# Patient Record
Sex: Male | Born: 1955
Health system: Southern US, Community
[De-identification: ages and names within clinical notes are randomized; demographics above are authoritative.]

## PROBLEM LIST (undated history)

## (undated) DIAGNOSIS — Z860100 Personal history of colon polyps, unspecified: Secondary | ICD-10-CM

## (undated) DIAGNOSIS — C4492 Squamous cell carcinoma of skin, unspecified: Secondary | ICD-10-CM

## (undated) DIAGNOSIS — K219 Gastro-esophageal reflux disease without esophagitis: Secondary | ICD-10-CM

## (undated) DIAGNOSIS — R945 Abnormal results of liver function studies: Secondary | ICD-10-CM

## (undated) DIAGNOSIS — C4491 Basal cell carcinoma of skin, unspecified: Secondary | ICD-10-CM

## (undated) DIAGNOSIS — F419 Anxiety disorder, unspecified: Secondary | ICD-10-CM

## (undated) DIAGNOSIS — K2270 Barrett's esophagus without dysplasia: Secondary | ICD-10-CM

## (undated) DIAGNOSIS — R7989 Other specified abnormal findings of blood chemistry: Secondary | ICD-10-CM

## (undated) DIAGNOSIS — K449 Diaphragmatic hernia without obstruction or gangrene: Secondary | ICD-10-CM

## (undated) DIAGNOSIS — Z8719 Personal history of other diseases of the digestive system: Secondary | ICD-10-CM

## (undated) DIAGNOSIS — R972 Elevated prostate specific antigen [PSA]: Secondary | ICD-10-CM

## (undated) DIAGNOSIS — I739 Peripheral vascular disease, unspecified: Secondary | ICD-10-CM

## (undated) DIAGNOSIS — C61 Malignant neoplasm of prostate: Secondary | ICD-10-CM

## (undated) HISTORY — DX: Barrett's esophagus without dysplasia: K22.70

## (undated) HISTORY — DX: Abnormal results of liver function studies: R94.5

## (undated) HISTORY — DX: Peripheral vascular disease, unspecified: I73.9

## (undated) HISTORY — DX: Malignant neoplasm of prostate: C61

## (undated) HISTORY — PX: LIVER BIOPSY: SHX301

## (undated) HISTORY — DX: Personal history of other diseases of the digestive system: Z87.19

## (undated) HISTORY — DX: Personal history of colonic polyps: Z86.010

## (undated) HISTORY — DX: Personal history of colon polyps, unspecified: Z86.0100

## (undated) HISTORY — DX: Elevated prostate specific antigen (PSA): R97.20

## (undated) HISTORY — DX: Basal cell carcinoma of skin, unspecified: C44.91

## (undated) HISTORY — DX: Anxiety disorder, unspecified: F41.9

## (undated) HISTORY — DX: Gastro-esophageal reflux disease without esophagitis: K21.9

## (undated) HISTORY — DX: Squamous cell carcinoma of skin, unspecified: C44.92

## (undated) HISTORY — DX: Other specified abnormal findings of blood chemistry: R79.89

## (undated) HISTORY — DX: Diaphragmatic hernia without obstruction or gangrene: K44.9

---

## 2003-08-04 ENCOUNTER — Encounter: Admission: RE | Admit: 2003-08-04 | Discharge: 2003-08-04 | Payer: Self-pay | Admitting: Internal Medicine

## 2004-04-14 ENCOUNTER — Ambulatory Visit: Payer: Self-pay | Admitting: Gastroenterology

## 2004-04-19 ENCOUNTER — Encounter: Admission: RE | Admit: 2004-04-19 | Discharge: 2004-04-19 | Payer: Self-pay | Admitting: Gastroenterology

## 2004-04-27 ENCOUNTER — Ambulatory Visit: Payer: Self-pay | Admitting: Gastroenterology

## 2004-11-28 ENCOUNTER — Ambulatory Visit: Payer: Self-pay | Admitting: Gastroenterology

## 2006-03-27 ENCOUNTER — Ambulatory Visit: Payer: Self-pay | Admitting: Internal Medicine

## 2006-04-11 ENCOUNTER — Ambulatory Visit: Payer: Self-pay | Admitting: Gastroenterology

## 2006-05-07 ENCOUNTER — Ambulatory Visit: Payer: Self-pay | Admitting: Gastroenterology

## 2006-05-21 ENCOUNTER — Encounter (INDEPENDENT_AMBULATORY_CARE_PROVIDER_SITE_OTHER): Payer: Self-pay | Admitting: Specialist

## 2006-05-21 ENCOUNTER — Ambulatory Visit: Payer: Self-pay | Admitting: Gastroenterology

## 2006-05-21 DIAGNOSIS — K5730 Diverticulosis of large intestine without perforation or abscess without bleeding: Secondary | ICD-10-CM

## 2006-05-29 ENCOUNTER — Ambulatory Visit: Payer: Self-pay | Admitting: Family Medicine

## 2006-07-10 ENCOUNTER — Ambulatory Visit: Payer: Self-pay | Admitting: Family Medicine

## 2006-07-10 LAB — CONVERTED CEMR LAB
ALT: 94 units/L — ABNORMAL HIGH (ref 0–40)
AST: 57 units/L — ABNORMAL HIGH (ref 0–37)
Albumin: 3.8 g/dL (ref 3.5–5.2)
Alkaline Phosphatase: 134 units/L — ABNORMAL HIGH (ref 39–117)
BUN: 6 mg/dL (ref 6–23)
Bilirubin, Direct: 0.1 mg/dL (ref 0.0–0.3)
CO2: 31 meq/L (ref 19–32)
Calcium: 9.2 mg/dL (ref 8.4–10.5)
Chloride: 110 meq/L (ref 96–112)
Cholesterol: 226 mg/dL (ref 0–200)
Creatinine, Ser: 0.8 mg/dL (ref 0.4–1.5)
Direct LDL: 168.6 mg/dL
GFR calc Af Amer: 131 mL/min
GFR calc non Af Amer: 108 mL/min
Glucose, Bld: 97 mg/dL (ref 70–99)
HDL: 42.2 mg/dL (ref >39.0)
PSA: 2.24 ng/mL (ref 0.10–4.00)
Potassium: 4.3 meq/L (ref 3.5–5.1)
Sodium: 144 meq/L (ref 135–145)
TSH: 1.39 microintl units/mL (ref 0.35–5.50)
Total Bilirubin: 0.8 mg/dL (ref 0.3–1.2)
Total CHOL/HDL Ratio: 5.4
Total Protein: 6.7 g/dL (ref 6.0–8.3)
Triglycerides: 111 mg/dL (ref 0–149)
VLDL: 22 mg/dL (ref 0–40)

## 2006-07-30 ENCOUNTER — Ambulatory Visit: Payer: Self-pay | Admitting: Family Medicine

## 2006-07-30 LAB — CONVERTED CEMR LAB
ALT: 47 units/L — ABNORMAL HIGH (ref 0–40)
AST: 31 units/L (ref 0–37)
Albumin: 4 g/dL (ref 3.5–5.2)
Alkaline Phosphatase: 85 units/L (ref 39–117)
Bilirubin, Direct: 0.1 mg/dL (ref 0.0–0.3)
Iron: 74 ug/dL (ref 42–165)
Saturation Ratios: 22.2 % (ref 20.0–50.0)
Total Bilirubin: 0.6 mg/dL (ref 0.3–1.2)
Total Protein: 6.8 g/dL (ref 6.0–8.3)
Transferrin: 238.2 mg/dL (ref >=212.0)

## 2006-08-30 DIAGNOSIS — F411 Generalized anxiety disorder: Secondary | ICD-10-CM | POA: Insufficient documentation

## 2006-08-30 DIAGNOSIS — G43009 Migraine without aura, not intractable, without status migrainosus: Secondary | ICD-10-CM

## 2006-08-30 DIAGNOSIS — G4700 Insomnia, unspecified: Secondary | ICD-10-CM | POA: Insufficient documentation

## 2006-08-30 DIAGNOSIS — H9190 Unspecified hearing loss, unspecified ear: Secondary | ICD-10-CM | POA: Insufficient documentation

## 2006-08-30 HISTORY — DX: Unspecified hearing loss, unspecified ear: H91.90

## 2006-08-30 HISTORY — DX: Insomnia, unspecified: G47.00

## 2006-08-30 HISTORY — DX: Generalized anxiety disorder: F41.1

## 2006-08-30 HISTORY — DX: Migraine without aura, not intractable, without status migrainosus: G43.009

## 2006-10-15 ENCOUNTER — Ambulatory Visit: Payer: Self-pay | Admitting: Family Medicine

## 2006-10-18 ENCOUNTER — Telehealth (INDEPENDENT_AMBULATORY_CARE_PROVIDER_SITE_OTHER): Payer: Self-pay | Admitting: *Deleted

## 2006-10-18 LAB — CONVERTED CEMR LAB
ALT: 118 units/L — ABNORMAL HIGH (ref 0–53)
AST: 59 units/L — ABNORMAL HIGH (ref 0–37)

## 2006-10-24 ENCOUNTER — Ambulatory Visit: Payer: Self-pay | Admitting: Gastroenterology

## 2006-10-24 LAB — CONVERTED CEMR LAB
Anti Nuclear Antibody(ANA): NEGATIVE
Ceruloplasmin: 36 mg/dL (ref 21–63)
Ferritin: 84.4 ng/mL (ref 22.0–322.0)
HCV Ab: NEGATIVE
Hepatitis B Surface Ag: NEGATIVE
Iron: 77 ug/dL (ref 42–165)
Saturation Ratios: 22.5 % (ref 20.0–50.0)
Transferrin: 244.2 mg/dL (ref >=212.0)

## 2006-10-29 ENCOUNTER — Ambulatory Visit: Payer: Self-pay | Admitting: Gastroenterology

## 2006-11-01 ENCOUNTER — Ambulatory Visit: Payer: Self-pay | Admitting: Family Medicine

## 2006-11-26 ENCOUNTER — Ambulatory Visit: Payer: Self-pay | Admitting: Gastroenterology

## 2006-12-13 ENCOUNTER — Ambulatory Visit (HOSPITAL_COMMUNITY): Admission: RE | Admit: 2006-12-13 | Discharge: 2006-12-13 | Payer: Self-pay | Admitting: Gastroenterology

## 2006-12-13 ENCOUNTER — Encounter (INDEPENDENT_AMBULATORY_CARE_PROVIDER_SITE_OTHER): Payer: Self-pay | Admitting: Family Medicine

## 2006-12-13 ENCOUNTER — Encounter (INDEPENDENT_AMBULATORY_CARE_PROVIDER_SITE_OTHER): Payer: Self-pay | Admitting: Diagnostic Radiology

## 2007-01-10 ENCOUNTER — Encounter (INDEPENDENT_AMBULATORY_CARE_PROVIDER_SITE_OTHER): Payer: Self-pay | Admitting: Family Medicine

## 2007-01-23 ENCOUNTER — Encounter (INDEPENDENT_AMBULATORY_CARE_PROVIDER_SITE_OTHER): Payer: Self-pay | Admitting: Family Medicine

## 2007-01-28 ENCOUNTER — Telehealth (INDEPENDENT_AMBULATORY_CARE_PROVIDER_SITE_OTHER): Payer: Self-pay | Admitting: *Deleted

## 2007-01-29 ENCOUNTER — Ambulatory Visit: Payer: Self-pay | Admitting: Family Medicine

## 2007-03-10 ENCOUNTER — Telehealth (INDEPENDENT_AMBULATORY_CARE_PROVIDER_SITE_OTHER): Payer: Self-pay | Admitting: *Deleted

## 2007-05-27 ENCOUNTER — Encounter (INDEPENDENT_AMBULATORY_CARE_PROVIDER_SITE_OTHER): Payer: Self-pay | Admitting: Family Medicine

## 2007-07-21 ENCOUNTER — Telehealth (INDEPENDENT_AMBULATORY_CARE_PROVIDER_SITE_OTHER): Payer: Self-pay | Admitting: *Deleted

## 2007-07-24 ENCOUNTER — Ambulatory Visit: Payer: Self-pay | Admitting: Internal Medicine

## 2007-08-01 ENCOUNTER — Ambulatory Visit: Payer: Self-pay | Admitting: Gastroenterology

## 2007-08-01 LAB — CONVERTED CEMR LAB
ALT: 127 units/L — ABNORMAL HIGH (ref 0–53)
AST: 70 units/L — ABNORMAL HIGH (ref 0–37)
Albumin: 4.2 g/dL (ref 3.5–5.2)
Alkaline Phosphatase: 83 units/L (ref 39–117)
BUN: 8 mg/dL (ref 6–23)
Basophils Absolute: 0 10*3/uL (ref 0.0–0.1)
Basophils Relative: 0.1 % (ref 0.0–1.0)
Bilirubin, Direct: 0.1 mg/dL (ref 0.0–0.3)
CO2: 28 meq/L (ref 19–32)
Calcium: 9.2 mg/dL (ref 8.4–10.5)
Chloride: 106 meq/L (ref 96–112)
Creatinine, Ser: 0.9 mg/dL (ref 0.4–1.5)
Eosinophils Absolute: 0.1 10*3/uL (ref 0.0–0.7)
Eosinophils Relative: 1.2 % (ref 0.0–5.0)
GFR calc Af Amer: 114 mL/min
GFR calc non Af Amer: 94 mL/min
Glucose, Bld: 91 mg/dL (ref 70–99)
HCT: 48.3 % (ref 39.0–52.0)
Hemoglobin: 16.6 g/dL (ref 13.0–17.0)
Lymphocytes Relative: 30 % (ref 12.0–46.0)
MCHC: 34.3 g/dL (ref 30.0–36.0)
MCV: 92.6 fL (ref 78.0–100.0)
Monocytes Absolute: 0.6 10*3/uL (ref 0.1–1.0)
Monocytes Relative: 9.1 % (ref 3.0–12.0)
Neutro Abs: 3.8 10*3/uL (ref 1.4–7.7)
Neutrophils Relative %: 59.6 % (ref 43.0–77.0)
Platelets: 363 10*3/uL (ref 150–400)
Potassium: 4.2 meq/L (ref 3.5–5.1)
RBC: 5.22 M/uL (ref 4.22–5.81)
RDW: 12.2 % (ref 11.5–14.6)
Sodium: 140 meq/L (ref 135–145)
TSH: 1.86 microintl units/mL (ref 0.35–5.50)
Total Bilirubin: 0.7 mg/dL (ref 0.3–1.2)
Total Protein: 7.1 g/dL (ref 6.0–8.3)
WBC: 6.5 10*3/uL (ref 4.5–10.5)

## 2007-09-02 ENCOUNTER — Ambulatory Visit: Payer: Self-pay | Admitting: Gastroenterology

## 2007-09-08 LAB — CONVERTED CEMR LAB
ALT: 87 units/L — ABNORMAL HIGH (ref 0–53)
AST: 40 units/L — ABNORMAL HIGH (ref 0–37)
Albumin: 4 g/dL (ref 3.5–5.2)
Alkaline Phosphatase: 95 units/L (ref 39–117)
Bilirubin, Direct: 0.1 mg/dL (ref 0.0–0.3)
Total Bilirubin: 0.9 mg/dL (ref 0.3–1.2)
Total Protein: 6.7 g/dL (ref 6.0–8.3)

## 2007-09-19 ENCOUNTER — Telehealth: Payer: Self-pay | Admitting: Gastroenterology

## 2007-09-22 ENCOUNTER — Ambulatory Visit: Payer: Self-pay | Admitting: Gastroenterology

## 2007-09-26 ENCOUNTER — Telehealth: Payer: Self-pay | Admitting: Gastroenterology

## 2007-10-01 ENCOUNTER — Telehealth: Payer: Self-pay | Admitting: Gastroenterology

## 2007-10-17 ENCOUNTER — Ambulatory Visit: Payer: Self-pay | Admitting: Gastroenterology

## 2007-10-27 ENCOUNTER — Telehealth (INDEPENDENT_AMBULATORY_CARE_PROVIDER_SITE_OTHER): Payer: Self-pay | Admitting: *Deleted

## 2007-11-10 ENCOUNTER — Inpatient Hospital Stay (HOSPITAL_COMMUNITY): Admission: RE | Admit: 2007-11-10 | Discharge: 2007-11-12 | Payer: Self-pay | Admitting: Surgery

## 2007-11-10 ENCOUNTER — Encounter (INDEPENDENT_AMBULATORY_CARE_PROVIDER_SITE_OTHER): Payer: Self-pay | Admitting: Surgery

## 2007-11-10 HISTORY — PX: OTHER SURGICAL HISTORY: SHX169

## 2007-11-28 ENCOUNTER — Encounter: Payer: Self-pay | Admitting: Family Medicine

## 2007-12-02 ENCOUNTER — Telehealth: Payer: Self-pay | Admitting: Gastroenterology

## 2007-12-30 ENCOUNTER — Ambulatory Visit: Payer: Self-pay | Admitting: Gastroenterology

## 2008-01-06 LAB — CONVERTED CEMR LAB
ALT: 93 units/L — ABNORMAL HIGH (ref 0–53)
AST: 45 units/L — ABNORMAL HIGH (ref 0–37)
Albumin: 4.2 g/dL (ref 3.5–5.2)
Alkaline Phosphatase: 99 units/L (ref 39–117)
Bilirubin, Direct: 0.1 mg/dL (ref 0.0–0.3)
Total Bilirubin: 0.6 mg/dL (ref 0.3–1.2)
Total Protein: 7.1 g/dL (ref 6.0–8.3)

## 2008-07-05 ENCOUNTER — Telehealth: Payer: Self-pay | Admitting: Gastroenterology

## 2008-07-09 ENCOUNTER — Ambulatory Visit: Payer: Self-pay | Admitting: Gastroenterology

## 2008-07-12 LAB — CONVERTED CEMR LAB
ALT: 76 units/L — ABNORMAL HIGH (ref 0–53)
AST: 40 units/L — ABNORMAL HIGH (ref 0–37)
Albumin: 4.1 g/dL (ref 3.5–5.2)
Alkaline Phosphatase: 83 units/L (ref 39–117)
Bilirubin, Direct: 0.1 mg/dL (ref 0.0–0.3)
Total Bilirubin: 0.8 mg/dL (ref 0.3–1.2)
Total Protein: 6.9 g/dL (ref 6.0–8.3)

## 2008-09-16 ENCOUNTER — Ambulatory Visit: Payer: Self-pay | Admitting: Family Medicine

## 2008-10-28 ENCOUNTER — Ambulatory Visit: Payer: Self-pay | Admitting: Family Medicine

## 2009-03-07 ENCOUNTER — Telehealth: Payer: Self-pay | Admitting: Family Medicine

## 2009-11-15 ENCOUNTER — Encounter (INDEPENDENT_AMBULATORY_CARE_PROVIDER_SITE_OTHER): Payer: Self-pay | Admitting: *Deleted

## 2009-11-15 ENCOUNTER — Ambulatory Visit: Payer: Self-pay | Admitting: Gastroenterology

## 2009-11-15 DIAGNOSIS — K7689 Other specified diseases of liver: Secondary | ICD-10-CM

## 2009-11-15 HISTORY — DX: Other specified diseases of liver: K76.89

## 2009-11-15 LAB — CONVERTED CEMR LAB
ALT: 65 units/L — ABNORMAL HIGH (ref 0–53)
AST: 38 units/L — ABNORMAL HIGH (ref 0–37)
Albumin: 4.3 g/dL (ref 3.5–5.2)
Alkaline Phosphatase: 80 units/L (ref 39–117)
BUN: 12 mg/dL (ref 6–23)
Basophils Absolute: 0 10*3/uL (ref 0.0–0.1)
Basophils Relative: 0.6 % (ref 0.0–3.0)
Bilirubin, Direct: 0.1 mg/dL (ref 0.0–0.3)
CO2: 28 meq/L (ref 19–32)
Calcium: 9.5 mg/dL (ref 8.4–10.5)
Chloride: 103 meq/L (ref 96–112)
Creatinine, Ser: 0.8 mg/dL (ref 0.4–1.5)
Eosinophils Absolute: 0.2 10*3/uL (ref 0.0–0.7)
Eosinophils Relative: 2.6 % (ref 0.0–5.0)
Ferritin: 75 ng/mL (ref 22.0–322.0)
Folate: 10.4 ng/mL
GFR calc non Af Amer: 103.73 mL/min (ref >60)
Glucose, Bld: 89 mg/dL (ref 70–99)
HCT: 46.2 % (ref 39.0–52.0)
Hemoglobin: 15.8 g/dL (ref 13.0–17.0)
Iron: 108 ug/dL (ref 42–165)
Lymphocytes Relative: 28.4 % (ref 12.0–46.0)
Lymphs Abs: 1.8 10*3/uL (ref 0.7–4.0)
MCHC: 34.1 g/dL (ref 30.0–36.0)
MCV: 93 fL (ref 78.0–100.0)
Monocytes Absolute: 0.6 10*3/uL (ref 0.1–1.0)
Monocytes Relative: 10.4 % (ref 3.0–12.0)
Neutro Abs: 3.6 10*3/uL (ref 1.4–7.7)
Neutrophils Relative %: 58 % (ref 43.0–77.0)
Platelets: 329 10*3/uL (ref 150.0–400.0)
Potassium: 4.4 meq/L (ref 3.5–5.1)
RBC: 4.97 M/uL (ref 4.22–5.81)
RDW: 13.2 % (ref 11.5–14.6)
Saturation Ratios: 30.4 % (ref 20.0–50.0)
Sodium: 141 meq/L (ref 135–145)
TSH: 1.2 microintl units/mL (ref 0.35–5.50)
Total Bilirubin: 0.6 mg/dL (ref 0.3–1.2)
Total Protein: 7 g/dL (ref 6.0–8.3)
Transferrin: 254.1 mg/dL (ref 212.0–360.0)
Vitamin B-12: 590 pg/mL (ref 211–911)
WBC: 6.2 10*3/uL (ref 4.5–10.5)

## 2009-11-23 ENCOUNTER — Encounter: Payer: Self-pay | Admitting: Gastroenterology

## 2009-11-30 ENCOUNTER — Ambulatory Visit: Payer: Self-pay | Admitting: Gastroenterology

## 2009-12-01 ENCOUNTER — Telehealth: Payer: Self-pay | Admitting: Gastroenterology

## 2009-12-02 ENCOUNTER — Encounter: Payer: Self-pay | Admitting: Gastroenterology

## 2009-12-02 LAB — HM COLONOSCOPY

## 2009-12-07 ENCOUNTER — Ambulatory Visit (HOSPITAL_COMMUNITY): Admission: RE | Admit: 2009-12-07 | Discharge: 2009-12-07 | Payer: Self-pay | Admitting: Gastroenterology

## 2009-12-21 DIAGNOSIS — K449 Diaphragmatic hernia without obstruction or gangrene: Secondary | ICD-10-CM

## 2009-12-21 DIAGNOSIS — K2270 Barrett's esophagus without dysplasia: Secondary | ICD-10-CM | POA: Insufficient documentation

## 2009-12-21 HISTORY — DX: Diaphragmatic hernia without obstruction or gangrene: K44.9

## 2009-12-22 ENCOUNTER — Ambulatory Visit: Payer: Self-pay | Admitting: Gastroenterology

## 2010-01-18 ENCOUNTER — Telehealth (INDEPENDENT_AMBULATORY_CARE_PROVIDER_SITE_OTHER): Payer: Self-pay | Admitting: *Deleted

## 2010-01-19 ENCOUNTER — Telehealth (INDEPENDENT_AMBULATORY_CARE_PROVIDER_SITE_OTHER): Payer: Self-pay | Admitting: *Deleted

## 2010-02-14 ENCOUNTER — Encounter: Payer: Self-pay | Admitting: Internal Medicine

## 2010-02-14 ENCOUNTER — Ambulatory Visit: Payer: Self-pay | Admitting: Internal Medicine

## 2010-02-16 ENCOUNTER — Ambulatory Visit: Payer: Self-pay | Admitting: Internal Medicine

## 2010-02-22 LAB — CONVERTED CEMR LAB
Cholesterol: 206 mg/dL — ABNORMAL HIGH (ref 0–200)
Direct LDL: 150.4 mg/dL
HDL: 43.5 mg/dL (ref >39.00)
PSA: 2.19 ng/mL (ref 0.10–4.00)
Total CHOL/HDL Ratio: 5
Triglycerides: 115 mg/dL (ref 0.0–149.0)
VLDL: 23 mg/dL (ref 0.0–40.0)

## 2010-05-02 NOTE — Assessment & Plan Note (Signed)
Summary: oral thrush   ph   Vital Signs:  Patient Profile:   55 Years Old Male Weight:      163.50 pounds Temp:     98.2 degrees F oral Pulse rate:   72 / minute Resp:     14 per minute BP sitting:   140 / 80  (right arm)  Pt. in pain?   no  Vitals Entered By: Ardyth Man (January 29, 2007 1:31 PM)                  Chief Complaint:  Thrush in mouth since last Thursday when finishing abx for diverticulitis.  History of Present Illness: Whitish plaques on tongue. Had previously after being placed on antibiotic. No fever, sore throat, or trouble swallowing        Physical Exam  Mouth:     Whitish plaque on tongue and buccal mucosa    Impression & Recommendations:  Problem # 1:  CANDIDIASIS, ORAL (ICD-112.0) Nystatin solution F/u if no improvement  or worsens  Complete Medication List: 1)  Nexium 40 Mg Cpdr (Esomeprazole magnesium) 2)  Alprazolam 0.5 Mg Tabs (Alprazolam) .... Take 1 tablet by mouth twice a day 3)  Zolpidem Tartrate 10 Mg Tabs (Zolpidem tartrate) .Marland Kitchen.. 1 tablet by mouth at bedtime 4)  Nystatin 100000 Unit/ml Susp (Nystatin) .... 5ml swish and swallow qid for 10 days. 5)  Nystatin 100000 Unit/ml Susp (Nystatin) .... 5ml qid for 10 days     Prescriptions: NYSTATIN 100000 UNIT/ML  SUSP (NYSTATIN) 5ml QID for 10 days  #QS x 1   Entered and Authorized by:   Leanne Chang MD   Signed by:   Leanne Chang MD on 01/29/2007   Method used:   Print then Give to Patient   RxID:   [id number]  ]

## 2010-05-02 NOTE — Letter (Signed)
Summary: [facility] Instructions  [facility]  [address] [place], [place] [address]   Phone: [phone]  Fax: [phone]       Ryan Smith    25-Dec-1955    MRN: [id number]        Procedure Day Dorna Bloom: Wednesday, 11/30/09     Arrival Time: 1:30      Procedure Time: 2:30     Location of Procedure:                    _X _  [facility] (4th Floor)                         PREPARATION FOR COLONOSCOPY WITH MOVIPREP   Starting 5 days prior to your procedure 11/25/09 do not eat nuts, seeds, popcorn, corn, beans, peas,  salads, or any raw vegetables.  Do not take any fiber supplements (e.g. Metamucil, Citrucel, and Benefiber).  THE DAY BEFORE YOUR PROCEDURE         DATE: 11/29/09     DAY: Tuesday  1.  Drink clear liquids the entire day-NO SOLID FOOD  2.  Do not drink anything colored red or purple.  Avoid juices with pulp.  No orange juice.  3.  Drink at least 64 oz. (8 glasses) of fluid/clear liquids during the day to prevent dehydration and help the prep work efficiently.  CLEAR LIQUIDS INCLUDE: Water Jello Ice Popsicles Tea (sugar ok, no milk/cream) Powdered fruit flavored drinks Coffee (sugar ok, no milk/cream) Gatorade Juice: apple, white grape, white cranberry  Lemonade Clear bullion, consomm, broth Carbonated beverages (any kind) Strained chicken noodle soup Hard Candy                             4.  In the morning, mix first dose of MoviPrep solution:    Empty 1 Pouch A and 1 Pouch B into the disposable container    Add lukewarm drinking water to the top line of the container. Mix to dissolve    Refrigerate (mixed solution should be used within 24 hrs)  5.  Begin drinking the prep at 5:00 p.m. The MoviPrep container is divided by 4 marks.   Every 15 minutes drink the solution down to the next mark (approximately 8 oz) until the full liter is complete.   6.  Follow completed prep with 16 oz of clear liquid of your choice (Nothing  red or purple).  Continue to drink clear liquids until bedtime.  7.  Before going to bed, mix second dose of MoviPrep solution:    Empty 1 Pouch A and 1 Pouch B into the disposable container    Add lukewarm drinking water to the top line of the container. Mix to dissolve    Refrigerate  THE DAY OF YOUR PROCEDURE      DATE: 11/30/09 DAY: _  _  Beginning at 9:30 a.m. (5 hours before procedure):         1. Every 15 minutes, drink the solution down to the next mark (approx 8 oz) until the full liter is complete.  2. Follow completed prep with 16 oz. of clear liquid of your choice.    3. You may drink clear liquids until 12:30 pm (2 HOURS BEFORE PROCEDURE).   MEDICATION INSTRUCTIONS  Unless otherwise instructed, you should take regular prescription medications with a small sip of water   as early  as possible the morning of your procedure.                 OTHER INSTRUCTIONS  You will need a responsible adult at least 56 years of age to accompany you and drive you home.   This person must remain in the waiting room during your procedure.  Wear loose fitting clothing that is easily removed.  Leave jewelry and other valuables at home.  However, you may wish to bring a book to read or  an iPod/MP3 player to listen to music as you wait for your procedure to start.  Remove all body piercing jewelry and leave at home.  Total time from sign-in until discharge is approximately 2-3 hours.  You should go home directly after your procedure and rest.  You can resume normal activities the  day after your procedure.  The day of your procedure you should not:   Drive   Make legal decisions   Operate machinery   Drink alcohol   Return to work  You will receive specific instructions about eating, activities and medications before you leave.    The above instructions have been reviewed and explained to me by   _______________________    I fully understand and can  verbalize these instructions _____________________________ Date _________

## 2010-05-02 NOTE — Letter (Signed)
[place] [facility] [place]   Im[place] By: Freddy Jaksch 01/23/2007 12:18:27  _____________________________________________________________________  External Attachment:    Type:   Image     Comment:   External Document

## 2010-05-02 NOTE — Consult Note (Signed)
Summary: [facility]   Imported By: Lanelle Bal 12/05/2007 11:47:27  _____________________________________________________________________  External Attachment:    Type:   Image     Comment:   External Document

## 2010-05-02 NOTE — Progress Notes (Signed)
Summary: rx refill   Phone Note Refill Request Call back at [phone]   Refills Requested: Medication #1:  ZOLPIDEM TARTRATE 10 MG TABS 1 tablet by mouth at bedtime pt is out of this medication. pt is off work today and wanted to know if he could come today to get the medication. pt aware of our 24 to 48 hour policy.   Method Requested: Pick up at Office Initial call taken by: Charolette Child,  March 10, 2007 9:57 AM  Follow-up for Phone Call        Pt. aware to p/u rx up front ...................................................................Ardyth Man  March 10, 2007 10:28 AM  Follow-up by: Ardyth Man,  March 10, 2007 10:28 AM      Prescriptions: ZOLPIDEM TARTRATE 10 MG TABS (ZOLPIDEM TARTRATE) 1 tablet by mouth at bedtime  #30 x 2   Entered by:   Ardyth Man   Authorized by:   Leanne Chang MD   Signed by:   Ardyth Man on 03/10/2007   Method used:   Print then Give to Patient   RxID:   [id number]

## 2010-05-02 NOTE — Procedures (Signed)
Summary: EGD   EGD  Procedure date:  05/21/2006  Findings:      Location: [facility]   Patient Name: Ryan Smith, Ryan Smith MRN: [id number] Procedure Procedures: Panendoscopy (EGD) CPT: 43235.    with biopsy(s)/brushing(s). CPT: D1846139.  Personnel: Endoscopist: Vania Rea. Jarold Motto, MD.  Exam Location: Exam performed in Outpatient Clinic. Outpatient  Patient Consent: Procedure, Alternatives, Risks and Benefits discussed, consent obtained, from patient. Consent was obtained by the RN.  Indications Symptoms: Reflux symptoms for 6-10 yrs, occurring 3-6 times/wk.  History  Current Medications: Patient is not currently taking Coumadin.  Pre-Exam Physical: Performed May 21, 2006  Cardio-pulmonary exam, Abdominal exam, Extremity exam, Mental status exam WNL.  Comments: Pt. history reviewed/updated, physical exam performed prior to initiation of sedation?yes Exam Exam Info: Maximum depth of insertion Duodenum, intended Duodenum. Patient position: on left side. Duration of exam: 10 minutes. Vocal cords visualized. Gastric retroflexion performed. Images taken. ASA Classification: I. Tolerance: excellent.  Sedation Meds: Patient assessed and found to be appropriate for moderate (conscious) sedation. Cetacaine Spray 2 sprays given aerosolized. Versed 2 mg. given IV.  Monitoring: BP and pulse monitoring done. Oximetry used. Supplemental O2 given at 2 Liters.  Instrument(s): GIF 160. Serial [id number].   Findings - Normal: Proximal Esophagus to Distal Esophagus. Not Seen: Tumor. Barrett's esophagus. Esophageal inflammation. Mucosal abnormality. Foreign body. Varices.  - HIATAL HERNIA: Prolapsing, 3 cms. in length.  - OTHER FINDING: in Distal Esophagus. Biopsy/Other Finding taken. Comments: Irregular Z-line..R/O Barrett's mucosa...  - OTHER FINDING: in Fundus. Comments: Scattered hyperplasic fundal polys noted.Marland KitchenMarland KitchenOtherwise appears normal.  - Normal: Body to Duodenal 2nd  Portion. Not Seen: Ulcer. Mucosal abnormality. AVM's. Polyp.   Assessment  Comments: Chronic GERD.Marland KitchenMarland KitchenR/O Barrett's mucosa.... Events  Unplanned Intervention: No unplanned interventions were required.  Plans Medication(s): Await pathology. PPI: Esomeprazole/Nexium 40 mg QAM, starting May 21, 2006 for indefinitely.   Patient Education: Patient given standard instructions for: Reflux.  Disposition: After procedure patient sent to recovery. After recovery patient sent home.  Scheduling: Await pathology to schedule patient. Follow-up prn.     SP Surgical Pathology - STATUS: Final             By: Morrie Sheldon,       Perform Date: [phone] 00:01  Ordered By: Talbert Forest Date: 19Feb08 14:53  Facility: [facility]                               Department: CPATH  Service Report Text  [facility]   [address] [address]   [place], [place] [address]   Telephone [phone] or [phone] Fax [phone]    REPORT OF SURGICAL PATHOLOGY    Case #: [id number]   Patient Name: Ryan Smith, Ryan Smith.   Office Chart Number: [id number]    MRN: [id number]   Pathologist: Corinthian Mizrahi. Luisa Hart, MD   DOB/Age 07-Nov-1955 (Age: 55) Gender: M   Date Taken: 05/21/2006   Date Received: 05/21/2006    FINAL DIAGNOSIS    ***MICROSCOPIC EXAMINATION AND DIAGNOSIS***    1. COLON, SIGMOID POLYPS: TUBULAR ADENOMAS. NO HIGH GRADE   DYSPLASIA OR MALIGNANCY IDENTIFIED.    2. COLON, SIGMOID POLYP: TUBULOVILLOUS ADENOMA. NO HIGH GRADE   DYSPLASIA OR MALIGNANCY IDENTIFIED.    3. ESOPHAGUS, BIOPSIES: GASTROESOPHAGEAL JUNCTION MUCOSA WITH   MILD INFLAMMATION CONSISTENT WITH GASTROESOPHAGEAL REFLUX. NO   INTESTINAL  METAPLASIA, DYSPLASIA OR MALIGNANCY IDENTIFIED.    COMMENT   3. An Alcian Blue stain is performed to determine the presence of   intestinal metaplasia (goblet cell metaplasia). No intestinal   metaplasia (goblet cell metaplasia) is identified with the Alcian    Blue stain. The control stained appropriately. (JP:jy) 05/22/06    jy   Date Reported: 05/22/2006 Beulah Gandy. Luisa Hart, MD   *** Electronically Signed Out By JDP ***    Clinical information   1, 2. R/O adenoma   3. R/O Barrett' s (caf)    specimen(s) obtained   1: Colon, polyp(s), sigmoid   2: Colon, polyp(s), sigmoid   3: Esophagus, biopsy    Gross Description   1. Received in formalin are two semifirm, glistening, tan to red   brown polypoid mucosal nodules which measure 0.5 and 0.8 cm in   length. The larger fragment is sectioned and submitted   separately. All tissue is submitted. Two blocks.   Block Summary:   Block A = larger polyp section.   Block B = smaller polyp.    2. Received in formalin is a soft, red brown polypoid nodule,   1.2 x 0.8 x 0.8 cm. It has a 0.3 cm stalk. The base of the   polyp is subsequently inked black. It is sectioned and entirely   submitted. One block.    3. Received in formalin are tan, soft tissue fragments that are   submitted in toto. Number: 5.   Size: 0.1 to 0.2 cm. One block. (TB:cdc:05/21/06)    cc/   This report was created from the original endoscopy report, which was reviewed and signed by the above listed endoscopist.

## 2010-05-02 NOTE — Assessment & Plan Note (Signed)
Summary: ACID REFLUX WORSENING...AS.    History of Present Illness Visit Type: Follow-up Visit Primary GI MD: Sheryn Bison MD FACP FAGA Primary Provider: Loreen Freud, DO  Requesting Provider: na Chief Complaint: Worsening GERD  History of Present Illness:   55 year old Caucasian male who is status post laproscopic left hemicolectomy by Dr. gross 2 years ago because of recurrent diverticulitis. He also has a history of recurrent adenomatous colon polyps is due for followup exam.  He has chronic acid reflux confirmed by endoscopy in 2008. He's had worsening of his reflux symptoms recently with burning substernal chest pain despite daily Nexium therapy. He denies true dysphagia, nausea vomiting, or any specific hepatobiliary complaints. He does have chronically abnormal liver function tests of unexplained etiology with a negative liver biopsy in 2008. Previous examination for cholelithiasis has been unremarkable.  He has nightly chest pain and regurgitation but no hepatobiliary or lower GI complaints. He does not smoke or abuse ethanol or cigarettes. Actually he does use 2-3 glasses of wine a day   GI Review of Systems    Reports acid reflux and  heartburn.      Denies abdominal pain, belching, bloating, chest pain, dysphagia with liquids, dysphagia with solids, loss of appetite, nausea, vomiting, vomiting blood, weight loss, and  weight gain.        Denies anal fissure, black tarry stools, change in bowel habit, constipation, diarrhea, diverticulosis, fecal incontinence, heme positive stool, hemorrhoids, irritable bowel syndrome, jaundice, light color stool, liver problems, rectal bleeding, and  rectal pain.    Current Medications (verified): 1)  Nexium 40 Mg Cpdr (Esomeprazole Magnesium) .... One By Mouth Once Daily 2)  Pristiq 50 Mg Xr24h-Tab (Desvenlafaxine Succinate) .Marland Kitchen.. 1 By Mouth Once Daily 3)  Alprazolam 0.5 Mg Tabs (Alprazolam) .Marland Kitchen.. 1 By Mouth Three Times A Day As Needed 4)   Ambien Cr 12.5 Mg Cr-Tabs (Zolpidem Tartrate) .Marland Kitchen.. 1 By Mouth At Bedtime As Needed 5)  Aspirin 81 Mg Tbec (Aspirin) .... As Needed 6)  Magnesium 300 Mg Caps (Magnesium) .... One By Mouth At Bedtime Once Daily 7)  Red Yeast Rice Extract 600 Mg Caps (Red Yeast Rice Extract) .... One Capsule By Mouth Once Daily  Allergies (verified): No Known Drug Allergies  Past History:  Past medical, surgical, family and social histories (including risk factors) reviewed for relevance to current acute and chronic problems.  Past Medical History: Reviewed history from 08/30/2006 and no changes required. Anxiety Colonic polyps, hx of Diverticulitis, hx of GERD  Past Surgical History: Laparoscopically-assisted Sigmoid Colectomy---11-10-2007  Family History: Reviewed history and no changes required. No FH of Colon Cancer: Family History of Esophageal Cancer:Father   Social History: Reviewed history and no changes required. Vertellus Married Patient currently smokes.  Alcohol Use - yes: 2-3 glasses of wine or beers daily  Daily Caffeine Use: 1 cup of coffee daily  Illicit Drug Use - no Smoking Status:  current Drug Use:  no  Review of Systems       The patient complains of anxiety-new.  The patient denies allergy/sinus, anemia, arthritis/joint pain, back pain, blood in urine, breast changes/lumps, change in vision, confusion, cough, coughing up blood, depression-new, fainting, fatigue, fever, headaches-new, hearing problems, heart murmur, heart rhythm changes, itching, muscle pains/cramps, night sweats, nosebleeds, shortness of breath, skin rash, sleeping problems, sore throat, swelling of feet/legs, swollen lymph glands, thirst - excessive, urination - excessive, urination changes/pain, urine leakage, vision changes, and voice change.    Vital Signs:  Patient profile:  55 year old male Height:      70 inches Weight:      176 pounds BMI:     25.34 BSA:     1.98 Pulse rate:   68 /  minute Pulse rhythm:   regular BP sitting:   136 / 82  (left arm) Cuff size:   regular  Vitals Entered By: Ok Anis CMA (November 15, 2009 11:25 AM)  Physical Exam  General:  Well developed, well nourished, no acute distress.healthy appearing.  healthy appearing.   Head:  Normocephalic and atraumatic. Eyes:  PERRLA, no icterus.exam deferred to patient's ophthalmologist.  exam deferred to patient's ophthalmologist.   Neck:  Supple; no masses or thyromegaly. Lungs:  Clear throughout to auscultation. Heart:  Regular rate and rhythm; no murmurs, rubs,  or bruits. Abdomen:  Soft, nontender and nondistended. No masses, hepatosplenomegaly or hernias noted. Normal bowel sounds. Rectal:  deferred until time of colonoscopy.   Msk:  Symmetrical with no gross deformities. Normal posture. Extremities:  No clubbing, cyanosis, edema or deformities noted. Neurologic:  Alert and  oriented x4;  grossly normal neurologically. Cervical Nodes:  No significant cervical adenopathy. Psych:  Alert and cooperative. Normal mood and affect.   Impression & Recommendations:  Problem # 1:  CHEST PAIN (ICD-786.50) Assessment Improved Probable worsening GERD from large hiatal hernia and he may need surgical repair. We will increase his Nexium to b.i.d. dosage with standard antireflux maneuvers, repeat his liver function tests and endoscopy exam. Also will check CDT level per his history of ethanol intake and unexplained etiology of abnormal liver enzymes.  Orders: TLB-CBC Platelet - w/Differential (85025-CBCD) TLB-BMP (Basic Metabolic Panel-BMET) (80048-METABOL) TLB-Hepatic/Liver Function Pnl (80076-HEPATIC) TLB-TSH (Thyroid Stimulating Hormone) (84443-TSH) TLB-B12, Serum-Total ONLY (10932-T55) TLB-Ferritin (82728-FER) TLB-Folic Acid (Folate) (82746-FOL) TLB-IBC Pnl (Iron/FE;Transferrin) (83550-IBC) TLB-B12, Serum-Total ONLY (73220-U54) TLB-Ferritin (82728-FER) TLB-Folic Acid (Folate) (82746-FOL) TLB-IBC  Pnl (Iron/FE;Transferrin) (83550-IBC) T-CDT (carbohydrate deficient transferrin) (27062-37628)  Problem # 2:  DIVERTICULOSIS OF COLON (ICD-562.10) Assessment: Improved No lower gastrointestinal problems and sigmoid resection. He did have multiple adenomatous colon polyps and is due for followup colonoscopy exam.  Problem # 3:  COLONIC POLYPS, HX OF (ICD-V12.72) Assessment: Unchanged  Orders: TLB-CBC Platelet - w/Differential (85025-CBCD) TLB-BMP (Basic Metabolic Panel-BMET) (80048-METABOL) TLB-Hepatic/Liver Function Pnl (80076-HEPATIC) TLB-TSH (Thyroid Stimulating Hormone) (84443-TSH) TLB-B12, Serum-Total ONLY (31517-O16) TLB-Ferritin (82728-FER) TLB-Folic Acid (Folate) (82746-FOL) TLB-IBC Pnl (Iron/FE;Transferrin) (83550-IBC) TLB-B12, Serum-Total ONLY (07371-G62) TLB-Ferritin (82728-FER) TLB-Folic Acid (Folate) (82746-FOL) TLB-IBC Pnl (Iron/FE;Transferrin) (83550-IBC)  Problem # 4:  OTHER SPECIFIED DISORDERS OF LIVER (ICD-573.8) Assessment: Unchanged check labs, liver function, anemia profile, and consider further hepatic workup if indicated.  Patient Instructions: 1)  Please go to the basement for lab work. 2)  Increase Nexium to two times a day. 3)  You are scheduled for an upper endoscopy and a colonoscopy. 4)  The medication list was reviewed and reconciled.  All changed / newly prescribed medications were explained.  A complete medication list was provided to the patient / caregiver. 5)  Copy sent to : Dr. Loreen Freud and Dr. Prince Solian  6)  Please continue current medications.  7)  Colonoscopy and Flexible Sigmoidoscopy brochure given.  8)  Conscious Sedation brochure given.  9)  Upper Endoscopy brochure given.  10)  Avoid foods high in acid content ( tomatoes, citrus juices, spicy foods) . Avoid eating within 3 to 4 hours of lying down or before exercising. Do not over eat; try smaller more frequent meals. Elevate head of bed four inches when  sleeping.   Appended  Document: ACID REFLUX WORSENING...AS.    Clinical Lists Changes  Medications: Added new medication of MOVIPREP 100 GM  SOLR (PEG-KCL-NACL-NASULF-NA ASC-C) As per prep instructions. - Signed Changed medication from NEXIUM 40 MG CPDR (ESOMEPRAZOLE MAGNESIUM) one by mouth once daily to NEXIUM 40 MG CPDR (ESOMEPRAZOLE MAGNESIUM) one by mouth bid - Signed Rx of MOVIPREP 100 GM  SOLR (PEG-KCL-NACL-NASULF-NA ASC-C) As per prep instructions.;  #1 x 0;  Signed;  Entered by: Ashok Cordia RN;  Authorized by: Mardella Layman MD [facility];  Method used: Electronically to [facility] [phone]*, [address], [place], [place]  [address], Ph: [phone], Fax: [phone] Rx of NEXIUM 40 MG CPDR (ESOMEPRAZOLE MAGNESIUM) one by mouth bid;  #60 x 6;  Signed;  Entered by: Ashok Cordia RN;  Authorized by: Mardella Layman MD [facility];  Method used: Electronically to [facility] [phone]*, [address] [place], [place]  [address], Ph: [phone], Fax: [phone] Orders: Added new Test order of Colon/Endo (Colon/Endo) - Signed    Prescriptions: NEXIUM 40 MG CPDR (ESOMEPRAZOLE MAGNESIUM) one by mouth bid  #60 x 6   Entered by:   Ashok Cordia RN   Authorized by:   Mardella Layman MD [facility]   Signed by:   Ashok Cordia RN on 11/15/2009   Method used:   Electronically to        [facility] [phone]* (retail)       [address]       [place], [place]  [address]       Ph: [phone]       Fax: [phone]   RxID:   [id number] MOVIPREP 100 GM  SOLR (PEG-KCL-NACL-NASULF-NA ASC-C) As per prep instructions.  #1 x 0   Entered by:   Ashok Cordia RN   Authorized by:   Mardella Layman MD [facility]   Signed by:   Ashok Cordia RN on 11/15/2009   Method used:   Electronically to        [facility] [phone]* (retail)       [address]       [place], [place]  [address]       Ph: [phone]       Fax: [phone]   RxID:   [id number]

## 2010-05-02 NOTE — Assessment & Plan Note (Signed)
Summary: Post proc, [place], dfs    History of Present Illness Visit Type: Follow-up Visit Primary GI MD: Sheryn Bison MD FACP FAGA Primary Provider: Loreen Freud, DO  Requesting Provider: na Chief Complaint: Follow up from procedures and U/S. No GI complaints History of Present Illness:   Ryan Smith is currently asymptomatic on  Nexium 40 mg twice a day.Recent endoscopy showed a very short segment of Barrett's mucosa. Colonoscopy was unremarkable except for evidence of a previous partial colectomy and a small hyperplastic polyp. Patient is status post surgical treatment of diverticulitis.He denies lower gastrointestinal complaints at this time. His appetite is good and his weight is stable.   GI Review of Systems      Denies abdominal pain, acid reflux, belching, bloating, chest pain, dysphagia with liquids, dysphagia with solids, heartburn, loss of appetite, nausea, vomiting, vomiting blood, weight loss, and  weight gain.        Denies anal fissure, black tarry stools, change in bowel habit, constipation, diarrhea, diverticulosis, fecal incontinence, heme positive stool, hemorrhoids, irritable bowel syndrome, jaundice, light color stool, liver problems, rectal bleeding, and  rectal pain.    Current Medications (verified): 1)  Nexium 40 Mg Cpdr (Esomeprazole Magnesium) .... One By Mouth Bid 2)  Pristiq 50 Mg Xr24h-Tab (Desvenlafaxine Succinate) .Marland Kitchen.. 1 By Mouth Once Daily 3)  Magnesium 300 Mg Caps (Magnesium) .... One By Mouth At Bedtime Once Daily 4)  Red Yeast Rice Extract 600 Mg Caps (Red Yeast Rice Extract) .... One Capsule By Mouth Once Daily  Allergies (verified): No Known Drug Allergies  Past History:  Family History: Last updated: 11/15/2009 No FH of Colon Cancer: Family History of Esophageal Cancer:Father   Social History: Last updated: 11/15/2009 Vertellus Married Patient currently smokes.  Alcohol Use - yes: 2-3 glasses of wine or beers daily  Daily Caffeine Use: 1 cup  of coffee daily  Illicit Drug Use - no  Past medical, surgical, family and social histories (including risk factors) reviewed for relevance to current acute and chronic problems.  Past Medical History: Reviewed history from 12/21/2009 and no changes required. Anxiety Colonic polyps Tubular adenomas 2008 & hyperplastic 2011 Diverticulitis, hx of GERD  Past Surgical History: Reviewed history from 11/15/2009 and no changes required. Laparoscopically-assisted Sigmoid Colectomy---11-10-2007  Family History: Reviewed history from 11/15/2009 and no changes required. No FH of Colon Cancer: Family History of Esophageal Cancer:Father   Social History: Reviewed history from 11/15/2009 and no changes required. Vertellus Married Patient currently smokes.  Alcohol Use - yes: 2-3 glasses of wine or beers daily  Daily Caffeine Use: 1 cup of coffee daily  Illicit Drug Use - no  Review of Systems  The patient denies allergy/sinus, anemia, anxiety-new, arthritis/joint pain, back pain, blood in urine, breast changes/lumps, change in vision, confusion, cough, coughing up blood, depression-new, fainting, fatigue, fever, headaches-new, hearing problems, heart murmur, heart rhythm changes, itching, menstrual pain, muscle pains/cramps, night sweats, nosebleeds, pregnancy symptoms, shortness of breath, skin rash, sleeping problems, sore throat, swelling of feet/legs, swollen lymph glands, thirst - excessive , urination - excessive , urination changes/pain, urine leakage, vision changes, and voice change.    Vital Signs:  Patient profile:   55 year old male Height:      70 inches Weight:      177 pounds BMI:     25.49 BSA:     1.98 Pulse rate:   76 / minute Pulse rhythm:   regular BP sitting:   110 / 84  (left arm)  Vitals Entered  By: Merri Ray CMA Duncan Dull) (December 22, 2009 10:45 AM)  Physical Exam  General:  Well developed, well nourished, no acute distress.healthy appearing.   Psych:   Alert and cooperative. Normal mood and affect.   Impression & Recommendations:  Problem # 1:  BARRETTS ESOPHAGUS (ICD-530.85) Assessment Unchanged I discussed chronic GERD and Barrett's mucosa with Zyquan in some detail. He has a short segment of Barrett's mucosa and has a small chance of eventual dysplasia. I have recommended endoscopy with mucosal biopsies in 3 years with continued PPI therapy 1-2 times a day as needed to control his reflux symptoms. We again reviewed medical antireflux maneuvers.Printed information concerning Barrett's mucosa given to the patient for review  Problem # 2:  DIVERTICULOSIS OF COLON (ICD-562.10) Assessment: Improved Colonoscopy followup as per clinical protocol.  Patient Instructions: 1)  Copy sent to : Loreen Freud, DO  2)  Please schedule a follow-up appointment in 6 months. 3)  Please continue current medications.  4)  Barrett's Esophagus brochure given.  5)  The medication list was reviewed and reconciled.  All changed / newly prescribed medications were explained.  A complete medication list was provided to the patient / caregiver. 6)  Avoid foods high in acid content ( tomatoes, citrus juices, spicy foods) . Avoid eating within 3 to 4 hours of lying down or before exercising. Do not over eat; try smaller more frequent meals. Elevate head of bed four inches when sleeping.  7)

## 2010-05-02 NOTE — Medication Information (Signed)
Summary: [facility]   Imported By: Freddy Jaksch 05/30/2007 14:45:32  _____________________________________________________________________  External Attachment:    Type:   Image     Comment:   External Document

## 2010-05-02 NOTE — Medication Information (Signed)
Summary: Approved Nexium / Anthem [employer] [place]   Approved Nexium / Anthem [employer] [place]   Imported By: Lennie Odor 12/01/2009 14:35:36  _____________________________________________________________________  External Attachment:    Type:   Image     Comment:   External Document

## 2010-05-02 NOTE — Progress Notes (Signed)
Summary: [place] and OV   Phone Note Outgoing Call   Call placed by: Ashok Cordia RN,  December 01, 2009 12:57 PM Summary of Call: Pt needs [place] and return OV in 3 weeks.  Per Dr. Jarold Motto.   [place] sch for 12/07/09 ,  8:00 at [facility].  Arrive at 7:45.  NPO for 6 hrs prior.   LM for pt to call.   Initial call taken by: Ashok Cordia RN,  December 01, 2009 12:58 PM  Follow-up for Phone Call        Pt notified of appt.  Follow up OV scheduled. Follow-up by: Ashok Cordia RN,  December 01, 2009 1:23 PM

## 2010-05-02 NOTE — Progress Notes (Signed)
----   Converted from flag ---- ---- 01/19/2010 8:45 AM, Loreen Freud DO wrote: that is fine with me    Dr L  ---- 01/18/2010 4:28 PM, Jerolyn Shin wrote: TO DR PAZ AND DR LOWNE----  PATIENT WAS SEEING DR Blossom Hoops, THEN SAW DR LOWNE TWICE IN 2010---HE NEEDS TO SCHEDULE A CPX,  BUT WOULD PREFER A MALE DOCTOR--WANTS TO SEE DR PAZ  I HAVE NOT SCHEDULED ANY APPOINTMENT YET.  IS IT OK WITH BOTH OF YOU TO GIVE HIM AN APPOINTMENT FOR A CPX WITH DR PAZ?  OR WOULD YOU PREFER SOMETHING ELSE? ------------------------------

## 2010-05-02 NOTE — Letter (Signed)
Summary: Patient Notice-[facility]  [address] [place], [place] [address]   Phone: [phone]  Fax: [phone]        December 02, 2009 MRN: [id number]    Ryan Smith [address] [place], [place]  [address]    Dear [name],  I am pleased to inform you that the biopsies taken during your recent endoscopic examination did not show any evidence of cancer upon pathologic examination.  However, your biopsies indicate you have a condition known as Barrett's esophagus. While not cancer, it is pre-cancerous (can progress to cancer) and needs to be monitored with repeat endoscopic examination and biopsies.  Fortunately, it is quite rare that this develops into cancer, but careful monitoring of the condition along with taking your medication as prescribed is important in reducing the risk of developing cancer.  It is my recommendation that you have a repeat upper gastrointestinal endoscopic examination in 3_ years.  Additional information/recommendations:  __Please call [phone] to schedule a return visit to further      evaluate your condition.  _X_Continue with treatment plan as outlined the day of your exam.  Please call us if you have or develop heartburn, reflux symptoms, any swallowing problems, or if you have questions about your condition that have not been fully answered at this time.  Sincerely,  Mardella Layman MD [facility]  This letter has been electronically signed by your physician.  Appended Document: Patient Notice-Barrett's Esopghagus letter mailed

## 2010-05-02 NOTE — Procedures (Signed)
Summary: Colonoscopy  Patient: Ryan Smith Note: All result statuses are Final unless otherwise noted.  Tests: (1) Colonoscopy (COL)   COL Colonoscopy           DONE     [facility]     [address]     [place], [place]  [address]           COLONOSCOPY PROCEDURE REPORT           PATIENT:  Ryan Smith, Ryan Smith  MR#:  [id number]     BIRTHDATE:  1956/04/01, 53 yrs. old  GENDER:  male     ENDOSCOPIST:  Vania Rea. Jarold Motto, MD, [facility]     REF. BY:     PROCEDURE DATE:  11/30/2009     PROCEDURE:  Colonoscopy with snare polypectomy     ASA CLASS:  Class II     INDICATIONS:  history of pre-cancerous (adenomatous) colon polyps           MEDICATIONS:   Fentanyl 50 mcg IV, Versed 7 mg IV           DESCRIPTION OF PROCEDURE:   After the risks benefits and     alternatives of the procedure were thoroughly explained, informed     consent was obtained.  Digital rectal exam was performed and     revealed no abnormalities.   The [id number] endoscope was     introduced through the anus and advanced to the terminal ileum     which was intubated for a short distance, without limitations.     The quality of the prep was excellent, using MoviPrep.  The     instrument was then slowly withdrawn as the colon was fully     examined.     <<PROCEDUREIMAGES>>           FINDINGS:  A sessile polyp was found in the proximal transverse     colon. FLAT POLYP COLD SNARE EXCISED.  There was a surgical     anastomosis. WIDELY PATENT LOW ANASTOMOSIS WITH SCATTERED LEFT     COLON TICS.  This was otherwise a normal examination of the colon.     Retroflexed views in the rectum revealed no abnormalities.    The     scope was then withdrawn from the patient and the procedure     completed.           COMPLICATIONS:  None     ENDOSCOPIC IMPRESSION:     1) Sessile polyp in the proximal transverse colon     2) Anastomosis     3) Otherwise normal examination     R/O ADENOMA.     RECOMMENDATIONS:     1) Follow  up colonoscopy in 5 years     REPEAT EXAM:  No           ______________________________     Vania Rea. Jarold Motto, MD, [facility]           CC:  Lelon Perla, DO           n.     eSIGNED:   Vania Rea. Patterson at 11/30/2009 03:04 PM           Huey Bienenstock, [id number]  Note: An exclamation mark (!) indicates a result that was not dispersed into the flowsheet. Document Creation Date: 11/30/2009 3:05 PM _______________________________________________________________________  (1) Order result status: Final Collection or observation date-time: 11/30/2009 14:59 Requested date-time:  Receipt date-time:  Reported date-time:  Referring Physician:   Ordering Physician: Sheryn Bison [phone]) Specimen Source:  Source: Launa Grill Order Number: [id number] Lab site:   Appended Document: Colonoscopy     Procedures Next Due Date:    Colonoscopy: 12/2014

## 2010-05-02 NOTE — Progress Notes (Signed)
Summary: CIPRO NOT WORKING  Medications Added AUGMENTIN 875-125 MG  TABS (AMOXICILLIN-POT CLAVULANATE) one by mouth once daily       Phone Note Call from Patient Call back at Work Phone [phone]   Call For: DR PATTERSON Reason for Call: Talk to Nurse Summary of Call: ON CIPRO & FLAGYL X10 DAYS NOW. FEELS LIKE ITS NOT WORKING. WONDERS IF WE CAN GIVE HIM SOMETHING STRONGER. USES [facility] [address] Initial call taken by: Leanor Kail [facility],  October 01, 2007 9:30 AM  Follow-up for Phone Call        no pain but still feels like he is in flare and still alot of constapation and bloating every time he eats ... states that he feels like there is still something going on and he does have colon schedule for 10-17-07 and can not do sooner. wants something stronger than cipro and flagyl, pt thinks that becuase he has used the drugs so much they are not working. He has been on Cipro and flagyl for 12 days now. Follow-up by: Harlow Mares CMA,  October 01, 2007 9:41 AM  Additional Follow-up for Phone Call Additional follow up Details #1::        rx sent to pharm and he spoke with nurse at [facility] and they are going to call her about doing the surgery after the surgery. Additional Follow-up by: Harlow Mares CMA,  October 01, 2007 11:34 AM    New/Updated Medications: AUGMENTIN 875-125 MG  TABS (AMOXICILLIN-POT CLAVULANATE) one by mouth once daily   Prescriptions: AUGMENTIN 875-125 MG  TABS (AMOXICILLIN-POT CLAVULANATE) one by mouth once daily  #14 x 0   Entered by:   Harlow Mares CMA   Authorized by:   Mardella Layman MD FACG,FAGA   Signed by:   Harlow Mares CMA on 10/01/2007   Method used:   Electronically sent to ...       [facility]  [address] [phone]*       [address]       [place], [place]         Ph: [phone]       Fax: [phone]   RxID:   [id number]

## 2010-05-02 NOTE — Progress Notes (Signed)
Summary: Pt t given [facility] for appt       Additional Follow-up for Phone Call Additional follow up Details #2::    Spoke with pt informed needs office visit for med refill. Former pt of Dr Blossom Hoops, offered 1st avail roa with Dr Drue Novel 11/12/07 pt refused, due to having surgery, offered [facility] pt agreed and number given.Kandice Hams  October 27, 2007 11:12 AM  Follow-up by: Kandice Hams,  October 27, 2007 11:13 AM

## 2010-05-02 NOTE — Progress Notes (Signed)
Summary: Lfts   Phone Note Outgoing Call   Call placed by: Harlow Mares CMA,  December 02, 2007 10:13 AM Summary of Call: called pt to remind him LFTS due now, he will come soon, but he is recovering from surgery, so it wont be this week. order in [facility] Initial call taken by: Harlow Mares CMA,  December 02, 2007 10:14 AM

## 2010-05-02 NOTE — Letter (Signed)
Summary: Patient Notice- Polyp Results  [facility]  [address] [place], [place] [address]   Phone: [phone]  Fax: [phone]        December 02, 2009 MRN: [id number]    Ryan Smith [address] [place], [place]  [address]    Dear Ryan Smith,  I am pleased to inform you that the colon polyp(s) removed during your recent colonoscopy was (were) found to be benign (no cancer detected) upon pathologic examination.  I recommend you have a repeat colonoscopy examination in 5_ years to look for recurrent polyps, as having colon polyps increases your risk for having recurrent polyps or even colon cancer in the future.  Should you develop new or worsening symptoms of abdominal pain, bowel habit changes or bleeding from the rectum or bowels, please schedule an evaluation with either your primary care physician or with me.  Additional information/recommendations:  _X_ No further action with gastroenterology is needed at this time. Please      follow-up with your primary care physician for your other healthcare      needs.  __ Please call [phone] to schedule a return visit to review your      situation.  __ Please keep your follow-up visit as already scheduled.  __ Continue treatment plan as outlined the day of your exam.  Please call us if you are having persistent problems or have questions about your condition that have not been fully answered at this time.  Sincerely,  Mardella Layman MD [facility]  This letter has been electronically signed by your physician.  Appended Document: Patient Notice- Polyp Results letter mailed

## 2010-05-02 NOTE — Progress Notes (Signed)
Summary: QUESTION ABOUT CPX--APPT SCHED  Phone Note Call from Patient Call back at [facility] Phone [phone] Call back at [facility] - [phone]   Summary of Call: TO DR PAZ AND DR Laury Axon----  PATIENT WAS SEEING DR Blossom Hoops, THEN SAW DR LOWNE TWICE IN 2010---HE NEEDS TO SCHEDULE A CPX,  BUT WOULD PREFER A MALE DOCTOR--WANTS TO SEE DR PAZ  I HAVE NOT SCHEDULED ANY APPOINTMENT YET.  IS IT OK WITH BOTH OF YOU TO GIVE HIM AN APPOINTMENT FOR A CPX WITH DR PAZ?  OR WOULD YOU PREFER SOMETHING ELSE??  Follow-up for Phone Call        ok w/ me if  Dr Elbert Ewings. ok Jose E. Paz MD  January 18, 2010 6:12 PM  if ok w/ Dr Laury Axon, schedule a CPX  with me Nolon Rod. Paz MD  January 19, 2010 12:55 PM   Additional Follow-up for Phone Call Additional follow up Details #1::        SEE PHONE NOTE DATED 10/20 FOR "OK" FROM DR Daun Peacock CPX FOR 11/15 AT 12:40PM Additional Follow-up by: Jerolyn Shin,  January 24, 2010 11:02 AM

## 2010-05-02 NOTE — Medication Information (Signed)
Summary: Approved for Nexium/Anthem [employer]  Approved for Nexium/Anthem [employer]   Imported By: Sherian Rein 12/28/2009 13:21:42  _____________________________________________________________________  External Attachment:    Type:   Image     Comment:   External Document

## 2010-05-02 NOTE — Progress Notes (Signed)
Summary: Refill Request  Phone Note Refill Request Call back at [phone] Message from:  Patient on March 07, 2009 11:08 AM  Refills Requested: Medication #1:  PRISTIQ 50 MG XR24H-TAB 1 by mouth once daily   Last Refilled: 10/28/2008 Need rx called in today. [facility] [address]  LAST OV- 10/28/08    Method Requested: Telephone to Pharmacy Initial call taken by: Barnie Mort,  March 07, 2009 11:09 AM  Follow-up for Phone Call        done Follow-up by: Loreen Freud DO,  March 07, 2009 12:09 PM  Additional Follow-up for Phone Call Additional follow up Details #1::        pt aware.  Additional Follow-up by: Army Fossa CMA,  March 07, 2009 1:33 PM    Prescriptions: PRISTIQ 50 MG XR24H-TAB (DESVENLAFAXINE SUCCINATE) 1 by mouth once daily  #30 x 2   Entered and Authorized by:   Loreen Freud DO   Signed by:   Loreen Freud DO on 03/07/2009   Method used:   Electronically to        [facility] [phone]* (retail)       [address]       [place], [place]  [address]       Ph: [phone]       Fax: [phone]   RxID:   [id number]

## 2010-05-02 NOTE — Progress Notes (Signed)
  Phone Note Outgoing Call Call back at [facility] Phone [phone]   Call placed by: Ardyth Man,  July 21, 2007 7:39 AM Call placed to: Patient Summary of Call: Left message for patient needs ov, for med refill ...................................................................Ardyth Man  July 21, 2007 7:39 AM

## 2010-05-02 NOTE — Assessment & Plan Note (Signed)
Summary: oral thrush//tl   Vital Signs:  Patient Profile:   55 Years Old Male Weight:      160.25 pounds Temp:     98.0 degrees F oral Pulse rate:   72 / minute Resp:     14 per minute BP sitting:   138 / 80  (right arm)  Pt. in pain?   no  Vitals Entered By: Ardyth Man (November 01, 2006 12:37 PM)                Chief Complaint:  Pt has rash in mouth..  History of Present Illness: Report whitish plaque on tongue noted by his dentist. Was told it was thrush. Patient just completed  2 antibiotics for diverticulitis. No other complaints        Physical Exam  Mouth:     Whitish plaque on tongue and buccal mucosa.    Impression & Recommendations:  Problem # 1:  CANDIDIASIS, ORAL (ICD-112.0) Nystatin Anticipatory guidance reviewed F/u if no improvement or worsens.  Complete Medication List: 1)  Nexium 40 Mg Cpdr (Esomeprazole magnesium) 2)  Alprazolam 0.5 Mg Tabs (Alprazolam) .... Take 1 tablet by mouth twice a day 3)  Zolpidem Tartrate 10 Mg Tabs (Zolpidem tartrate) .Marland Kitchen.. 1 tablet by mouth at bedtime 4)  Nystatin 100000 Unit/ml Susp (Nystatin) .... 5ml swish and swallow qid for 10 days.     Prescriptions: NYSTATIN 100000 UNIT/ML  SUSP (NYSTATIN) 5ml swish and swallow QID for 10 days.  #QS x 0   Entered and Authorized by:   Leanne Chang MD   Signed by:   Leanne Chang MD on 11/01/2006   Method used:   Print then Give to Patient   RxID:   [id number]

## 2010-05-02 NOTE — Procedures (Signed)
Summary: Upper Endoscopy  Patient: [name] Note: All result statuses are Final unless otherwise noted.  Tests: (1) Upper Endoscopy (EGD)   EGD Upper Endoscopy       DONE     [facility]     [address]     [place], [place]  [address]           ENDOSCOPY PROCEDURE REPORT           PATIENT:  Ryan Smith, Ryan Smith  MR#:  [id number]     BIRTHDATE:  [birth year], 53 yrs. old  GENDER:  male           ENDOSCOPIST:  Vania Rea. Jarold Motto, MD, [facility]     Referred by:           PROCEDURE DATE:  11/30/2009     PROCEDURE:  EGD with biopsy     ASA CLASS:  Class II     INDICATIONS:  chest pain, GERD           MEDICATIONS:   There was residual sedation effect present from     prior procedure., Fentanyl 25 mcg IV, Versed 2 mg IV     TOPICAL ANESTHETIC:  Exactacain Spray           DESCRIPTION OF PROCEDURE:   After the risks benefits and     alternatives of the procedure were thoroughly explained, informed     consent was obtained.  The  endoscope was introduced through the     mouth and advanced to the second portion of the duodenum, without     limitations.  The instrument was slowly withdrawn as the mucosa     was fully examined.     <<PROCEDUREIMAGES>>           A hiatal hernia was found. 5 CM. HH NOTED  irregular Z-line. AREA     BIOPSIED.R/O BARRETT'S MUCOSA.  Normal duodenal folds were noted.     The stomach was entered and closely examined. The antrum,     angularis, and lesser curvature were well visualized, including a     retroflexed view of the cardia and fundus. The stomach wall was     normally distensable. The scope passed easily through the pylorus     into the duodenum.    Retroflexed views revealed a hiatal hernia.     The scope was then withdrawn from the patient and the procedure     completed.           COMPLICATIONS:  None           ENDOSCOPIC IMPRESSION:     1) Hiatal hernia     2) Irregular Z-line     3) Normal duodenal folds     4) Normal stomach     5) A  hiatal hernia     CHRONIC GERD.R/O BARRETT'S MUCOSA.PROMINANT HH NOTED.     RECOMMENDATIONS:     1) Await biopsy results     2) continue PPI     IF NOT DONE,SCHEDULE ULTRASOUND.SEE ME 3 WEEKS.MAY NEED TO     PROCEDE WITH FUNDOPLICATION REPAIR OF HH.           REPEAT EXAM:  No           ______________________________     Vania Rea. Jarold Motto, MD, [facility]           CC:  Lelon Perla, DO           n.  eSIGNED:   Vania Rea. Patterson at 11/30/2009 03:16 PM           Huey Bienenstock, [id number]  Note: An exclamation mark (!) indicates a result that was not dispersed into the flowsheet. Document Creation Date: 11/30/2009 3:17 PM _______________________________________________________________________  (1) Order result status: Final Collection or observation date-time: 11/30/2009 15:10 Requested date-time:  Receipt date-time:  Reported date-time:  Referring Physician:   Ordering Physician: Sheryn Bison [phone]) Specimen Source:  Source: Launa Grill Order Number: [id number] Lab site:   Appended Document: Upper Endoscopy    Clinical Lists Changes  Orders: Added new Test order of Ultrasound Abdomen (UAS) - Signed      Appended Document: Upper Endoscopy     Procedures Next Due Date:    EGD: 12/2012

## 2010-05-02 NOTE — Progress Notes (Signed)
Summary: labs due   Phone Note Outgoing Call Call back at [facility] Phone [phone]   Call placed by: Harlow Mares CMA,  July 05, 2008 11:28 AM Summary of Call: patient wife aware lfts needed and she will advise patient to come by the lab at his convience this week. Initial call taken by: Harlow Mares CMA,  July 05, 2008 11:28 AM

## 2010-05-02 NOTE — Letter (Signed)
Summary: Physical Exam Form/[employer]  Physical Exam Form/[employer]   Imported By: Lanelle Bal 02/24/2010 10:34:42  _____________________________________________________________________  External Attachment:    Type:   Image     Comment:   External Document

## 2010-05-02 NOTE — Progress Notes (Signed)
Summary: med ?  Phone Note Refill Request Message from:  Patient  Refills Requested: Medication #1:  NYSTATIN 100000 UNIT/ML  SUSP 5ml swish and swallow QID for 10 days.. [phone] [facility] [address]   pt has oral thrush on 8.1.08 and would like  refill for this medication again. i did suggested that he be seen due to this acute problem was back in [place]. he refused the ov and said that he is having the same symptoms and didnt have time to come into the office.   Method Requested: Fax to Local Pharmacy Initial call taken by: Charolette Child,  January 28, 2007 10:41 AM Caller: Patient  Follow-up for Phone Call        [facility] NEED OV BEFORE RX CALLED IN PLEASE CALL AND SCHEDULE Follow-up by: Kandice Hams,  January 28, 2007 2:55 PM

## 2010-05-02 NOTE — Miscellaneous (Signed)
Summary: moviprep  Clinical Lists Changes  Medications: Added new medication of MOVIPREP 100 GM  SOLR (PEG-KCL-NACL-NASULF-NA ASC-C) As per prep instructions. - Signed Rx of MOVIPREP 100 GM  SOLR (PEG-KCL-NACL-NASULF-NA ASC-C) As per prep instructions.;  #1 x 0;  Signed;  Entered by: Sherren Kerns RN;  Authorized by: Mardella Layman MD FACG,FAGA;  Method used: Electronic Observations: Added new observation of ALLERGY REV: Done (09/22/2007 11:25) Added new observation of NKA: T (09/22/2007 11:25)    Prescriptions: MOVIPREP 100 GM  SOLR (PEG-KCL-NACL-NASULF-NA ASC-C) As per prep instructions.  #1 x 0   Entered by:   Sherren Kerns RN   Authorized by:   Mardella Layman MD [facility]   Signed by:   Sherren Kerns RN on 09/22/2007   Method used:   Electronically sent to ...       [facility]  [address] [phone]*       [address]       [place], [place]         Ph: [phone]       Fax: [phone]   RxID:   [id number]

## 2010-05-02 NOTE — Letter (Signed)
[place] [facility] [place]   Imported By: Freddy Jaksch 01/23/2007 12:17:00  _____________________________________________________________________  External Attachment:    Type:   Image     Comment:   External Document

## 2010-05-02 NOTE — Assessment & Plan Note (Signed)
Summary: rto 6 weeks.cbs   Vital Signs:  Patient profile:   55 year old male Weight:      165 pounds Temp:     98.2 degrees F oral Pulse rate:   68 / minute Resp:     14 per minute BP sitting:   118 / 80  (right arm)  Vitals Entered By: Ardyth Man (October 28, 2008 10:31 AM) CC: follow up 6 weeks on medications Is Patient Diabetic? No Pain Assessment Patient in pain? no       Have you ever been in a relationship where you felt threatened, hurt or afraid?No   Does patient need assistance? Functional Status Self care Ambulation Normal  Vision Screening:Left eye w/o correction: 20 / 20 Right Eye w/o correction: 20 / 20 Both eyes w/o correction:  20/ 10        Vision Entered By: Ardyth Man (October 28, 2008 10:40 AM)   History of Present Illness: Pt here for f/u on meds.  Pt doing well.  No complaints.    Current Medications (verified): 1)  Nexium 40 Mg Cpdr (Esomeprazole Magnesium) .... One By Mouth Once Daily 2)  Pristiq 50 Mg Xr24h-Tab (Desvenlafaxine Succinate) .Marland Kitchen.. 1 By Mouth Once Daily 3)  Alprazolam 0.5 Mg Tabs (Alprazolam) .Marland Kitchen.. 1 By Mouth Three Times A Day As Needed 4)  Ambien Cr 12.5 Mg Cr-Tabs (Zolpidem Tartrate) .Marland Kitchen.. 1 By Mouth At Bedtime As Needed  Allergies (verified): No Known Drug Allergies  Past History:  Past medical, surgical, family and social histories (including risk factors) reviewed, and no changes noted (except as noted below).  Past Medical History: Reviewed history from 08/30/2006 and no changes required. Anxiety Colonic polyps, hx of Diverticulitis, hx of GERD  Family History: Reviewed history and no changes required.  Social History: Reviewed history and no changes required.  Review of Systems      See HPI  Physical Exam  General:  Well-developed,well-nourished,in no acute distress; alert,appropriate and cooperative throughout examination Head:  Normocephalic and atraumatic without obvious abnormalities. No apparent  alopecia or balding. Eyes:  pupils equal, pupils round, pupils reactive to light, and no injection.   Ears:  External ear exam shows no significant lesions or deformities.  Otoscopic examination reveals clear canals, tympanic membranes are intact bilaterally without bulging, retraction, inflammation or discharge. Hearing is grossly normal bilaterally. Nose:  External nasal examination shows no deformity or inflammation. Nasal mucosa are pink and moist without lesions or exudates. Mouth:  Oral mucosa and oropharynx without lesions or exudates.  Teeth in good repair. Neck:  No deformities, masses, or tenderness noted. Lungs:  Normal respiratory effort, chest expands symmetrically. Lungs are clear to auscultation, no crackles or wheezes. Heart:  Normal rate and regular rhythm. S1 and S2 normal without gallop, murmur, click, rub or other extra sounds. Abdomen:  Bowel sounds positive,abdomen soft and non-tender without masses, organomegaly or hernias noted. Msk:  normal ROM, no joint tenderness, no joint swelling, no joint warmth, no redness over joints, no joint deformities, no joint instability, and no crepitation.   Pulses:  R and L carotid,radial,femoral,dorsalis pedis and posterior tibial pulses are full and equal bilaterally Extremities:  No clubbing, cyanosis, edema, or deformity noted with normal full range of motion of all joints.   Neurologic:  No cranial nerve deficits noted. Station and gait are normal. Plantar reflexes are down-going bilaterally. DTRs are symmetrical throughout. Sensory, motor and coordinative functions appear intact. Skin:  Intact without suspicious lesions or rashes Cervical Nodes:  No lymphadenopathy noted Psych:  Oriented X3 and normally interactive.     Impression & Recommendations:  Problem # 1:  ANXIETY (ICD-300.00)  His updated medication list for this problem includes:    Pristiq 50 Mg Xr24h-tab (Desvenlafaxine succinate) .Marland Kitchen... 1 by mouth once daily     Alprazolam 0.5 Mg Tabs (Alprazolam) .Marland Kitchen... 1 by mouth three times a day as needed  Problem # 2:  INSOMNIA (ICD-780.52)  His updated medication list for this problem includes:    Ambien Cr 12.5 Mg Cr-tabs (Zolpidem tartrate) .Marland Kitchen... 1 by mouth at bedtime as needed  Problem # 3:  GERD (ICD-530.81)  His updated medication list for this problem includes:    Nexium 40 Mg Cpdr (Esomeprazole magnesium) ..... One by mouth once daily  Diagnostics Reviewed:  Discussed lifestyle modifications, diet, antacids/medications, and preventive measures. Handout provided.   Complete Medication List: 1)  Nexium 40 Mg Cpdr (Esomeprazole magnesium) .... One by mouth once daily 2)  Pristiq 50 Mg Xr24h-tab (Desvenlafaxine succinate) .Marland Kitchen.. 1 by mouth once daily 3)  Alprazolam 0.5 Mg Tabs (Alprazolam) .Marland Kitchen.. 1 by mouth three times a day as needed 4)  Ambien Cr 12.5 Mg Cr-tabs (Zolpidem tartrate) .Marland Kitchen.. 1 by mouth at bedtime as needed Prescriptions: PRISTIQ 50 MG XR24H-TAB (DESVENLAFAXINE SUCCINATE) 1 by mouth once daily  #30 x 2   Entered and Authorized by:   Loreen Freud DO   Signed by:   Loreen Freud DO on 10/28/2008   Method used:   Print then Give to Patient   RxID:   [id number] ALPRAZOLAM 0.5 MG TABS (ALPRAZOLAM) 1 by mouth three times a day as needed  #60 x 0   Entered and Authorized by:   Loreen Freud DO   Signed by:   Loreen Freud DO on 10/28/2008   Method used:   Print then Give to Patient   RxID:   [id number]

## 2010-05-02 NOTE — Assessment & Plan Note (Signed)
Summary: stress/kdc   Vital Signs:  Patient profile:   55 year old male Weight:      166 pounds Temp:     98.0 degrees F oral Pulse rate:   72 / minute Resp:     14 per minute BP sitting:   122 / 80  (right arm)  Vitals Entered By: Ardyth Man (September 16, 2008 1:48 PM) CC: discuss stress Is Patient Diabetic? No Pain Assessment Patient in pain? no       Have you ever been in a relationship where you felt threatened, hurt or afraid?No   Does patient need assistance? Functional Status Self care Ambulation Normal   History of Present Illness: Pt here c/o increased stress secondary to work and home/ family.   Pt was on ambien and xanax in past which worked well.    Current Medications (verified): 1)  Nexium 40 Mg Cpdr (Esomeprazole Magnesium) .... One By Mouth Once Daily 2)  Zoloft 50 Mg Tabs (Sertraline Hcl) .Marland Kitchen.. 1 By Mouth Once Daily 3)  Alprazolam 0.25 Mg Tabs (Alprazolam) .Marland Kitchen.. 1 By Mouth Two Times A Day As Needed 4)  Ambien Cr 12.5 Mg Cr-Tabs (Zolpidem Tartrate) .Marland Kitchen.. 1 By Mouth At Bedtime As Needed  Allergies (verified): No Known Drug Allergies  Past History:  Past medical, surgical, family and social histories (including risk factors) reviewed, and no changes noted (except as noted below).  Past Medical History: Reviewed history from 08/30/2006 and no changes required. Anxiety Colonic polyps, hx of Diverticulitis, hx of GERD  Family History: Reviewed history and no changes required.  Social History: Reviewed history and no changes required.  Review of Systems      See HPI  Physical Exam  General:  Well-developed,well-nourished,in no acute distress; alert,appropriate and cooperative throughout examination Lungs:  Normal respiratory effort, chest expands symmetrically. Lungs are clear to auscultation, no crackles or wheezes. Heart:  normal rate, regular rhythm, and no murmur.   Extremities:  No clubbing, cyanosis, edema, or deformity noted with normal  full range of motion of all joints.   Skin:  Intact without suspicious lesions or rashes Cervical Nodes:  No lymphadenopathy noted Psych:  Cognition and judgment appear intact. Alert and cooperative with normal attention span and concentration. No apparent delusions, illusions, hallucinations   Impression & Recommendations:  Problem # 1:  ANXIETY (ICD-300.00)  The following medications were removed from the medication list:    Alprazolam 0.5 Mg Tabs (Alprazolam) .Marland Kitchen... Take 1 tablet by mouth twice a day His updated medication list for this problem includes:    Zoloft 50 Mg Tabs (Sertraline hcl) .Marland Kitchen... 1 by mouth once daily    Alprazolam 0.25 Mg Tabs (Alprazolam) .Marland Kitchen... 1 by mouth two times a day as needed  Discussed medication use and relaxation techniques.   Complete Medication List: 1)  Nexium 40 Mg Cpdr (Esomeprazole magnesium) .... One by mouth once daily 2)  Zoloft 50 Mg Tabs (Sertraline hcl) .Marland Kitchen.. 1 by mouth once daily 3)  Alprazolam 0.25 Mg Tabs (Alprazolam) .Marland Kitchen.. 1 by mouth two times a day as needed 4)  Ambien Cr 12.5 Mg Cr-tabs (Zolpidem tartrate) .Marland Kitchen.. 1 by mouth at bedtime as needed  Patient Instructions: 1)  rto 4-6 weeks Prescriptions: AMBIEN CR 12.5 MG CR-TABS (ZOLPIDEM TARTRATE) 1 by mouth at bedtime as needed  #15 x 0   Entered and Authorized by:   Loreen Freud DO   Signed by:   Loreen Freud DO on 09/16/2008   Method used:   Print  then Give to Patient   RxID:   [id number] ALPRAZOLAM 0.25 MG TABS (ALPRAZOLAM) 1 by mouth two times a day as needed  #60 x 0   Entered and Authorized by:   Loreen Freud DO   Signed by:   Loreen Freud DO on 09/16/2008   Method used:   Print then Give to Patient   RxID:   [id number] ZOLOFT 50 MG TABS (SERTRALINE HCL) 1 by mouth once daily  #30 x 2   Entered and Authorized by:   Loreen Freud DO   Signed by:   Loreen Freud DO on 09/16/2008   Method used:   Electronically to        [facility] [phone]* (retail)       [address]       [place], [place]  [address]       Ph: [phone]       Fax: [phone]   RxID:   [id number]

## 2010-05-02 NOTE — Assessment & Plan Note (Signed)
Summary: CPX///SPH   Vital Signs:  Patient profile:   55 year old male Height:      70 inches Weight:      178 pounds O2 Sat:      97 % on Room air Pulse rate:   73 / minute Pulse rhythm:   regular BP sitting:   126 / 84  (left arm) Cuff size:   regular  Vitals Entered By: Army Fossa CMA (February 14, 2010 12:39 PM)  O2 Flow:  Room air CC: CPX, not fasting  Comments [facility] [address]   History of Present Illness: CPX    Current Medications (verified): 1)  Nexium 40 Mg Cpdr (Esomeprazole Magnesium) .... One By Mouth Bid 2)  Magnesium 300 Mg Caps (Magnesium) .... One By Mouth At Bedtime Once Daily 3)  Red Yeast Rice Extract 600 Mg Caps (Red Yeast Rice Extract) .... One Capsule By Mouth Once Daily 4)  Alprazolam 0.5 Mg Tabs (Alprazolam) .Marland Kitchen.. 1 By Mouth Three Times A Day As Needed  Allergies (verified): No Known Drug Allergies  Past History:  Past Medical History: Anxiety Colonic polyps Tubular adenomas 2008 & hyperplastic 2011 Diverticulitis, hx of GERD, Barrett's esophagus for EGD 9-11, next 3 years Hearing loss, has B hearing aids  increased LFTs:  2008 neg. hep. serology, (-) ANA, ceruloplasmin, etc.;  liver biopsy showed  minimal active inflammation  Past Surgical History: Reviewed history from 11/15/2009 and no changes required. Laparoscopically-assisted Sigmoid Colectomy---11-10-2007  Family History: prostate ca-- F dx age 26 Colon Cancer: no Esophageal Cancer:Father  DM-- no CAD-MI-- F CABG age 33 stroke-- no  Social History: works at [employer], Data processing manager born in [place] Married 2 step sons Patient currently smokes-- 1/5 ppd Alcohol Use - yes: 2-3 glasses of wine or beers daily  Daily Caffeine Use: 1 cup of coffee daily  Illicit Drug Use - no  Review of Systems General:  Denies fatigue, fever, and weight loss. CV:  Denies swelling of feet; occasionally palpitations w/ stress . Resp:  Denies cough and wheezing; since  the EGD and  colonoscopy few months ago, finds that is difficult to "take a deep breath" Denies chest pain or dyspnea on exertion. Still able to walk the dogs and go as far as he did last year without problems . GI:  Denies bloody stools, diarrhea, nausea, and vomiting. GU:  Denies dysuria, hematuria, urinary frequency, and urinary hesitancy. Psych:  + stress at work, uses alprazolam to 3 times a week. sometimes has difficulty falling asleep.  Physical Exam  General:  alert and well-developed.  alert and well-developed.   Neck:  no masses, no thyromegaly, and normal carotid upstroke.  Lungs:  normal respiratory effort, no intercostal retractions, no accessory muscle use, and normal breath sounds.   Heart:  normal rate, regular rhythm, no murmur, and no gallop.   Abdomen:  soft, non-tender, no distention, no masses, no guarding, and no rigidity.  has a umbilical hernia, reduciblesoft, non-tender, no distention, no masses, no guarding, and no rigidity.   Rectal:  external hemorrhoids noted. Normal sphincter tone. No rectal masses or tenderness. Prostate:  Prostate gland firm and smooth, no enlargement, nodularity, tenderness, mass, asymmetry or induration. Extremities:  no lower extremity edema Psych:  Oriented X3, memory intact for recent and remote, normally interactive, good eye contact, not anxious appearing, and not depressed appearing.     Impression & Recommendations:  Problem # 1:  ROUTINE GENERAL MEDICAL EXAM@HEALTH  CARE FACL (ICD-V70.0) td --- 2007  had  a flu shot  last colonoscopy 10/2009, polyp, next 2016  counseling: diet, exercise , tobacco risks, advised to quit EKG--nsr labs  Orders: EKG w/ Interpretation (93000)  Problem # 2:  ? of DYSPNEA (ICD-786.05) see review of systems, having a difficult time "taking a deep breath" since he had his endoscopies a month ago. denies chest pain or actual shortness of breath Physical exam is normal We'll do a Spirometry  Complete  Medication List: 1)  Nexium 40 Mg Cpdr (Esomeprazole magnesium) .... One by mouth bid 2)  Magnesium 300 Mg Caps (Magnesium) .... One by mouth at bedtime once daily 3)  Red Yeast Rice Extract 600 Mg Caps (Red yeast rice extract) .... One capsule by mouth once daily 4)  Alprazolam 0.5 Mg Tabs (Alprazolam) .Marland Kitchen.. 1 by mouth three times a day as needed  Patient Instructions: 1)  please come back fasting 2)  FLP, PSA---dx V70 3)  Please schedule a follow-up appointment in 1 year.    Orders Added: 1)  EKG w/ Interpretation [93000] 2)  Est. Patient age 56-64 [99396]   Immunization History:  Tetanus/Td Immunization History:    Tetanus/Td:  historical (04/02/2005)  Influenza Immunization History:    Influenza:  historical (01/19/2010)   Immunization History:  Tetanus/Td Immunization History:    Tetanus/Td:  Historical (04/02/2005)  Influenza Immunization History:    Influenza:  Historical (01/19/2010)

## 2010-05-02 NOTE — Progress Notes (Signed)
Summary: Lab follow-up  Medications Added NEXIUM 40 MG CPDR (ESOMEPRAZOLE MAGNESIUM)  ALPRAZOLAM 0.5 MG TABS (ALPRAZOLAM) Take 1 tablet by mouth twice a day ZOLPIDEM TARTRATE 10 MG TABS (ZOLPIDEM TARTRATE) 1 tablet by mouth at bedtime       Phone Note Outgoing Call   Call placed by: Ardyth Man,  October 18, 2006 9:49 AM Call placed to: Patient Summary of Call: [facility] ...................................................................Ardyth Man  October 18, 2006 9:49 AM  Initial call taken by: Leanne Chang MD,  October 22, 2006 9:54 PM  Follow-up for Phone Call        pt saw Dr. Jarold Motto with [facility] for GI Pt. called office this morning again to let you know he does not need another appt. he has one on Thursday w/ Dr. Jarold Motto. ...................................................................Ardyth Man  October 22, 2006 9:32 AM  Follow-up by: Doristine Devoid,  October 21, 2006 5:09 PM  Additional Follow-up for Phone Call Additional follow up Details #1::        Please fax copy of LFT to Dr. Jarold Motto.    Additional Follow-up for Phone Call Additional follow up Details #2::    Faxed copy of labs to Dr. Jarold Motto [phone] and [phone] ...................................................................Ardyth Man  October 23, 2006 7:52 AM  Follow-up by: Ardyth Man,  October 23, 2006 7:52 AM  New/Updated Medications: NEXIUM 40 MG CPDR (ESOMEPRAZOLE MAGNESIUM)  ALPRAZOLAM 0.5 MG TABS (ALPRAZOLAM) Take 1 tablet by mouth twice a day ZOLPIDEM TARTRATE 10 MG TABS (ZOLPIDEM TARTRATE) 1 tablet by mouth at bedtime

## 2010-05-02 NOTE — Procedures (Signed)
Summary: Colonoscopy   Colonoscopy  Procedure date:  10/17/2007  Findings:      Location:  [facility].    Procedures Next Due Date:    Colonoscopy: 09/2012  Patient Name: Ryan Smith, Kushner MRN: [id number] Procedure Procedures: Colonoscopy CPT: 04540.  Personnel: Endoscopist: Vania Rea. Jarold Motto, MD.  Exam Location: Exam performed in Outpatient Clinic. Outpatient  Patient Consent: Procedure, Alternatives, Risks and Benefits discussed, consent obtained, from patient. Consent was obtained by the RN.  Indications Symptoms: Abdominal pain / bloating.  Surveillance of: Adenomatous Polyp(s).  Comments: RECURRENT DIVERTICULITIS.Marland KitchenMarland Kitchen History  Current Medications: Patient is not currently taking Coumadin.  Medical/ Surgical History: Reflux Disease, Headaches, Anxiety Disorder,  Pre-Exam Physical: Performed Oct 17, 2007. Cardio-pulmonary exam, Rectal exam, Abdominal exam, Extremity exam, Mental status exam WNL.  Comments: Pt. history reviewed/updated, physical exam performed prior to initiation of sedation? YES Exam Exam: Extent of exam reached: Cecum, extent intended: Cecum.  The cecum was identified by appendiceal orifice and IC valve. Patient position: on left side. Time to Cecum: 00:02:15. Time for Withdrawl: 00:02:50. Colon retroflexion performed. Images taken. ASA Classification: II. Tolerance: excellent.  Monitoring: Pulse and BP monitoring, Oximetry used. Supplemental O2 given. at 2 Liters.  Colon Prep Used Golytely for colon prep. Prep results: excellent.  Sedation Meds: Patient assessed and found to be appropriate for moderate (conscious) sedation. Sedation was managed by the Endoscopist. Fentanyl 75 mcg. given IV. Versed 7 mg. given IV.  Instrument(s): CF 140L. Serial [id number].  Findings - NORMAL EXAM: Cecum to Descending Colon. Not Seen: Polyps. AVM's. Colitis. Tumors. Crohn's. Diverticulosis.  - DIVERTICULOSIS: Descending Colon to Sigmoid  Colon. Not bleeding. ICD9: Diverticulosis, Colon: 562.10. Comments: Thickened red haustral folds noted...  - OTHER FINDING: Sigmoid Colon. Comments: Thickened base where prior large polyp resected...  - NORMAL EXAM: Sigmoid Colon to Rectum. Tumors. Crohn's. Hemorrhoids.   Assessment  Diagnoses: 562.10: Diverticulosis, Colon.   Comments: NO POLYPS NOTED... Events  Unplanned Interventions: No intervention was required.  Plans Medication Plan: Continue current medications.  Patient Education: Patient given standard instructions for: Diverticulosis. Patient instructed to get routine colonoscopy every 5 years.  Disposition: After procedure patient sent to recovery. After recovery patient sent home.  Comments: PLANNED SIGMOID RESECTION PER DR.GROSS.   cc:  Girard Cooter, MD  This report was created from the original endoscopy report, which was reviewed and signed by the above listed endoscopist.

## 2010-05-02 NOTE — Progress Notes (Signed)
Summary: DIVERTICULITIS FLARE  Medications Added CIPROFLOXACIN HCL 500 MG  TABS (CIPROFLOXACIN HCL) take on by mouth two times a day METRONIDAZOLE 250 MG  TABS (METRONIDAZOLE) take one by mouth three times a day       Phone Note Call from Patient Call back at [phone]   Caller: Patient Call For: Vern Guerette Reason for Call: Talk to Nurse Summary of Call: Juliany Daughety PT.  FLARE OF DIVERTICULITIS BEGAN ON 09-17-07, PT HAS STARTED A SOFT FOODS DIET, BUT WOULD LIKE AN ANTIBIOTIC SUBMITTED TO [facility].    Initial call taken by: Magdalen Spatz [facility],  September 19, 2007 8:31 AM  Follow-up for Phone Call        Dr. Christella Hartigan you are DOD and chart is on your desk.  pt has recurent Diverticulitis flares and has been reffered to surgery, but not gone throught with surgery yet. Pt was seen in may 2009 and treated with Cipro and flagyl and advised to schedule a colonoscopy, but never called back. Now pt is in flare and wishes to be treated again with cipro and flagyl. Was treated on 06-30-07 and 07-23-07 and 08-01-07, pt did schedule colonoscopy.  Can patient be treated with antibiotics for the flare he is already on soft bland diet and still having abd pain. Follow-up by: Harlow Mares CMA,  September 19, 2007 10:30 AM  Additional Follow-up for Phone Call Additional follow up Details #1::        please call in cipro 500 two times a day for 7 days, flagyl 250 three times a day for 7 days.  He really needs to strongly consider surgery, this is at least his 4th attack in a year.   Additional Follow-up by: Rachael Fee MD,  September 19, 2007 10:34 AM    Additional Follow-up for Phone Call Additional follow up Details #2::    pt aware rxs sent and he schedule colon for 10-17-07. Follow-up by: Harlow Mares CMA,  September 19, 2007 10:44 AM  New/Updated Medications: CIPROFLOXACIN HCL 500 MG  TABS (CIPROFLOXACIN HCL) take on by mouth two times a day METRONIDAZOLE 250 MG  TABS (METRONIDAZOLE) take one by mouth  three times a day   Prescriptions: METRONIDAZOLE 250 MG  TABS (METRONIDAZOLE) take one by mouth three times a day  #21 x 0   Entered by:   Harlow Mares CMA   Authorized by:   Mardella Layman MD FACG,FAGA   Signed by:   Harlow Mares CMA on 09/19/2007   Method used:   Electronically sent to ...       [facility]  [address] [phone]*       [address]       [place], [place]         Ph: [phone]       Fax: [phone]   RxID:   [id number] CIPROFLOXACIN HCL 500 MG  TABS (CIPROFLOXACIN HCL) take on by mouth two times a day  #14 x 0   Entered by:   Harlow Mares CMA   Authorized by:   Mardella Layman MD [facility]   Signed by:   Harlow Mares CMA on 09/19/2007   Method used:   Electronically sent to ...       [facility]  [address] [phone]*       [address]       [place], [place]         Ph: [phone]       Fax: [phone]  RxID:   [id number]

## 2010-06-07 ENCOUNTER — Encounter: Payer: Self-pay | Admitting: Gastroenterology

## 2010-06-13 NOTE — Letter (Signed)
Summary: Office Visit Letter  [facility]  [address]   [place], [place] [address]   Phone: [phone]  Fax: [phone]      June 07, 2010 MRN: [id number]   [name] [address] [place], [place]  [address]   Dear Ryan Smith,   According to our records, it is time for you to schedule a follow-up office visit with [place].   At your convenience, please call [phone] (option #2)to schedule an office visit. If you have any questions, concerns, or feel that this letter is in error, we would appreciate your call.   Sincerely,   Sheryn Bison, M.D.   [facility] [phone]

## 2010-07-03 ENCOUNTER — Other Ambulatory Visit: Payer: Self-pay | Admitting: *Deleted

## 2010-07-04 MED ORDER — ALPRAZOLAM 0.5 MG PO TABS: 0.5000 mg | ORAL_TABLET | Freq: Three times a day (TID) | ORAL | Status: DC | PRN

## 2010-07-04 NOTE — Telephone Encounter (Signed)
Ok 60, 1 RF 

## 2010-07-18 ENCOUNTER — Other Ambulatory Visit: Payer: Self-pay | Admitting: Gastroenterology

## 2010-08-15 ENCOUNTER — Encounter: Payer: Self-pay | Admitting: Gastroenterology

## 2010-08-15 ENCOUNTER — Ambulatory Visit (INDEPENDENT_AMBULATORY_CARE_PROVIDER_SITE_OTHER): Payer: BC Managed Care – PPO | Admitting: Gastroenterology

## 2010-08-15 VITALS — BP 130/90 | HR 96 | Ht 69.0 in | Wt 178.2 lb

## 2010-08-15 DIAGNOSIS — R945 Abnormal results of liver function studies: Secondary | ICD-10-CM | POA: Insufficient documentation

## 2010-08-15 DIAGNOSIS — K219 Gastro-esophageal reflux disease without esophagitis: Secondary | ICD-10-CM

## 2010-08-15 DIAGNOSIS — Z860101 Personal history of adenomatous and serrated colon polyps: Secondary | ICD-10-CM

## 2010-08-15 DIAGNOSIS — K5790 Diverticulosis of intestine, part unspecified, without perforation or abscess without bleeding: Secondary | ICD-10-CM

## 2010-08-15 DIAGNOSIS — R7989 Other specified abnormal findings of blood chemistry: Secondary | ICD-10-CM

## 2010-08-15 DIAGNOSIS — Z86010 Personal history of colonic polyps: Secondary | ICD-10-CM

## 2010-08-15 DIAGNOSIS — K2270 Barrett's esophagus without dysplasia: Secondary | ICD-10-CM

## 2010-08-15 DIAGNOSIS — K5730 Diverticulosis of large intestine without perforation or abscess without bleeding: Secondary | ICD-10-CM

## 2010-08-15 HISTORY — DX: Other specified abnormal findings of blood chemistry: R79.89

## 2010-08-15 HISTORY — DX: Abnormal results of liver function studies: R94.5

## 2010-08-15 HISTORY — DX: Personal history of colonic polyps: Z86.010

## 2010-08-15 HISTORY — DX: Gastro-esophageal reflux disease without esophagitis: K21.9

## 2010-08-15 HISTORY — DX: Personal history of adenomatous and serrated colon polyps: Z86.0101

## 2010-08-15 HISTORY — DX: Diverticulosis of intestine, part unspecified, without perforation or abscess without bleeding: K57.90

## 2010-08-15 MED ORDER — METOCLOPRAMIDE HCL 10 MG PO TABS: 10.0000 mg | ORAL_TABLET | Freq: Every day | ORAL | Status: DC

## 2010-08-15 MED ORDER — DEXLANSOPRAZOLE 60 MG PO CPDR: 60.0000 mg | DELAYED_RELEASE_CAPSULE | ORAL | Status: DC

## 2010-08-15 NOTE — Assessment & Plan Note (Signed)
[facility]                         GASTROENTEROLOGY OFFICE NOTE   MARTINO, TOMPSON                        MRN:          [id number]  DATE:11/26/2006                            DOB:          04/19/1955    Ryan Smith is doing well with his diverticulitis and denies lower  abdominal pain or abnormal bowel movements at this time. He has had some  rather marked burning substernal chest pain which has required twice a  day Nexium. His endoscopy in February of 2008 did show a 3-cm hiatal  hernia. Biopsy did not show evidence of Barrett's mucosa.   In reviewing the patient's chart, he has had abnormal liver function  tests for the last several years. All hepatitis serologies and metabolic  workup including iron levels were normal on October 24, 2006. His only  medication besides Nexium is Ambien. He has no family history of liver  disease. Does not take over-the-counter medications. He denies right  upper quadrant pain, pruritus, chronic fatigue, or any history of known  history of liver disease or previous hepatitis. On further review of his  chart, the patient has had diverticulitis on-and-off going back to 1995  and previously was evaluated at that time by Dr.  Francina Ames. I have  referred him on to Dr.  Michaell Cowing for consideration of laparoscopic sigmoid  colon resection. I repeated his colonoscopy on May 21, 2006 and  removed several large adenomatous polyps. I repeated his ultrasound  examination on October 29, 2006 and this was entirely normal.   He weighs 159 pounds and blood pressure is 122/74. Pulse 68 and regular.  I could not appreciate hepatosplenomegaly, abdominal masses or  tenderness. Bowel sounds were normal. There were no obvious stigmata of  chronic liver disease.   ASSESSMENT:  1. Significantly persistent abnormal liver function tests of      unexplained etiology. We will need to obtain liver biopsy for      further clarification and to  exclude occult cirrhosis.  2. Chronic gastroesophageal reflux disease, doing well on PPI therapy.  3. Recurrent diverticulitis in a young male who has had at least 3      relapses in the last year. He has been considered for surgical      therapy.   RECOMMENDATIONS:  1. Continue high fiber diet and daily fiber supplements.  2. Continue reflux regimen with PPI therapy.  3. I have asked the patient to keep his surgical appointment with Dr.      Michaell Cowing.  4. I have scheduled outpatient percutaneous ultrasound guided liver      biopsy before any surgery can be performed.     Ryan Smith. Ryan Motto, MD, Caleen Essex, FAGA  Electronically Signed    DRP/MedQ  DD: 11/26/2006  DT: 11/26/2006  Job #: [id number]   cc:   Ryan Sportsman, MD  Ryan Smith, M.D.

## 2010-08-15 NOTE — Patient Instructions (Signed)
Stop your Nexium. Take Dexilant one tablet in the morning and take Reglan one tablet at bedtime. Samples of Dexilant were given and an rx has been sent. Reglan rx has also been sent to your pharmacy.

## 2010-08-15 NOTE — Assessment & Plan Note (Signed)
[facility]                         GASTROENTEROLOGY OFFICE NOTE   RONDAL, VANDEVELDE                        MRN:          [id number]  DATE:08/01/2007                            DOB:          16-Feb-1956    Mr. Ryan Smith has had another recurrent attack of diverticulitis,  responsive to oral Cipro and metronidazole.  Repeat CT scan on  07/24/2007, again showed mild sigmoid colon diverticulitis.  He  currently is asymptomatic.   The patient had a colonoscopy done a year ago which showed prominent  colon polyps which were adenomatous polyps and evidence of left colon  diverticulosis.  He previously had a surgical consultation with Dr.  Michaell Cowing, but has not had sigmoid resection.  His acid reflux is under  control on daily Nexium.  He has slightly abnormal liver enzymes of  unexplained etiology with negative liver biopsy.  He denies any  hepatobiliary or upper GI complaints at this time.  He follows a regular  diet and he takes plenty of fiber.  He denies any specific food  intolerances.  He has had no melena, hematochezia, fever, chills, or  other systemic complaints.   PHYSICAL EXAMINATION:  VITAL SIGNS:  He weighs 167 pounds, blood  pressure 120/76, pulse 72 and regular.  ABDOMEN:  Entirely benign without organomegaly, masses or tenderness.  Bowel sounds were normal.   ASSESSMENT:  1. Recurrent diverticulitis with need for sigmoid resection for at      least three episodes over the last year.  2. Well controlled gastroesophageal reflux disease on daily Nexium.  3. Hypertransaminasemia of unexplained etiology.   RECOMMENDATIONS:  1. Repeat labs and liver profile.  2. Continue high-fiber diet with daily Metamucil and liberal p.o.      fluids.  3. Repeat colonoscopy and if this is otherwise unremarkable, refer      back to surgery for laparoscopic sigmoid resection if feasible.     Vania Rea. Jarold Motto, MD, Caleen Essex, FAGA  Electronically  Signed    DRP/MedQ  DD: 08/01/2007  DT: 08/01/2007  Job #: [id number]   cc:   Ardeth Sportsman, MD

## 2010-08-15 NOTE — Discharge Summary (Signed)
NAME:  BRAINARD, HIGHFILL NO.:  [id number]   MEDICAL RECORD NO.:  [id number]          PATIENT TYPE:  INP   LOCATION:  1602                         FACILITY:  [facility]   PHYSICIAN:  Ardeth Sportsman, MD     DATE OF BIRTH:  [birth year]   DATE OF ADMISSION:  11/10/2007  DATE OF DISCHARGE:  11/12/2007                               DISCHARGE SUMMARY   PRIMARY CARE PHYSICIAN:  Leanne Chang, M.D.   GASTROENTEROLOGIST:  Dr. Jarold Motto, [facility]   SURGEON:  Ardeth Sportsman, M.D.   FINAL DISCHARGE DIAGNOSES:  Recurrent diverticulitis.   OTHER DIAGNOSES:  1. Gastroesophageal reflux disease with hiatal hernia.  2. Anxiety.   PROCEDURES PERFORMED:  Laparoscopic-assisted sigmoid resection with  splenic flexure mobilization, 11/10/2007.   SUMMARY OF HOSPITAL COURSE:  Mr. Weidler is a pleasant, 55 year old male  with recurrent episodes of diverticulitis.  I saw him last year and he  was hesitant to have surgery, but had a recurrent attack and was  convinced by his gastroenterologist to reconsider surgery.  He underwent  laparoscopic resection two days ago.  Postoperatively, he was placed on  the enteric protocol with ON-Q pain pumps.  He had early ambulation.  By  the time of discharge, he was having flatus and a bowel movement.  He  was tolerating a solid diet well.  He did not have any nausea or  vomiting.  He was walking well in the hallways with adequate pain  control on oral medications.  He had good urine output, as well.   Based on these improvements, it was thought it would be reasonable to  discharge home with the following instructions.   DISCHARGE INSTRUCTIONS:  1. He is to return to clinic to see me in about two weeks.  2. He should continue on his Metamucil fiber diet with plenty of      liquids and avoid constipation and severe diarrhea, as well.  3. He should call me if he has any fevers, chills, sweats, worsening      nausea and vomiting,  uncontrolled pain, drainage from incisions or      other concerns.  4. He should resume his home medications to include:   1. Nexium 40 mg p.o. daily.  2. Melatonin 3 mg q.h.s.  3. GLUCOLD daily.  4. Vitamin D daily.  5. Immuno boost one daily.  6. Methyl B12 daily.  7. Centrum multivitamin daily.  8. Red yeast rice daily.  9. Fish oil omega 3 daily.  10.Xanax q.h.s. p.r.n.  11.Ambien q.h.s. p.r.n.  12.I also recommended, for pain control, he use ice packs or heating      pads every 2 hours.  13.Ibuprofen 400-800 mg p.o. q.a.c./q.h.s. times 5 days and then every      6 hours p.r.n.  14.Percocet one to two p.o. every 4 hours p.r.n. pain #50 written for.      Ardeth Sportsman, MD  Electronically Signed     SCG/MEDQ  D:  11/12/2007  T:  11/12/2007  Job:  [id number]   cc:   Leanne Chang,  M.D.  Fax: [phone]   Vania Rea. Jarold Motto, MD, FACG, FACP, FAGA  [address]  [place]  [place] [address]

## 2010-08-15 NOTE — Progress Notes (Signed)
This is a 55 year old Caucasian male with chronic acid reflux, currently doing well on Nexium 40 mg a day, but he does have breakthrough reflux symptoms especially at night. He denies dysphagia, hepatobiliary complaints, lower gastrointestinal issues, or any problems with liver disease. He has known nonspecific elevation of serum transaminases with a previous normal abdominal ultrasound, CT scan, liver biopsy, and multiple studies for metabolic liver disease. Does use ethanol moderately heavily. Previous CDT level was borderline at 1.4. He has had previous laparoscopic sigmoid colectomy because of recurrent diverticulitis. Previous endoscopic exam showed evidence of Barrett's mucosa.   Current Medications, Allergies, Past Medical History, Past Surgical History, Family History and Social History were reviewed in [employer] record.  Pertinent Review of Systems Negative   Physical Exam: Awake and alert in no acute distress. I cannot appreciate stigmata of chronic liver disease. There is no hepatosplenomegaly, abdominal masses or tenderness.    Assessment and Plan: We will change him to Dexilant 60 mg a day with Reglan 10 mg at bedtime with standard antireflux maneuvers. He is to call in 2 weeks time for progress report. He is due for endoscopy and colonoscopy in 3 years time.   Please copy her primary care physician, referring physician, and pertinent subspecialists.    Please copy her primary care physician, referring physician, and pertinent subspecial.

## 2010-08-15 NOTE — Op Note (Signed)
NAME:  Ryan Smith, Ryan Smith NO.:  [id number]   MEDICAL RECORD NO.:  [id number]          PATIENT TYPE:  INP   LOCATION:  0001                         FACILITY:  [facility]   PHYSICIAN:  Ardeth Sportsman, MD     DATE OF BIRTH:  1955-05-09   DATE OF PROCEDURE:  DATE OF DISCHARGE:                               OPERATIVE REPORT   PRIMARY CARE PHYSICIAN:  Leanne Chang, M.D.   GASTROENTEROLOGIST:  Vania Rea. Jarold Motto, MD, Carla Drape, with  [facility].   SURGEON:  Ardeth Sportsman, MD.   ASSISTANT:  Leonie Man, M.D.   PREOPERATIVE DIAGNOSIS:  Recurrent sigmoid diverticulitis.   POSTOPERATIVE DIAGNOSIS:  Recurrent sigmoid diverticulitis.   PROCEDURES PERFORMED:  1. Laparoscopic mobilization of splenic flexure of the colon.  2. Laparoscopically assisted sigmoid colectomy with primary 33 EEA      stapled anastomosis.   SPECIMEN:  Sigmoid colon.  Open end is proximal with anastomotic rings.   DRAINS:  None.   ESTIMATED BLOOD LOSS:  100 mL.   COMPLICATIONS:  No major complications.   ANESTHESIA:  1. General anesthesia.  2. Local anesthetic in a field block around all port sites.  3. On-Q pain continuous pain pump at Pfannenstiel suprapubic incision.   INDICATIONS:  Ryan Smith is a 55 year old male has had recurrent  episodes of diverticular attacks.  Dr. Jarold Motto evaluated him and by  colonoscopy-proven, no other significant etiology.  He was sent to me  back in the fall of 2008.  Recommended surgery at the time.  He was  initially hesitant.  However, a repeated attack back in April 2009, and  was sent back to me.  At this time, we could convince him to consider  surgery.   Anatomy and physiology of the digestive tract was explained.  Pathophysiology of diverticulitis with its risks of stricture  perforation recurrent attacks, etc.  Risks, benefits and alternatives  discussed.  Options discussed.  Recommendation was made for  laparoscopic,  possible open, sigmoid resection.  Risks, benefits, and  alternatives discussed in detail.  Questions answered and agreed to  proceed.   FINDINGS:  He had a segment of mid sigmoid colon adherent to the lateral  pelvic rim with inflammation consistent with diverticulitis.  There is  no other major inflammation or intra-abdominal adhesions.   DESCRIPTION OF PROCEDURE:  Informed consent was confirmed.  The patient  had a bowel prep since it was the left side colon.  He received 2 grams  IV cefoxitin just prior to surgery.  He had sequential pressure devices  active during the entire case.  He underwent general anesthesia without  any difficulty.  He underwent general esthesia without any difficulty.  He was positioned in low lithotomy with arms tucked.  His abdomen was  clipped, prepped and draped in sterile fashion.   A 5 mm port was placed in the right upper quadrant using optical entry  technique with the patient in steep reverse Trendelenburg and right side  up.  The capnoperitoneum 15 mmHg provided good intra-abdominal  insufflation.  Under sedation, a 5  mm port was placed in the right flank  and right lower quadrant.  Another one was placed periumbilically and  the left flank.   The patient was positioned head down, right side down.  Small bowel was  reflected out of the pelvis out of the way.  I could easily see a  sigmoid mesentery.  I went and scored the medial part of the base of the  sigmoid mesentery from the inferior mesenteric artery all the way down  to the pelvis.  I was able to get into the retromesenteric plane and  lift the sigmoid mesentery anteriorly for the classic medial lateral  approach.  I lifted the sigmoid mesentery out of the way.  I could see  retroperitoneal tubular structures and these were preserved.  I saw a  good candidate for the ureter, although initially I could not get a  classic vermiculation/peristalsis.  The further retroperitoneal  dissection  further up towards the splenic flexure.  I did not take any  mesentery at this time.  He had some dense adhesions of the sigmoid  colon and his left lower quadrant going over the pelvic brim.   At this point, because I was not certain exactly of where the ureter  was, I went ahead and mobilized the colon in lateral to medial fashion.  I started freeing the colon off around the junction between the  descending and sigmoid colon and mobilized it laterally.  I followed  that up towards the splenic flexure.  With the descending colon freed  off, I was able to come around behind and free the sigmoid mesentery off  its dense adhesions of primary epiploic appendages.  Once I freed that  off, I was able to free off the peritoneal coverings all way distally to  the rectum at the peritoneal reflection.   I was able mobilize the colon in a lateral medial fashion, free it all  the way over.  I could find and preserve the left ureter.  At this  point, I went ahead and created a 6-cm transverse suprapubic incision  and placed a GelPort through classic Pfannenstiel type incision.  I was  able to get my hand in there and help free things around.  I could feel  the kidney and follow a tubular structure consistent with the prior  identified ureter.  I was able to more proximally identify and get  vermification and follow it down and keep it preserved at all times.   I was able to get a window in the proximal rectum.  I used a contour  stapler to staple across at the junction between the sigmoid colon and  proximal rectum.  The sigmoid mesentery was taken using LigaSure all the  way down to where the window was.  I could see and preserve the ureter.  I followed up and did a ligation of the inferior mesenteric artery and  also on I&V branch.  That helped mobilize the colon.  However, I could  not get adequate mobilization down.   Therefore, I mobilized the splenic flexure of the colon by reflecting  the  sigmoid and descending colon over to the right flank.  I could get  my hand in the retromesenteric plane and easily free up the splenic  flexure of the colon and off its lateral attachments.  Fortunately, it  was not densely adherent to the spleen and I could keep that away.  I  used LigaSure to come around the horn.  I was able to free the proximal  transverse colon off the greater omentum and some of the retromesenteric  attachments over to midline.  This provided excellent mobilization of  the left side of the colon.   I was able to exteriorize the colon and get to a nice healthy soft  descending colon.  I went ahead and picked a location near the  descending/sigmoid junction.  I placed a bowel clamp proximally and a  Kelly clamp distally and transected it.  Sigmoid mesentery was taken  with LigaSure.  Specimen was sent off opened and proximal.   I used a bowel clamp to help keep the fecal contents from entering into  the wound.  I used EEA sizers and a 33 sizer easily fit into the wound.  I placed a 0 Prolene pursestring stitch in the anvil of the EEA stapler  into the open end of the descending colon and brought it back into the  abdomen and replaced the GelPort.   Dr. Lurene Shadow scrubbed down and did a finger anal dilation and rigid  proctoscopy.  There were no other abnormalities or concerns.  He could  easily get the proctoscope up to the rectal stump.  He was able to  advance the EEA stapler and bring the spike out just slightly anterior  to the staple line of the rectal stump.  Anvil was attached onto it and  the anvil was tightened onto the stapler, held in place for a minute,  fired, held in place for 30 seconds and released.  He had two excellent  anastomotic rings.  Rigid proctoscopy was repeated with the descending  colon clamped proximal to the anastomosis.  Insufflation was done across  the anastomosis.  He could see the anastomosis and it looked viable  without any  bleeding.  It was under water and there was no bubbling.  He  could good look around and anastomosis felt intact.  Gas was evacuated.   Copious irrigation was done, total 3 liters done with clear return in  all quadrants.  Because the case went smoothly with  minimal blood loss,  I did not feel drain was required.  The capnoperitoneum was evacuated  and ports were removed.  Pfannenstiel incision was reapproximated using  a 0 Vicryl peritoneal and vertical stitch and #1 running PDS stitch from  both corners.  Before I tied the fascial stitch down, I used the On-Q  pain pump catheter sleeve from the bilateral flanks and was able to get  into the preperitoneal space to help numb the lateral side ports and the  larger incision.  Fascial stitch was tied down.  Skin was closed using 4-  0 Monocryl stitch.  On-Q pain pump was set and started.  The patient was  extubated and sent to the recovery room in stable condition.   I am about to explain the operative findings to the patient's family.      Ardeth Sportsman, MD  Electronically Signed     SCG/MEDQ  D:  11/10/2007  T:  11/10/2007  Job:  [id number]   cc:   Leanne Chang, M.D.  Fax: [phone]   Vania Rea. Jarold Motto, MD, FACG, FACP, FAGA  [address]  [place]  [place] [address]

## 2010-08-15 NOTE — Assessment & Plan Note (Signed)
[facility]                         GASTROENTEROLOGY OFFICE NOTE   YAMEN, CASTROGIOVANNI                        MRN:          [id number]  DATE:10/24/2006                            DOB:          03/23/1956    PROBLEM LIST:  1. Mr. Blaker had a recent episode of diverticulitis and had to go      back on Cipro and Flagyl for 2 weeks.  He is currently      asymptomatic.  He has had several episodes over the last 2 years,      and I have advised him that he probably needs sigmoid resection.  I      have referred him to Dr. Karie Soda for a surgical consultation.      His abdominal exam today is basically unremarkable and his vital      signs are normal.  2. This patient on reviewing his chart has had abnormal liver function      tests now for several years of unexplained etiology.  Recent lab      data from his primary care physician shows on October 15, 2006, that      he had an SGOT of 59, an SGPT of 118.  These have been up and down      on review of his chart for several years.  I have done previous      endoscopy on him and he had acid reflux for which he takes daily      Nexium.  There was no evidence of varices.  He did have a CT scan      of his abdomen and pelvis in January 2006, which were unremarkable      except for diverticulitis.  At the time of his colonoscopy in      February 2008, he did have some rather significant left colon      tubular adenomas removed.  Endoscopy and esophageal biopsy showed      no evidence of Barrett's mucosa.   The patient denies any history of known hepatitis, denies any  hepatobiliary symptoms except for recent atypical chest pain which seems  to occur in acute episodes.  He has not had clay colored stools, dark  urine, icterus, fever, or chills with these episodes.   FAMILY HISTORY:  Noncontributory.   PHYSICAL EXAMINATION:  GENERAL:  I cannot appreciate stigmata of chronic  liver disease and there is  certainly no hepatosplenomegaly, abdominal  mass, tenderness, or ascites.  MENTAL STATUS:  Clear.   ASSESSMENT:  Mr. Hagy has persistently abnormal liver function tests  which require further evaluation to exclude underlying chronic bile or  metabolic liver problems.  It would be a most unusual presentation but I  think we need to exclude cholelithiasis and patches of small gallstones.   RECOMMENDATIONS:  1. Outpatient upper abdominal ultrasound exam.  2. Multiple lab tests for metabolic and viral liver diseases.  3. As per above, surgical referral to Dr. Michaell Cowing for consideration of      laparoscopic sigmoid resection.  4. Continue reflux regimen and Nexium therapy.  ADDENDUM:  Review of this patient's chart, he has had diverticulitis  going back to 1995, when he saw Dr. Francina Ames in a surgical  consultation.  At that time he was 55 years old.     Vania Rea. Jarold Motto, MD, Caleen Essex, FAGA  Electronically Signed    DRP/MedQ  DD: 10/24/2006  DT: 10/24/2006  Job #: [id number]

## 2010-08-18 NOTE — Assessment & Plan Note (Signed)
[facility]ALTH[facility]ARE                         GASTROENTEROLOGY OFFICE NOTE   Ryan Smith, Ryan Smith                        MRN:          [id number]  DATE:03/27/2006                            DOB:          02/22/1956    HISTORY:  This is a 55 year old white male with the history of recurrent  acute diverticulitis for which he has seen Dr. Jarold Motto.  Colonoscopy,  performed in October of 2001, revealed left-sided diverticulosis.  He  has had intermittent problems with acute diverticulitis, requiring  antibiotic therapy.  An office note from Dr. Jarold Motto, dated November 28, 2004, states that the patient should strongly consider surgical  treatment.  The patient called the office in November of 2006, and was  treated medically for diverticulitis.  He acknowledges that he was  advised to see a Careers adviser, though has failed to do so.  His current  history is that of four to five days of typical symptoms of  diverticulitis, manifested by focal left lower quadrant abdominal pain  and subjective fevers.  Also some difficulty moving his bowels.  No  nausea or vomiting.  Some evidence of peritoneal irritation, as he  notices focal discomfort with jarring movements or direct palpation.  He  has worsened over the past 24 hours and contacted the office today.  He  is worked into the office schedule.  His only other issue is that of  chronic reflux disease for which he has been on Nexium.  He states that  his primary care Navid Lenzen is retiring or relocating and wonders if he  might not get a prescription for Nexium.  He has not had screening upper  endoscopy.  He denies dysphagia.   CURRENT MEDICATIONS:  None.   ALLERGIES:  None.   PHYSICAL EXAMINATION:  Well-appearing male, in no acute distress.  Blood  pressure 124/82, heart rate is 88 and regular, weight is 158 pounds.  HEENT:  Sclerae are anicteric.  LUNGS:  Clear.  HEART:  Regular.  ABDOMEN:  Soft with focal tenderness  in the left lower quadrant to  palpation.  No mass.  Good bowel sounds heard.   IMPRESSION:  1. Recurrent acute diverticulitis.  2. Chronic reflux disease.   RECOMMENDATIONS:  1. Ciprofloxacin 500 mg p.o. b.i.d. for ten days.  2. Metronidazole 500 mg p.o. b.i.d. for ten days.  3. Modified diet to include liquids for 24-48 hours, then advance to      low-residue as tolerated.  4. Darvocet p.r.n. pain.  5. Ten days of Nexium samples provided for chronic reflux.  6. Office followup with Dr. Jarold Motto next week to assess response to      treatment for diverticulitis, as well as consider screening upper      endoscopy for chronic reflux disease.      The patient knows to contact the office or the on-call physician in      the interim, should his diverticular symptoms worsen despite      medical therapy.     Wilhemina Bonito. Marina Goodell, MD  Electronically Signed    JNP/MedQ  DD: 03/27/2006  DT:  03/27/2006  Job #: [id number]   cc:   Vania Rea. Jarold Motto, MD, Caleen Essex, FAGA

## 2010-08-18 NOTE — Assessment & Plan Note (Signed)
[facility]                         GASTROENTEROLOGY OFFICE NOTE   Ryan, Smith                        MRN:          [id number]  DATE:04/11/2006                            DOB:          1955/09/06    PROBLEMS:  1. Recent diverticulitis.  2. Chronic reflux.   HISTORY:  Ryan Smith is a pleasant 55 year old white male known to Dr.  Jarold Motto, who has a history of recurrent diverticulitis.  He was last  seen December 26 by Dr. Marina Goodell in Dr. Norval Gable absence.  At that time  he was treated for a flare of diverticulitis with a course of Cipro and  Flagyl for 10 days.  His symptoms resolved and he says he feels much  better at this time.  He is not having any abdominal pain.  He says his  bowel habits are normal.  Has not noted any melena or hematochezia.  He  has gone back on his daily Metamucil.  His last episode of  diverticulitis was approximately 1 year ago.   The patient does relate problems with chronic reflux and says that he  has had symptoms off and on for years, but had never been on any regular  medication.  He was given Nexium per Dr. Marina Goodell and says that this worked  very well, and that he would like to try a prescription medication at  this time.  He says he has symptoms at least several times per week,  usually late in the evenings, often preventing him from going to sleep,  and at times, waking him up from sleep.  He denies any dysphagia or  odynophagia.  On further questioning, he relates that his father did  have Barrett's esophageus.   CURRENT MEDICATIONS:  None currently.   ALLERGIES:  No known drug allergies.   EXAM:  Well-developed white male in no acute distress.  Weight 157.2 pounds, blood pressure 130/82, pulse 68.  CARDIOVASCULAR:  Regular rate and rhythm with S1 and S2.  PULMONARY:  Clear to A and P.  ABDOMEN:  Soft.  Bowel sounds are active.  He is nontender.  There is no  mass or hepatosplenomegaly.  No guarding or  rebound.   IMPRESSION:  28. A 55 year old male with recurrent diverticulitis, resolved.  2. Chronic gastroesophageal reflux disease, rule out Barrett's.   PLAN:  1. Continue daily Metamucil.  2. Schedule colonoscopy for followup, last colonoscopy was done      October 2001 showing only diverticular disease.  3. Schedule EGD to rule out Barrett's.  4. The patient was given samples and a prescription for Nexium 40 mg      q. a.m.      [name], PA-C  Electronically Signed      Vania Rea. Jarold Motto, MD, Caleen Essex, FAGA  Electronically Signed   AE/MedQ  DD: 04/11/2006  DT: 04/11/2006  Job #: [id number]

## 2010-08-23 ENCOUNTER — Other Ambulatory Visit: Payer: Self-pay | Admitting: Dermatology

## 2010-08-23 ENCOUNTER — Telehealth: Payer: Self-pay | Admitting: Gastroenterology

## 2010-08-23 NOTE — Telephone Encounter (Signed)
Left message that i have submitted all the information to the insurance company and they have 72 more hours to make a decision. I will contact him when they have made a decision if he needs samples he can call back,

## 2010-08-24 ENCOUNTER — Telehealth: Payer: Self-pay | Admitting: Gastroenterology

## 2010-08-24 MED ORDER — PANTOPRAZOLE SODIUM 40 MG PO TBEC: 40.0000 mg | DELAYED_RELEASE_TABLET | Freq: Every day | ORAL | Status: DC

## 2010-08-24 NOTE — Telephone Encounter (Signed)
Left message Dexilant has been denied and I have called in protonix generic and he can pick it up today.

## 2010-10-25 ENCOUNTER — Other Ambulatory Visit: Payer: Self-pay | Admitting: Gastroenterology

## 2010-11-01 ENCOUNTER — Other Ambulatory Visit: Payer: Self-pay | Admitting: Family Medicine

## 2010-11-01 MED ORDER — ALPRAZOLAM 0.5 MG PO TABS: 0.5000 mg | ORAL_TABLET | Freq: Three times a day (TID) | ORAL | Status: DC | PRN

## 2010-11-01 NOTE — Telephone Encounter (Signed)
Last OV- 02/14/10 last filled 07/03/10- 60,1

## 2010-12-21 ENCOUNTER — Other Ambulatory Visit: Payer: Self-pay | Admitting: Gastroenterology

## 2010-12-29 LAB — CBC
HCT: 48.5
Hemoglobin: 16.8
MCHC: 34.6
MCV: 94.1
Platelets: 300
RBC: 5.15
RDW: 13.7
WBC: 5.9

## 2011-01-12 LAB — CBC
HCT: 44.8
Hemoglobin: 15.3
MCHC: 34.1
MCV: 92.4
Platelets: 320
RBC: 4.85
RDW: 13.2
WBC: 4.9

## 2011-01-12 LAB — PROTIME-INR
INR: 0.9
Prothrombin Time: 11.9

## 2011-02-27 ENCOUNTER — Other Ambulatory Visit: Payer: Self-pay | Admitting: Family Medicine

## 2011-02-27 NOTE — Telephone Encounter (Signed)
Last seen 02/14/10 and filled 11/01/10 #60 with 1 refill--No pending apts. Please advise    KP

## 2011-02-27 NOTE — Telephone Encounter (Signed)
[place] Pt please advise

## 2011-03-09 ENCOUNTER — Telehealth: Payer: Self-pay | Admitting: Internal Medicine

## 2011-03-09 MED ORDER — ALPRAZOLAM 0.5 MG PO TABS: 0.5000 mg | ORAL_TABLET | Freq: Three times a day (TID) | ORAL | Status: DC | PRN

## 2011-03-09 NOTE — Telephone Encounter (Signed)
Pt aware, Rx sent to pharmacy

## 2011-03-09 NOTE — Telephone Encounter (Signed)
Patient wants refill xanax - [facility] [address] - he scheduled a cpx [phone] - wants to know if he can have enough till then -

## 2011-03-09 NOTE — Telephone Encounter (Signed)
Last Filled 10-31-09 #60 1, last OV 02-14-10 cpx

## 2011-03-09 NOTE — Telephone Encounter (Signed)
Ok #30, no RF 

## 2011-03-20 ENCOUNTER — Encounter: Payer: Self-pay | Admitting: Internal Medicine

## 2011-03-21 ENCOUNTER — Encounter: Payer: BC Managed Care – PPO | Admitting: Internal Medicine

## 2011-04-10 ENCOUNTER — Ambulatory Visit (INDEPENDENT_AMBULATORY_CARE_PROVIDER_SITE_OTHER): Payer: BC Managed Care – PPO | Admitting: Internal Medicine

## 2011-04-10 DIAGNOSIS — Z0000 Encounter for general adult medical examination without abnormal findings: Secondary | ICD-10-CM

## 2011-04-10 DIAGNOSIS — K219 Gastro-esophageal reflux disease without esophagitis: Secondary | ICD-10-CM

## 2011-04-10 LAB — CBC WITH DIFFERENTIAL/PLATELET
Basophils Absolute: 0 10*3/uL (ref 0.0–0.1)
Basophils Relative: 0.2 % (ref 0.0–3.0)
Eosinophils Absolute: 0.1 10*3/uL (ref 0.0–0.7)
Eosinophils Relative: 1.3 % (ref 0.0–5.0)
HCT: 47.2 % (ref 39.0–52.0)
Hemoglobin: 16.1 g/dL (ref 13.0–17.0)
Lymphocytes Relative: 23.6 % (ref 12.0–46.0)
Lymphs Abs: 1.7 10*3/uL (ref 0.7–4.0)
MCHC: 34.1 g/dL (ref 30.0–36.0)
MCV: 93.8 fl (ref 78.0–100.0)
Monocytes Absolute: 0.8 10*3/uL (ref 0.1–1.0)
Monocytes Relative: 10.9 % (ref 3.0–12.0)
Neutro Abs: 4.6 10*3/uL (ref 1.4–7.7)
Neutrophils Relative %: 64 % (ref 43.0–77.0)
Platelets: 335 10*3/uL (ref 150.0–400.0)
RBC: 5.03 Mil/uL (ref 4.22–5.81)
RDW: 13.3 % (ref 11.5–14.6)
WBC: 7.2 10*3/uL (ref 4.5–10.5)

## 2011-04-10 LAB — COMPREHENSIVE METABOLIC PANEL
ALT: 75 U/L — ABNORMAL HIGH (ref 0–53)
AST: 36 U/L (ref 0–37)
Albumin: 4.6 g/dL (ref 3.5–5.2)
Alkaline Phosphatase: 85 U/L (ref 39–117)
BUN: 18 mg/dL (ref 6–23)
CO2: 28 mEq/L (ref 19–32)
Calcium: 9.8 mg/dL (ref 8.4–10.5)
Chloride: 105 mEq/L (ref 96–112)
Creatinine, Ser: 0.9 mg/dL (ref 0.4–1.5)
GFR: 92.69 mL/min (ref >60.00)
Glucose, Bld: 98 mg/dL (ref 70–99)
Potassium: 4.9 mEq/L (ref 3.5–5.1)
Sodium: 140 mEq/L (ref 135–145)
Total Bilirubin: 0.9 mg/dL (ref 0.3–1.2)
Total Protein: 7.3 g/dL (ref 6.0–8.3)

## 2011-04-10 LAB — LIPID PANEL
Cholesterol: 216 mg/dL — ABNORMAL HIGH (ref 0–200)
HDL: 44.1 mg/dL (ref >39.00)
Total CHOL/HDL Ratio: 5
Triglycerides: 136 mg/dL (ref 0.0–149.0)
VLDL: 27.2 mg/dL (ref 0.0–40.0)

## 2011-04-10 LAB — LDL CHOLESTEROL, DIRECT: Direct LDL: 145.2 mg/dL

## 2011-04-10 LAB — TSH: TSH: 1.66 u[IU]/mL (ref 0.35–5.50)

## 2011-04-10 LAB — PSA: PSA: 3.32 ng/mL (ref 0.10–4.00)

## 2011-04-10 MED ORDER — ALPRAZOLAM 0.5 MG PO TABS: 0.5000 mg | ORAL_TABLET | Freq: Three times a day (TID) | ORAL | Status: DC | PRN

## 2011-04-10 NOTE — Assessment & Plan Note (Signed)
Symptoms not completely well well controlled, recommend patient to see GI

## 2011-04-10 NOTE — Assessment & Plan Note (Addendum)
Td  2007  Had a flu shot  last colonoscopy 10/2009, polyp, next 2016 counseling: diet, exercise , tobacco risks, advised to quit. Labs Last LDL was 150, ideal LDL is 629. Is likely that he will need medication, patient aware BP slightly elevated, recommend to come back in 3  months, see instructions

## 2011-04-10 NOTE — Progress Notes (Signed)
  Subjective:    Patient ID: Ryan Smith, male    DOB: 04/04/1955, 56 y.o.   MRN: [id number]  HPI CPX  Past Medical History: Anxiety Hearing loss, has B hearing aids  GI, Dr Jarold Motto Colonic polyps Tubular adenomas 2008 & hyperplastic 2011 Diverticulitis, hx of GERD, Barrett's esophagus for EGD 9-11, next 3 years increased LFTs:  2008 neg. hep. 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 A lot of stress at work but he is managing okay so far. No depression. He is taking dexilant, he added Pepcid at night, however his GERD symptoms are not completely well controlled. No dysphagia or odynophagia per se. No chest pain or shortness of breath No nausea, vomiting, diarrhea or blood in the stools.     Objective:   Physical Exam  Constitutional: He is oriented to person, place, and time. He appears well-developed and well-nourished.  HENT:  Head: Normocephalic and atraumatic.  Neck: No thyromegaly present.  Cardiovascular: Normal rate, regular rhythm and normal heart sounds.   No murmur heard. Pulmonary/Chest: Effort normal and breath sounds normal. No respiratory distress. He has no wheezes. He has no rales.  Abdominal: Soft. Bowel sounds are normal. He exhibits no distension. There is no tenderness.  There is no rebound and no guarding.       Has a 2 cm umbilical hernia, reducible. (Per patient, asymptomatic)  Genitourinary: Rectum normal and prostate normal.       Small external hemorrhoids noted  Musculoskeletal: He exhibits no edema.  Neurological: He is alert and oriented to person, place, and time.  Psychiatric: He has a normal mood and affect. His behavior is normal. Judgment and thought content normal.        Assessment & Plan:

## 2011-04-10 NOTE — Patient Instructions (Signed)
Please see Dr. Jarold Motto Your BP slightly elevated today, BP goal is 130/80----> please  come back in 3 months for a checkup

## 2011-04-11 ENCOUNTER — Encounter: Payer: Self-pay | Admitting: Internal Medicine

## 2011-04-17 ENCOUNTER — Telehealth: Payer: Self-pay | Admitting: *Deleted

## 2011-04-17 DIAGNOSIS — E785 Hyperlipidemia, unspecified: Secondary | ICD-10-CM

## 2011-04-17 MED ORDER — ROSUVASTATIN CALCIUM 5 MG PO TABS: 5.0000 mg | ORAL_TABLET | Freq: Every day | ORAL | Status: DC

## 2011-04-17 NOTE — Telephone Encounter (Signed)
Notes Recorded by Wanda Plump, MD on 04/14/2011 at 9:09 AM Advise patient: Willlesterol continues to be elevated with an LDL of 145. Liver tests at baseline Other labs normal including his PSA.Marland Kitchen Plan: Start Crestor 5 mg 1 by mouth each bedtime, provide a five-week supply of samples FLP, AST, ALT in one month dx will Hyperlipidemia   LMOM to inform patient w/contact name & number. Rx sent to pharmacy, Lab orders placed in Epic.

## 2011-04-20 ENCOUNTER — Other Ambulatory Visit: Payer: Self-pay | Admitting: Gastroenterology

## 2011-06-03 ENCOUNTER — Telehealth: Payer: Self-pay | Admitting: Internal Medicine

## 2011-06-03 NOTE — Telephone Encounter (Signed)
Needs FLP AST ALT--dx hyperlipidemia Please arrange

## 2011-06-14 NOTE — Telephone Encounter (Signed)
Please call patient, see below.

## 2011-06-15 NOTE — Telephone Encounter (Signed)
LMOVM for pt to return call 

## 2011-06-19 NOTE — Telephone Encounter (Signed)
LMOVM for pt to call the office.  ?

## 2011-06-24 NOTE — Telephone Encounter (Signed)
Send a letter, ask him to contact us ref labs

## 2011-06-25 ENCOUNTER — Encounter: Payer: Self-pay | Admitting: *Deleted

## 2011-06-25 NOTE — Telephone Encounter (Signed)
Mailed letter °

## 2011-07-30 ENCOUNTER — Encounter: Payer: Self-pay | Admitting: *Deleted

## 2011-07-31 ENCOUNTER — Encounter: Payer: Self-pay | Admitting: Gastroenterology

## 2011-07-31 ENCOUNTER — Ambulatory Visit (INDEPENDENT_AMBULATORY_CARE_PROVIDER_SITE_OTHER): Payer: BC Managed Care – PPO | Admitting: Gastroenterology

## 2011-07-31 VITALS — BP 114/80 | HR 76 | Ht 70.0 in | Wt 177.1 lb

## 2011-07-31 DIAGNOSIS — K429 Umbilical hernia without obstruction or gangrene: Secondary | ICD-10-CM

## 2011-07-31 DIAGNOSIS — K219 Gastro-esophageal reflux disease without esophagitis: Secondary | ICD-10-CM

## 2011-07-31 MED ORDER — SUCRALFATE 1 G PO TABS: 1.0000 g | ORAL_TABLET | Freq: Every day | ORAL | Status: DC

## 2011-07-31 NOTE — Progress Notes (Signed)
This is a 56 year old Caucasian male with chronic acid reflux and Barrett's esophagus. He continued to have breakthrough symptoms despite Dexilant 60 mg every morning, Reglan 10 mg at bedtime, and when necessary Pepcid. He denies dysphagia, hepatobiliary or lower gastrointestinal problems. There is no history of Raynaud's phenomena more collagen vascular disease. He is up-to-date on his endoscopy and colonoscopies. His appetite is good and his weight is stable. He does not smoke or abuse NSAIDs or alcohol.  Current Medications, Allergies, Past Medical History, Past Surgical History, Family History and Social History were reviewed in [employer] record.  Pertinent Review of Systems Negative   Physical Exam: Healthy-appearing patient in no distress. Blood pressure 114/8, pulse 76, BMI 25.41. I cannot appreciate stigmata of chronic liver disease. Chest is clear he is in a regular rhythm without murmurs gallops or rubs. He does have an umbilical hernia but no organomegaly, masses or tenderness. Bowel sounds are nonobstructive. Mental status is normal. Peripheral extremities are unremarkable.    Assessment and Plan: Chronic GERD worse at bedtime. We will continue his current regime but add Carafate 1 g at bedtime to see how he does symptomatically. He is due for followup endoscopy and dysplasia screening next year. He is otherwise to continue his medications as listed and reviewed. I do not think he needs fundoplication at this time. No diagnosis found.

## 2011-07-31 NOTE — Patient Instructions (Addendum)
You have been given a separate informational sheet regarding your tobacco use, the importance of quitting and local resources to help you quit. Take Dexilant in the morning. Take Reglan and Carafate at bedtime.  You will need a Endoscopy in 10/2012 we will mail you a reminder letter. Your prescription(s) have been sent to you pharmacy.

## 2011-08-24 ENCOUNTER — Other Ambulatory Visit: Payer: Self-pay | Admitting: Gastroenterology

## 2011-09-14 ENCOUNTER — Other Ambulatory Visit: Payer: Self-pay | Admitting: Dermatology

## 2011-09-24 ENCOUNTER — Other Ambulatory Visit: Payer: Self-pay | Admitting: Internal Medicine

## 2011-09-24 MED ORDER — ALPRAZOLAM 0.5 MG PO TABS: 0.5000 mg | ORAL_TABLET | Freq: Three times a day (TID) | ORAL | Status: DC | PRN

## 2011-09-24 NOTE — Telephone Encounter (Signed)
Ok to refill 

## 2011-09-24 NOTE — Telephone Encounter (Signed)
refill alprazolam 0.5mg  tablet, take one tablet by mouth 3-times a day as needed for sleep anxiety  Last wrt 1.8.13 qty 90, last ov 1.8.13

## 2011-09-24 NOTE — Telephone Encounter (Signed)
Refill done. Left detailed msg on vmail to call & schedule OV.

## 2011-09-24 NOTE — Telephone Encounter (Signed)
#  90, no refills. Also patient is due for a office visit to recheck his BP and cholesterol, arrange a visit

## 2011-10-03 ENCOUNTER — Ambulatory Visit (INDEPENDENT_AMBULATORY_CARE_PROVIDER_SITE_OTHER): Payer: BC Managed Care – PPO | Admitting: Internal Medicine

## 2011-10-03 ENCOUNTER — Encounter: Payer: Self-pay | Admitting: Internal Medicine

## 2011-10-03 VITALS — BP 132/86 | HR 69 | Temp 98.4°F | Wt 174.0 lb

## 2011-10-03 DIAGNOSIS — E785 Hyperlipidemia, unspecified: Secondary | ICD-10-CM

## 2011-10-03 DIAGNOSIS — K219 Gastro-esophageal reflux disease without esophagitis: Secondary | ICD-10-CM

## 2011-10-03 HISTORY — DX: Hyperlipidemia, unspecified: E78.5

## 2011-10-03 LAB — LIPID PANEL
Cholesterol: 182 mg/dL (ref 0–200)
HDL: 47.6 mg/dL (ref >39.00)
LDL Cholesterol: 115 mg/dL — ABNORMAL HIGH (ref 0–99)
Total CHOL/HDL Ratio: 4
Triglycerides: 96 mg/dL (ref 0.0–149.0)
VLDL: 19.2 mg/dL (ref 0.0–40.0)

## 2011-10-03 MED ORDER — RANITIDINE HCL 150 MG PO TABS: 150.0000 mg | ORAL_TABLET | Freq: Every day | ORAL | Status: DC

## 2011-10-03 NOTE — Assessment & Plan Note (Signed)
GI added Carafate, patient self discontinue it due to constipation. Currently on dexilant, reglan and Zantac--- doing well

## 2011-10-03 NOTE — Progress Notes (Signed)
  Subjective:    Patient ID: Ryan Smith, male    DOB: 05-04-1955, 56 y.o.   MRN: [id number]  HPI Routine visit Hyperlipidemia, based on the last cholesterol panel I recommended Crestor.. The patient decided not to take it GERD, symptoms were not controlled, GI added Carafate the patient discontinued year 2 constipation. He is now on over-the-counter Zantac with very good results.  Past Medical History: Anxiety Hearing loss, has B hearing aids   GI, Dr Jarold Motto Colonic polyps Tubular adenomas 2008 & hyperplastic 2011 Diverticulitis, hx of GERD, Barrett's esophagus for EGD 9-11, next 3 years increased LFTs:  2008 neg. hep. 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     Objective:   Physical Exam General -- alert, well-developed. No apparent distress.  Lungs -- normal respiratory effort, no intercostal retractions, no accessory muscle use, and normal breath sounds.   Heart-- normal rate, regular rhythm, no murmur, and no gallop.   Psych-- Cognition and judgment appear intact. Alert and cooperative with normal attention span and concentration.  not anxious appearing and not depressed appearing.      Assessment & Plan:

## 2011-10-03 NOTE — Assessment & Plan Note (Addendum)
Given family history of heart disease LDL goal is around 100, reasons behind that goal discussed with the patient. He is somehow concerned about statins, I discussed with him the pros and cons. Plan: Labs, consider meds.

## 2011-10-08 ENCOUNTER — Encounter: Payer: Self-pay | Admitting: *Deleted

## 2011-10-14 ENCOUNTER — Other Ambulatory Visit: Payer: Self-pay | Admitting: Gastroenterology

## 2011-10-31 ENCOUNTER — Ambulatory Visit (INDEPENDENT_AMBULATORY_CARE_PROVIDER_SITE_OTHER): Payer: BC Managed Care – PPO | Admitting: Internal Medicine

## 2011-10-31 ENCOUNTER — Encounter: Payer: Self-pay | Admitting: Internal Medicine

## 2011-10-31 VITALS — BP 118/82 | HR 80 | Temp 98.0°F | Ht 69.5 in | Wt 169.0 lb

## 2011-10-31 DIAGNOSIS — T169XXA Foreign body in ear, unspecified ear, initial encounter: Secondary | ICD-10-CM

## 2011-10-31 DIAGNOSIS — H6120 Impacted cerumen, unspecified ear: Secondary | ICD-10-CM

## 2011-10-31 NOTE — Progress Notes (Signed)
  Subjective:    Patient ID: Ryan Smith, male    DOB: [birth year], 56 y.o.   MRN: [id number]  HPI Acute visit One-week history of feeling stopped up at the right year, "feeling wet there".   Past Medical History: Anxiety Hearing loss, has B hearing aids   GI, Dr Jarold Motto Colonic polyps Tubular adenomas 2008 & hyperplastic 2011 Diverticulitis, hx of GERD, Barrett's esophagus for EGD 9-11, next 3 years increased LFTs:  2008 neg. hep. 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 He uses hearing aids.    Objective:   Physical Exam  Alert oriented x3 no apparent distress. Left year, the tip of his hearing aid is at the ear canal Right year, the tip of the hearing aid is at the ear canal, trago sign +, no d/c seen  I removed the FB without any difficulty from the left year. Ear canal was noted to be normal. I also removed the FB from the right year, he had abundant dried wax, probably 50% of it was removed with a spoon. There was no discharge, trago sign after the prcedure was (-).       Assessment & Plan:   1. FB at the ears 2. cerumen impacted patient Patient felt a lot better once I removed a right ear FB, he has some residual cerumen impact. Recommend to use peroxide for few days and if he is unable to remove all the wax, he will call me. He also decided to change the brand of his  hearing aid tips. At this point, I don't think there is an infection at the right ear.

## 2011-11-06 ENCOUNTER — Encounter: Payer: Self-pay | Admitting: Family Medicine

## 2011-11-06 ENCOUNTER — Ambulatory Visit (INDEPENDENT_AMBULATORY_CARE_PROVIDER_SITE_OTHER): Payer: BC Managed Care – PPO | Admitting: Family Medicine

## 2011-11-06 VITALS — BP 120/76 | HR 79 | Temp 98.4°F | Wt 170.8 lb

## 2011-11-06 DIAGNOSIS — H6120 Impacted cerumen, unspecified ear: Secondary | ICD-10-CM

## 2011-11-06 NOTE — Progress Notes (Signed)
  Subjective:    Patient ID: Ryan Smith, male    DOB: [birth year], 56 y.o.   MRN: [id number]  HPI Pt here c/o fullness in R ear.  He has some wax removed at last visit and tip of hearing aid as well.   He has been using peroxide and water in it but could not get if out.      Review of Systems as asbove   Objective:   Physical Exam  Constitutional: He is oriented to person, place, and time. He appears well-developed and well-nourished.  HENT:       R ear-- + cerumen impaction---irrigated after he sat with colace in ear--  Irrigated with no problems TM and canal clear  Neurological: He is alert and oriented to person, place, and time.  Psychiatric: He has a normal mood and affect. His behavior is normal. Judgment and thought content normal.          Assessment & Plan:  Cerumen impaction---  Ear irrigated in office with no complications

## 2011-11-06 NOTE — Patient Instructions (Signed)
Cerumen Impaction  A cerumen impaction is when the wax in your ear forms a plug. This plug usually causes reduced hearing. Sometimes it also causes an earache or dizziness. Removing a cerumen impaction can be difficult and painful. The wax sticks to the ear canal. The canal is sensitive and bleeds easily. If you try to remove a heavy wax buildup with a cotton tipped swab, you may push it in further.  Irrigation with water, suction, and small ear curettes may be used to clear out the wax. If the impaction is fixed to the skin in the ear canal, ear drops may be needed for a few days to loosen the wax. People who build up a lot of wax frequently can use ear wax removal products available in your local drugstore.  SEEK MEDICAL CARE IF:    You develop an earache, increased hearing loss, or marked dizziness.  Document Released: 04/26/2004 Document Revised: 03/08/2011 Document Reviewed: 06/16/2009  [employer] Patient Information 2012 [employer].

## 2011-11-26 ENCOUNTER — Telehealth: Payer: Self-pay | Admitting: Internal Medicine

## 2011-11-26 MED ORDER — ALPRAZOLAM 0.5 MG PO TABS: 0.5000 mg | ORAL_TABLET | Freq: Three times a day (TID) | ORAL | Status: DC | PRN

## 2011-11-26 NOTE — Telephone Encounter (Signed)
Ok to refill 

## 2011-11-26 NOTE — Telephone Encounter (Signed)
90, 1 RF 

## 2011-11-26 NOTE — Telephone Encounter (Signed)
Refill: Alprazolam 0.5mg  tablet. Take 1 tablet by mouth 3 times a day as needed for sleep or anxiety. Last fill 09-24-11

## 2011-11-26 NOTE — Telephone Encounter (Signed)
Refill done.  

## 2011-12-26 ENCOUNTER — Other Ambulatory Visit: Payer: Self-pay | Admitting: Gastroenterology

## 2012-04-06 ENCOUNTER — Other Ambulatory Visit: Payer: Self-pay | Admitting: Internal Medicine

## 2012-04-07 ENCOUNTER — Encounter: Payer: Self-pay | Admitting: *Deleted

## 2012-04-07 NOTE — Telephone Encounter (Signed)
Ok to refill? Last OV 7.31.13 Last filled 8.26.13 #90 1RF

## 2012-04-07 NOTE — Telephone Encounter (Signed)
Pt made aware

## 2012-04-07 NOTE — Telephone Encounter (Signed)
Advise patient, he is due for a physical. Refill done

## 2012-04-29 ENCOUNTER — Other Ambulatory Visit: Payer: Self-pay | Admitting: Dermatology

## 2012-05-17 ENCOUNTER — Other Ambulatory Visit: Payer: Self-pay

## 2012-06-01 ENCOUNTER — Other Ambulatory Visit: Payer: Self-pay | Admitting: Gastroenterology

## 2012-06-02 NOTE — Telephone Encounter (Signed)
PATIENT NEEDS OFFICE VISIT FOR FURTHER REFILLS  

## 2012-06-08 ENCOUNTER — Other Ambulatory Visit: Payer: Self-pay | Admitting: Gastroenterology

## 2012-06-09 NOTE — Telephone Encounter (Signed)
PATIENT NEEDS OFFICE VISIT FOR FURTHER REFILLS  

## 2012-06-16 ENCOUNTER — Encounter: Payer: Self-pay | Admitting: Lab

## 2012-06-17 ENCOUNTER — Ambulatory Visit (INDEPENDENT_AMBULATORY_CARE_PROVIDER_SITE_OTHER): Payer: BC Managed Care – PPO | Admitting: Internal Medicine

## 2012-06-17 ENCOUNTER — Encounter: Payer: Self-pay | Admitting: Internal Medicine

## 2012-06-17 VITALS — BP 126/82 | HR 82 | Temp 98.2°F | Ht 70.0 in | Wt 166.0 lb

## 2012-06-17 DIAGNOSIS — F411 Generalized anxiety disorder: Secondary | ICD-10-CM

## 2012-06-17 DIAGNOSIS — K2270 Barrett's esophagus without dysplasia: Secondary | ICD-10-CM

## 2012-06-17 DIAGNOSIS — E785 Hyperlipidemia, unspecified: Secondary | ICD-10-CM

## 2012-06-17 DIAGNOSIS — Z0000 Encounter for general adult medical examination without abnormal findings: Secondary | ICD-10-CM

## 2012-06-17 LAB — CBC WITH DIFFERENTIAL/PLATELET
Basophils Absolute: 0 10*3/uL (ref 0.0–0.1)
Basophils Relative: 0.4 % (ref 0.0–3.0)
Eosinophils Absolute: 0 10*3/uL (ref 0.0–0.7)
Eosinophils Relative: 0.5 % (ref 0.0–5.0)
HCT: 45.6 % (ref 39.0–52.0)
Hemoglobin: 15.4 g/dL (ref 13.0–17.0)
Lymphocytes Relative: 22.6 % (ref 12.0–46.0)
Lymphs Abs: 1.5 10*3/uL (ref 0.7–4.0)
MCHC: 33.8 g/dL (ref 30.0–36.0)
MCV: 91.9 fl (ref 78.0–100.0)
Monocytes Absolute: 0.6 10*3/uL (ref 0.1–1.0)
Monocytes Relative: 9.6 % (ref 3.0–12.0)
Neutro Abs: 4.3 10*3/uL (ref 1.4–7.7)
Neutrophils Relative %: 66.9 % (ref 43.0–77.0)
Platelets: 332 10*3/uL (ref 150.0–400.0)
RBC: 4.96 Mil/uL (ref 4.22–5.81)
RDW: 13.1 % (ref 11.5–14.6)
WBC: 6.4 10*3/uL (ref 4.5–10.5)

## 2012-06-17 LAB — LIPID PANEL
Cholesterol: 190 mg/dL (ref 0–200)
HDL: 49.4 mg/dL (ref >39.00)
LDL Cholesterol: 121 mg/dL — ABNORMAL HIGH (ref 0–99)
Total CHOL/HDL Ratio: 4
Triglycerides: 96 mg/dL (ref 0.0–149.0)
VLDL: 19.2 mg/dL (ref 0.0–40.0)

## 2012-06-17 LAB — COMPREHENSIVE METABOLIC PANEL
ALT: 85 U/L — ABNORMAL HIGH (ref 0–53)
AST: 41 U/L — ABNORMAL HIGH (ref 0–37)
Albumin: 4.3 g/dL (ref 3.5–5.2)
Alkaline Phosphatase: 96 U/L (ref 39–117)
BUN: 10 mg/dL (ref 6–23)
CO2: 26 mEq/L (ref 19–32)
Calcium: 9.3 mg/dL (ref 8.4–10.5)
Chloride: 104 mEq/L (ref 96–112)
Creatinine, Ser: 1 mg/dL (ref 0.4–1.5)
GFR: 86.72 mL/min (ref >60.00)
Glucose, Bld: 108 mg/dL — ABNORMAL HIGH (ref 70–99)
Potassium: 4.6 mEq/L (ref 3.5–5.1)
Sodium: 138 mEq/L (ref 135–145)
Total Bilirubin: 0.8 mg/dL (ref 0.3–1.2)
Total Protein: 6.9 g/dL (ref 6.0–8.3)

## 2012-06-17 LAB — TSH: TSH: 1.54 u[IU]/mL (ref 0.35–5.50)

## 2012-06-17 LAB — PSA: PSA: 2.73 ng/mL (ref 0.10–4.00)

## 2012-06-17 NOTE — Assessment & Plan Note (Addendum)
Due for a EGD 12-2012 ---> pt aware, GERD sx well controlled

## 2012-06-17 NOTE — Patient Instructions (Addendum)
Next visit 6 months, please make an appointment

## 2012-06-17 NOTE — Assessment & Plan Note (Signed)
Goal LDL ~100, was recommended crestor but never tried, he did get his cholesterol down w/ red rice yeast. Plan: labs

## 2012-06-17 NOTE — Progress Notes (Signed)
  Subjective:    Patient ID: Ryan Smith, male    DOB: 1955-05-12, 57 y.o.   MRN: [id number]  HPI  Complete physical exam  Past Medical History  Diagnosis Date  . Personal history of colonic polyps     tublar adenomas 2008 & hyerplastic 2011  . Anxiety   . History of diverticulitis of colon   . Esophageal reflux   . Barrett's esophagus   . Hearing loss   . Abnormal LFTs     2008 neg. hep. serology, (-) ANA, ceruloplasmin, etc.;  liver biopsy showed  minimal active inflammation  . Hiatal hernia    Past Surgical History  Procedure Laterality Date  . Liver biopsy      minimal active inflammation  . 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 Xanax does help. Denies any suicidal ideas, in the past he tried SSRIs but didn't like it much. Mild depression reported. Denies chest pain or shortness or breath No nausea, vomiting, diarrhea. No dysuria gross nocturia. GERD symptoms well-controlled, denies dysphagia or odynophagia;  very rarely has heartburn.    Objective:   Physical Exam  General -- alert, well-developed  Neck --no thyromegaly , normal carotid pulse Lungs -- normal respiratory effort, no intercostal retractions, no accessory muscle use, and normal breath sounds.   Heart-- normal rate, regular rhythm, no murmur, and no gallop.   Abdomen--soft, non-tender, no distention, no masses, no HSM, no  guarding, and no rigidity.   Extremities-- no pretibial edema bilaterally Rectal-- No external abnormalities noted. Normal sphincter tone. No rectal masses or tenderness. Brown stool  Prostate:  Prostate gland firm and smooth, no enlargement, nodularity, tenderness, mass, asymmetry or induration. Neurologic-- alert & oriented X3 and strength normal in all extremities. Psych-- Cognition and judgment appear intact. Alert and cooperative with normal attention span and concentration.  mildly anxious appearing , not depressed appearing.       Assessment & Plan:

## 2012-06-17 NOTE — Assessment & Plan Note (Signed)
Very stressed d/t work issues, sx ok w/ xanax prn, we discussed other options included SSRIs, counseling, meditation, exercise. Pt not ready for SSRIs but will call if needed

## 2012-06-17 NOTE — Assessment & Plan Note (Addendum)
Td  2007  zostavax-- discussed   last colonoscopy 10/2009, polyp, next 2016 counseled diet, exercise   Labs

## 2012-06-18 ENCOUNTER — Ambulatory Visit: Payer: BC Managed Care – PPO

## 2012-06-18 DIAGNOSIS — R7309 Other abnormal glucose: Secondary | ICD-10-CM

## 2012-06-18 LAB — HEMOGLOBIN A1C: Hgb A1c MFr Bld: 5.3 % (ref 4.6–6.5)

## 2012-06-19 ENCOUNTER — Encounter: Payer: Self-pay | Admitting: Internal Medicine

## 2012-07-08 ENCOUNTER — Encounter: Payer: Self-pay | Admitting: Internal Medicine

## 2012-07-17 ENCOUNTER — Other Ambulatory Visit: Payer: Self-pay | Admitting: Gastroenterology

## 2012-08-17 ENCOUNTER — Other Ambulatory Visit: Payer: Self-pay | Admitting: Gastroenterology

## 2012-08-18 ENCOUNTER — Telehealth: Payer: Self-pay | Admitting: Internal Medicine

## 2012-08-18 NOTE — Telephone Encounter (Signed)
PLEASE MAKE AN OFFICE VISIT FOR FURTHER REFILLS  

## 2012-08-19 NOTE — Telephone Encounter (Signed)
Ok to refill?  Last OV 3.18.14 Last filled 1.5.14

## 2012-08-19 NOTE — Telephone Encounter (Signed)
done

## 2012-08-19 NOTE — Telephone Encounter (Signed)
Faxed rx

## 2012-09-24 ENCOUNTER — Encounter: Payer: Self-pay | Admitting: Gastroenterology

## 2012-09-27 ENCOUNTER — Other Ambulatory Visit: Payer: Self-pay | Admitting: Gastroenterology

## 2012-09-30 ENCOUNTER — Telehealth: Payer: Self-pay | Admitting: Gastroenterology

## 2012-09-30 MED ORDER — DEXLANSOPRAZOLE 60 MG PO CPDR: 60.0000 mg | DELAYED_RELEASE_CAPSULE | Freq: Every day | ORAL | Status: DC

## 2012-09-30 MED ORDER — METOCLOPRAMIDE HCL 10 MG PO TABS: 10.0000 mg | ORAL_TABLET | Freq: Every day | ORAL | Status: DC

## 2012-09-30 NOTE — Telephone Encounter (Signed)
Rx sent 

## 2012-10-14 ENCOUNTER — Encounter: Payer: Self-pay | Admitting: Gastroenterology

## 2012-10-14 ENCOUNTER — Ambulatory Visit (INDEPENDENT_AMBULATORY_CARE_PROVIDER_SITE_OTHER): Payer: BC Managed Care – PPO | Admitting: Gastroenterology

## 2012-10-14 VITALS — BP 132/84 | HR 57 | Ht 69.5 in | Wt 169.0 lb

## 2012-10-14 DIAGNOSIS — K219 Gastro-esophageal reflux disease without esophagitis: Secondary | ICD-10-CM

## 2012-10-14 DIAGNOSIS — K2270 Barrett's esophagus without dysplasia: Secondary | ICD-10-CM

## 2012-10-14 DIAGNOSIS — Z9049 Acquired absence of other specified parts of digestive tract: Secondary | ICD-10-CM

## 2012-10-14 DIAGNOSIS — Z9889 Other specified postprocedural states: Secondary | ICD-10-CM

## 2012-10-14 MED ORDER — METOCLOPRAMIDE HCL 10 MG PO TABS: 10.0000 mg | ORAL_TABLET | Freq: Every day | ORAL | Status: DC

## 2012-10-14 MED ORDER — DEXLANSOPRAZOLE 60 MG PO CPDR: 60.0000 mg | DELAYED_RELEASE_CAPSULE | Freq: Every day | ORAL | Status: DC

## 2012-10-14 NOTE — Progress Notes (Signed)
History of Present Illness: This is a pleasant 57 year old Caucasian male with chronic GERD and biopsy proven Barrett's mucosa in his esophagus.  He is somewhat difficult to treat acid reflux and currently is on Dexilant 60 mg every morning with Reglan 10 mg at bedtime, and Zantac on and 50 mg also at bedtime.  Has a history of chronic anxiety disorder treated with when necessary Xanax.Ryan Smith  He'll occasional have some nocturnal acid reflux, but is generally doing extremely well and denies any dysphagia or hepatobiliary complaints.  He is status post sigmoid resection for diverticulitis, and is on daily Metamucil and is having regular bowel movements.  He is due for followup endoscopy in August of this year.    Current Medications, Allergies, Past Medical History, Past Surgical History, Family History and Social History were reviewed in [employer] record.  ROS: All systems were reviewed and are negative unless otherwise stated in the HPI.         Assessment and plan: chronic GERD doing well on current regime.  I have renewed all his medications and we have scheduled his endoscopy as per clinical protocol. Encounter Diagnosis  Name Primary?  . Barrett's esophagus Yes

## 2012-10-14 NOTE — Patient Instructions (Signed)
  You have been scheduled for an endoscopy with propofol. Please follow written instructions given to you at your visit today. If you use inhalers (even only as needed), please bring them with you on the day of your procedure. Your physician has requested that you go to www.startemmi.com and enter the access code given to you at your visit today. This web site gives a general overview about your procedure. However, you should still follow specific instructions given to you by our office regarding your preparation for the procedure.  New prescription for your Dexilant and Reglan were sent to your pharmacy. ________________________________________________________________________________________________                                               We are excited to introduce MyChart, a new best-in-class service that provides you online access to important information in your electronic medical record. We want to make it easier for you to view your health information - all in one secure location - when and where you need it. We expect MyChart will enhance the quality of care and service we provide.  When you register for MyChart, you can:    View your test results.    Request appointments and receive appointment reminders via email.    Request medication renewals.    View your medical history, allergies, medications and immunizations.    Communicate with your physician's office through a password-protected site.    Conveniently print information such as your medication lists.  To find out if MyChart is right for you, please talk to a member of our clinical staff today. We will gladly answer your questions about this free health and wellness tool.  If you are age 58 or older and want a member of your family to have access to your record, you must provide written consent by completing a proxy form available at our office. Please speak to our clinical staff about guidelines regarding accounts  for patients younger than age 22.  As you activate your MyChart account and need any technical assistance, please call the MyChart technical support line at [phone]) or email your question to [email or web address]. If you email your question(s), please include your name, a return phone number and the best time to reach you.  If you have non-urgent health-related questions, you can send a message to our office through MyChart at [email or web address]. If you have a medical emergency, call 911.  Thank you for using MyChart as your new health and wellness resource!   MyChart licensed from [employer],  [id number]. Patents Pending.

## 2012-10-15 ENCOUNTER — Encounter: Payer: Self-pay | Admitting: Gastroenterology

## 2012-10-23 ENCOUNTER — Other Ambulatory Visit: Payer: Self-pay | Admitting: Internal Medicine

## 2012-10-23 NOTE — Telephone Encounter (Signed)
Refill done per protocol.  

## 2012-10-25 ENCOUNTER — Other Ambulatory Visit: Payer: Self-pay | Admitting: Internal Medicine

## 2012-10-27 NOTE — Telephone Encounter (Signed)
Spoke with pharmacy, refills still left on file. Request sent in error.

## 2012-10-28 ENCOUNTER — Other Ambulatory Visit: Payer: Self-pay | Admitting: Dermatology

## 2012-11-03 ENCOUNTER — Ambulatory Visit (AMBULATORY_SURGERY_CENTER): Payer: BC Managed Care – PPO | Admitting: Gastroenterology

## 2012-11-03 ENCOUNTER — Encounter: Payer: Self-pay | Admitting: Gastroenterology

## 2012-11-03 VITALS — BP 136/79 | HR 60 | Temp 98.3°F | Resp 26 | Ht 69.5 in | Wt 169.0 lb

## 2012-11-03 DIAGNOSIS — K219 Gastro-esophageal reflux disease without esophagitis: Secondary | ICD-10-CM

## 2012-11-03 DIAGNOSIS — K209 Esophagitis, unspecified without bleeding: Secondary | ICD-10-CM

## 2012-11-03 DIAGNOSIS — K2270 Barrett's esophagus without dysplasia: Secondary | ICD-10-CM

## 2012-11-03 MED ORDER — SODIUM CHLORIDE 0.9 % IV SOLN: 500.0000 mL | INTRAVENOUS | Status: DC

## 2012-11-03 NOTE — Progress Notes (Signed)
Called to room to assist during endoscopic procedure.  Patient ID and intended procedure confirmed with present staff. Received instructions for my participation in the procedure from the performing physician.  

## 2012-11-03 NOTE — Op Note (Signed)
[facility] [address] [place], [address]   ENDOSCOPY PROCEDURE REPORT  PATIENT: Ryan Smith, Ryan Smith  MR#: [id number] BIRTHDATE: Jun 21, 1955 , 56  yrs. old GENDER: Male ENDOSCOPIST:Elizebath Wever Hale Bogus, MD, [facility] REFERRED BY: PROCEDURE DATE:  11/03/2012 PROCEDURE:   EGD w/ biopsy ASA CLASS:    Class II INDICATIONS: history of Barrett's esophagus. MEDICATION: propofol (Diprivan) 250mg  IV TOPICAL ANESTHETIC:   Cetacaine Spray  DESCRIPTION OF PROCEDURE:   After the risks and benefits of the procedure were explained, informed consent was obtained.  The LB [id number]  endoscope was introduced through the mouth  and advanced to the second portion of the duodenum .  The instrument was slowly withdrawn as the mucosa was fully examined.      DUODENUM: The duodenal mucosa showed no abnormalities in the bulb and second portion of the duodenum.  STOMACH: The mucosa of the stomach appeared normal.  ESOPHAGUS: There was evidence of Barrett's esophagus at the gastroesophageal junction.  Multiple biopsies were performed using cold forceps. There was no acute esophagitis, stricture, or large hiatal hernia. Sample sent for histology.    Retroflexed views revealed a small hiatal hernia.    The scope was then withdrawn from the patient and the procedure completed.  COMPLICATIONS: There were no complications.   ENDOSCOPIC IMPRESSION: 1.   The duodenal mucosa showed no abnormalities in the bulb and second portion of the duodenum 2.   The mucosa of the stomach appeared normal ..scattered hyperplastic polyps seen in fundus. 3.   There was evidence of Barrett's esophagus; multiple biopsies ...short segment of Barrett's mucosa .No nodular or atypical lesions noted.  RECOMMENDATIONS: 1.  Await biopsy results 2.  Continue PPI 3.  FOLLOW UP EGD FOR REPEAT BARRETT'S SURVEILLANCE IN   3  YEARS    _______________________________ eSigned:  Mardella Layman, MD, [facility]  11/03/2012 1:49 PM   standard discharge

## 2012-11-03 NOTE — Patient Instructions (Addendum)
Discharge instructions given with verbal understanding. Biopsies taken. Resume previous medications. YOU HAD AN ENDOSCOPIC PROCEDURE TODAY AT THE [facility]: Refer to the procedure report that was given to you for any specific questions about what was found during the examination.  If the procedure report does not answer your questions, please call your gastroenterologist to clarify.  If you requested that your care partner not be given the details of your procedure findings, then the procedure report has been included in a sealed envelope for you to review at your convenience later.  YOU SHOULD EXPECT: Some feelings of bloating in the abdomen. Passage of more gas than usual.  Walking can help get rid of the air that was put into your GI tract during the procedure and reduce the bloating. If you had a lower endoscopy (such as a colonoscopy or flexible sigmoidoscopy) you may notice spotting of blood in your stool or on the toilet paper. If you underwent a bowel prep for your procedure, then you may not have a normal bowel movement for a few days.  DIET: Your first meal following the procedure should be a light meal and then it is ok to progress to your normal diet.  A half-sandwich or bowl of soup is an example of a good first meal.  Heavy or fried foods are harder to digest and may make you feel nauseous or bloated.  Likewise meals heavy in dairy and vegetables can cause extra gas to form and this can also increase the bloating.  Drink plenty of fluids but you should avoid alcoholic beverages for 24 hours.  ACTIVITY: Your care partner should take you home directly after the procedure.  You should plan to take it easy, moving slowly for the rest of the day.  You can resume normal activity the day after the procedure however you should NOT DRIVE or use heavy machinery for 24 hours (because of the sedation medicines used during the test).    SYMPTOMS TO REPORT IMMEDIATELY: A gastroenterologist  can be reached at any hour.  During normal business hours, 8:30 AM to 5:00 PM Monday through Friday, call [phone].  After hours and on weekends, please call the GI answering service at [phone] who will take a message and have the physician on call contact you.   Following upper endoscopy (EGD)  Vomiting of blood or coffee ground material  New chest pain or pain under the shoulder blades  Painful or persistently difficult swallowing  New shortness of breath  Fever of 100F or higher  Black, tarry-looking stools  FOLLOW UP: If any biopsies were taken you will be contacted by phone or by letter within the next 1-3 weeks.  Call your gastroenterologist if you have not heard about the biopsies in 3 weeks.  Our staff will call the home number listed on your records the next business day following your procedure to check on you and address any questions or concerns that you may have at that time regarding the information given to you following your procedure. This is a courtesy call and so if there is no answer at the home number and we have not heard from you through the emergency physician on call, we will assume that you have returned to your regular daily activities without incident.  SIGNATURES/CONFIDENTIALITY: You and/or your care partner have signed paperwork which will be entered into your electronic medical record.  These signatures attest to the fact that that the information above on your After   Visit Summary has been reviewed and is understood.  Full responsibility of the confidentiality of this discharge information lies with you and/or your care-partner. 

## 2012-11-03 NOTE — Progress Notes (Signed)
Patient did not experience any of the following events: a burn prior to discharge; a fall within the facility; wrong site/side/patient/procedure/implant event; or a hospital transfer or hospital admission upon discharge from the facility. (G8907) Patient did not have preoperative order for IV antibiotic SSI prophylaxis. (G8918)  

## 2012-11-04 ENCOUNTER — Telehealth: Payer: Self-pay

## 2012-11-04 NOTE — Telephone Encounter (Signed)
No answer, left message to call [facility] if problems post procedure

## 2012-11-07 ENCOUNTER — Encounter: Payer: Self-pay | Admitting: Gastroenterology

## 2012-12-18 ENCOUNTER — Ambulatory Visit: Payer: BC Managed Care – PPO | Admitting: Internal Medicine

## 2012-12-20 ENCOUNTER — Other Ambulatory Visit: Payer: Self-pay | Admitting: Gastroenterology

## 2012-12-23 ENCOUNTER — Other Ambulatory Visit: Payer: Self-pay | Admitting: Internal Medicine

## 2012-12-24 NOTE — Telephone Encounter (Signed)
rx refill- Xanax Last OV- 06/17/12 Future OV- 12/31/12 Last refilled- 08/18/12 #90 / 1 rf  UDS Low risk- 06/17/12  Please advise. DJR

## 2012-12-31 ENCOUNTER — Ambulatory Visit (INDEPENDENT_AMBULATORY_CARE_PROVIDER_SITE_OTHER): Payer: BC Managed Care – PPO | Admitting: Internal Medicine

## 2012-12-31 ENCOUNTER — Encounter: Payer: Self-pay | Admitting: Internal Medicine

## 2012-12-31 VITALS — BP 151/86 | HR 63 | Temp 98.1°F | Wt 172.0 lb

## 2012-12-31 DIAGNOSIS — G4700 Insomnia, unspecified: Secondary | ICD-10-CM

## 2012-12-31 DIAGNOSIS — F411 Generalized anxiety disorder: Secondary | ICD-10-CM

## 2012-12-31 MED ORDER — ALPRAZOLAM 0.5 MG PO TABS: 0.5000 mg | ORAL_TABLET | Freq: Two times a day (BID) | ORAL | Status: DC | PRN

## 2012-12-31 NOTE — Assessment & Plan Note (Signed)
Well-controlled with Xanax at night, last UDS low risk, plan: refill meds and UDS on return to the office

## 2012-12-31 NOTE — Progress Notes (Signed)
  Subjective:    Patient ID: Ryan Smith, male    DOB: 04-10-1955, 57 y.o.   MRN: [id number]  HPI Routine visit History of anxiety and   insomnia, still has stress at work , on and off, symptoms are well managed by taking Xanax at nighttime occasionally a dose during the  daytime. History of GERD, symptoms well-controlled most days. From time to time has a visual disturbance, about 3 times a year, what he describes is an area in his visual field gets blurred.  Past Medical History  Diagnosis Date  . Personal history of colonic polyps     tublar adenomas 2008 & hyerplastic 2011  . Anxiety   . History of diverticulitis of colon   . Esophageal reflux   . Barrett's esophagus   . Hearing loss   . Abnormal LFTs     2008 neg. hep. serology, (-) ANA, ceruloplasmin, etc.;  liver biopsy showed  minimal active inflammation  . Hiatal hernia    Past Surgical History  Procedure Laterality Date  . Liver biopsy      minimal active inflammation  . Laparoscopically-assisted sigmoid colectomy  11/10/2007   History   Social History  . Marital Status: Married    Spouse Name: N/A    Number of Children: 2  . Years of Education: N/A   Occupational History  . Occupational hygienist, [employer]    Social History Main Topics  . Smoking status: Current Every Day Smoker    Types: Cigarettes  . Smokeless tobacco: Never Used     Comment: 3 cigarettes daily  . Alcohol Use: 0.0 oz/week     Comment: 3- 6 ounces wine glasses daily  . Drug Use: No  . Sexual Activity: Not on file   Other Topics Concern  . Not on file   Social History Narrative   Born in [place]   Married, 2 step sons     Review of Systems Denies depression per se. Denies headache, diplopia, slurred speech, facial paresthesias.     Objective:   Physical Exam  BP 151/86  Pulse 63  Temp(Src) 98.1 F (36.7 C)  Wt 172 lb (78.019 kg)  BMI 25.04 kg/m2  SpO2 94%  General -- alert, well-developed, NAD.  Lungs -- normal  respiratory effort, no intercostal retractions, no accessory muscle use, and normal breath sounds.  Heart-- normal rate, regular rhythm, no murmur.   Neurologic--  alert & oriented X3. Speech normal, gait normal, strength normal in all extremities.  EOMI, PERLA   Psych-- Cognition and judgment appear intact. Cooperative with normal attention span and concentration. No anxious appearing , no depressed appearing.      Assessment & Plan:   Visual disturbances: No symptoms consistent with stroke or migraines, eye exam normal, recommend to see ophthalmology first and come back here if needed.

## 2012-12-31 NOTE — Assessment & Plan Note (Signed)
Controlled with occasional use of AM Xanax

## 2013-02-05 ENCOUNTER — Other Ambulatory Visit: Payer: Self-pay

## 2013-03-01 ENCOUNTER — Other Ambulatory Visit: Payer: Self-pay | Admitting: Gastroenterology

## 2013-04-29 ENCOUNTER — Other Ambulatory Visit: Payer: Self-pay | Admitting: Gastroenterology

## 2013-04-29 ENCOUNTER — Other Ambulatory Visit: Payer: Self-pay | Admitting: Internal Medicine

## 2013-04-30 ENCOUNTER — Other Ambulatory Visit: Payer: Self-pay | Admitting: Dermatology

## 2013-06-03 ENCOUNTER — Other Ambulatory Visit: Payer: Self-pay | Admitting: Dermatology

## 2013-06-28 ENCOUNTER — Other Ambulatory Visit: Payer: Self-pay | Admitting: Gastroenterology

## 2013-06-30 ENCOUNTER — Telehealth: Payer: Self-pay | Admitting: Gastroenterology

## 2013-06-30 ENCOUNTER — Telehealth: Payer: Self-pay

## 2013-06-30 MED ORDER — METOCLOPRAMIDE HCL 10 MG PO TABS: ORAL_TABLET | ORAL | Status: DC

## 2013-06-30 MED ORDER — DEXLANSOPRAZOLE 60 MG PO CPDR: 60.0000 mg | DELAYED_RELEASE_CAPSULE | Freq: Every day | ORAL | Status: DC

## 2013-06-30 NOTE — Telephone Encounter (Signed)
/  Per Alonza Bogus, PA patient can have refills until appt scheduled with Dr. Fuller Plan on 07/27/13.

## 2013-06-30 NOTE — Telephone Encounter (Signed)
Medication List and allergies:  Reviewed and updated  90 day supply/mail order: na Local prescriptions: [facility] [address]   Immunizations due: UTD  A/P:   No changes to FH, PSH or Personal Hx Flu vaccine--12/2012 Tdap--2007 CCS--12/2009--Dr Patterson--benign polyps--next 2016 PSA--05/2012--2.73  To Discuss with Provider: Not at this time

## 2013-07-01 ENCOUNTER — Encounter: Payer: Self-pay | Admitting: Internal Medicine

## 2013-07-01 ENCOUNTER — Telehealth: Payer: Self-pay | Admitting: Internal Medicine

## 2013-07-01 ENCOUNTER — Ambulatory Visit (INDEPENDENT_AMBULATORY_CARE_PROVIDER_SITE_OTHER): Payer: BC Managed Care – PPO | Admitting: Internal Medicine

## 2013-07-01 VITALS — BP 145/91 | HR 80 | Temp 98.3°F | Ht 70.0 in | Wt 172.0 lb

## 2013-07-01 DIAGNOSIS — R945 Abnormal results of liver function studies: Secondary | ICD-10-CM

## 2013-07-01 DIAGNOSIS — F411 Generalized anxiety disorder: Secondary | ICD-10-CM

## 2013-07-01 DIAGNOSIS — R7989 Other specified abnormal findings of blood chemistry: Secondary | ICD-10-CM

## 2013-07-01 DIAGNOSIS — Z0000 Encounter for general adult medical examination without abnormal findings: Secondary | ICD-10-CM

## 2013-07-01 LAB — CBC WITH DIFFERENTIAL/PLATELET
Basophils Absolute: 0 K/uL (ref 0.0–0.1)
Basophils Relative: 0.5 % (ref 0.0–3.0)
Eosinophils Absolute: 0.1 K/uL (ref 0.0–0.7)
Eosinophils Relative: 2.2 % (ref 0.0–5.0)
HCT: 46.3 % (ref 39.0–52.0)
Hemoglobin: 15.8 g/dL (ref 13.0–17.0)
Lymphocytes Relative: 30.4 % (ref 12.0–46.0)
Lymphs Abs: 1.8 K/uL (ref 0.7–4.0)
MCHC: 34.1 g/dL (ref 30.0–36.0)
MCV: 91.5 fl (ref 78.0–100.0)
Monocytes Absolute: 0.6 K/uL (ref 0.1–1.0)
Monocytes Relative: 9.5 % (ref 3.0–12.0)
Neutro Abs: 3.3 K/uL (ref 1.4–7.7)
Neutrophils Relative %: 57.4 % (ref 43.0–77.0)
Platelets: 330 K/uL (ref 150.0–400.0)
RBC: 5.06 Mil/uL (ref 4.22–5.81)
RDW: 13.6 % (ref 11.5–14.6)
WBC: 5.8 K/uL (ref 4.5–10.5)

## 2013-07-01 LAB — LIPID PANEL
Cholesterol: 217 mg/dL — ABNORMAL HIGH (ref 0–200)
HDL: 49.3 mg/dL (ref >39.00)
LDL Cholesterol: 141 mg/dL — ABNORMAL HIGH (ref 0–99)
Total CHOL/HDL Ratio: 4
Triglycerides: 133 mg/dL (ref 0.0–149.0)
VLDL: 26.6 mg/dL (ref 0.0–40.0)

## 2013-07-01 LAB — COMPREHENSIVE METABOLIC PANEL WITH GFR
ALT: 66 U/L — ABNORMAL HIGH (ref 0–53)
AST: 37 U/L (ref 0–37)
Albumin: 4.4 g/dL (ref 3.5–5.2)
Alkaline Phosphatase: 95 U/L (ref 39–117)
BUN: 13 mg/dL (ref 6–23)
CO2: 27 meq/L (ref 19–32)
Calcium: 9.5 mg/dL (ref 8.4–10.5)
Chloride: 102 meq/L (ref 96–112)
Creatinine, Ser: 1 mg/dL (ref 0.4–1.5)
GFR: 77.83 mL/min
Glucose, Bld: 100 mg/dL — ABNORMAL HIGH (ref 70–99)
Potassium: 4.4 meq/L (ref 3.5–5.1)
Sodium: 138 meq/L (ref 135–145)
Total Bilirubin: 0.9 mg/dL (ref 0.3–1.2)
Total Protein: 7 g/dL (ref 6.0–8.3)

## 2013-07-01 LAB — PSA: PSA: 3.72 ng/mL (ref 0.10–4.00)

## 2013-07-01 LAB — TSH: TSH: 2.16 u[IU]/mL (ref 0.35–5.50)

## 2013-07-01 NOTE — Progress Notes (Signed)
Subjective:    Patient ID: Ryan Smith, male    DOB: 1955-08-13, 58 y.o.   MRN: [id number]  DOS:  07/01/2013 Type of  visit: CPX     ROS Diet-- trying to eat healthy Exercise-- walks daily No  CP, SOB No palpitations, no lower extremity edema Denies  nausea, vomiting diarrhea Denies  blood in the stools GERD  Sx well controlled  No dysphagia or odynophagia (-) cough, sputum production No dysuria, gross hematuria, difficulty urinating  No depression   Past Medical History  Diagnosis Date  . Personal history of colonic polyps     tublar adenomas 2008 & hyerplastic 2011  . Anxiety   . History of diverticulitis of colon   . Esophageal reflux   . Barrett's esophagus   . Hearing loss   . Abnormal LFTs     2008 neg. hep. serology, (-) ANA, ceruloplasmin, etc.;  liver biopsy showed  minimal active inflammation  . Hiatal hernia     Past Surgical History  Procedure Laterality Date  . Liver biopsy      minimal active inflammation  . Laparoscopically-assisted sigmoid colectomy  11/10/2007    History   Social History  . Marital Status: Married    Spouse Name: N/A    Number of Children: 2  . Years of Education: N/A   Occupational History  . Therapist, music, [employer]    Social History Main Topics  . Smoking status: Current Every Day Smoker    Types: Cigarettes  . Smokeless tobacco: Never Used     Comment: 3 cigarettes daily  . Alcohol Use: 0.0 oz/week     Comment: 2 glasses wine glasses daily  . Drug Use: No  . Sexual Activity: Not on file   Other Topics Concern  . Not on file   Social History Narrative   Born in [place]   Married, 2 step sons     Family History  Problem Relation Age of Onset  . Prostate cancer Father 8  . Esophageal cancer Father   . Coronary artery disease Father   . Colon cancer Neg Hx   . Heart disease Paternal Uncle     x 2  . Diabetes         Medication List       This list is accurate as of: 07/01/13 12:53  PM.  Always use your most recent med list.               ALPRAZolam 0.5 MG tablet  Commonly known as:  XANAX  Take 1 tablet (0.5 mg total) by mouth 2 (two) times daily as needed for sleep or anxiety.     Coenzyme Q10 10 MG capsule  Take 10 mg by mouth daily.     dexlansoprazole 60 MG capsule  Commonly known as:  DEXILANT  Take 1 capsule (60 mg total) by mouth daily.     Magnesium 500 MG Caps  Take 1 capsule by mouth at bedtime.     metoCLOPramide 10 MG tablet  Commonly known as:  REGLAN  TAKE 1 TABLET (10 MG TOTAL) BY MOUTH AT BEDTIME.     psyllium 58.6 % powder  Commonly known as:  METAMUCIL  Take 1 packet by mouth daily.     ranitidine 150 MG tablet  Commonly known as:  ZANTAC  TAKE 1 TABLET (150 MG TOTAL) BY MOUTH AT BEDTIME.     RED YEAST RICE PO  Take 2 capsules by mouth at  bedtime.           Objective:   Physical Exam BP 145/91  Pulse 80  Temp(Src) 98.3 F (36.8 C)  Ht 5\' 10"  (1.778 m)  Wt 172 lb (78.019 kg)  BMI 24.68 kg/m2  SpO2 100%  General -- alert, well-developed, NAD.  Neck --no thyromegaly , normal carotid pulse  HEENT-- Not pale.  Lungs -- normal respiratory effort, no intercostal retractions, no accessory muscle use, and normal breath sounds.  Heart-- normal rate, regular rhythm, no murmur.  Abdomen-- Not distended, good bowel sounds,soft, non-tender. Rectal-- + external hemorrhoids noted. Normal sphincter tone. No rectal masses or tenderness. Brown stool  Prostate--Prostate gland firm and smooth, no enlargement, nodularity, tenderness, mass, asymmetry or induration. Extremities-- no pretibial edema bilaterally  Neurologic--  alert & oriented X3. Speech normal, gait normal, strength normal in all extremities.  Psych-- Cognition and judgment appear intact. Cooperative with normal attention span and concentration. No anxious or depressed appearing.       Assessment & Plan:

## 2013-07-01 NOTE — Patient Instructions (Signed)
Get your blood work before you leave Also need a UDS   Next visit is for a physical exam in 1 year,  fasting Please make an appointment     If you need more information about quitting tobacco visit  the American Heart Association, it  is a Financial planner at:  [email or web address]

## 2013-07-01 NOTE — Telephone Encounter (Signed)
Relevant patient education assigned to patient using Emmi. ° °

## 2013-07-01 NOTE — Assessment & Plan Note (Signed)
Recheck LFTs Recommend   not to drink more than 2 glasses of wine at night

## 2013-07-01 NOTE — Assessment & Plan Note (Signed)
Well-controlled, check UDS

## 2013-07-01 NOTE — Assessment & Plan Note (Addendum)
Td  2007  zostavax-- discussed before , decided to wait  last colonoscopy 10/2009, polyp, next 2016 counseled diet, exercise   Labs Counseled about tobacco

## 2013-07-01 NOTE — Progress Notes (Signed)
Pre visit review using our clinic review tool, if applicable. No additional management support is needed unless otherwise documented below in the visit note. 

## 2013-07-07 ENCOUNTER — Telehealth: Payer: Self-pay | Admitting: *Deleted

## 2013-07-07 NOTE — Telephone Encounter (Signed)
Pt to call back for lab visit.

## 2013-07-07 NOTE — Telephone Encounter (Signed)
Ok , please arrange labs in 6 months

## 2013-07-07 NOTE — Telephone Encounter (Signed)
Message copied by Peggyann Shoals on Tue Jul 07, 2013 11:07 AM ------      Message from: Kathlene November E      Created: Fri Jul 03, 2013  8:50 PM       Advise patient, results are normal except for cholesterol which is elevated, LDL is 141, previously, his goal was set at ~100, options:      Work on diet and exercise        Work on diet and exercise and start a very low dose of Crestor, 5 mg daily.      Labs in 6 months      Let me know what the the patient prefers ------

## 2013-07-07 NOTE — Telephone Encounter (Signed)
Pt verbalized understanding. Pt not interested in taking the medication will work on exercise and diet.

## 2013-07-16 ENCOUNTER — Other Ambulatory Visit: Payer: Self-pay | Admitting: Internal Medicine

## 2013-07-17 ENCOUNTER — Telehealth: Payer: Self-pay | Admitting: *Deleted

## 2013-07-17 MED ORDER — ALPRAZOLAM 0.5 MG PO TABS: 0.5000 mg | ORAL_TABLET | Freq: Two times a day (BID) | ORAL | Status: DC | PRN

## 2013-07-17 NOTE — Telephone Encounter (Signed)
rx faxed to [facility]

## 2013-07-17 NOTE — Telephone Encounter (Signed)
Xanax 0.5mg  Last OV- 07/01/13 Last refilled- 12/31/12 #60 / 4 rf UDS- 06/17/12 LOW risk

## 2013-07-17 NOTE — Telephone Encounter (Signed)
done

## 2013-07-24 ENCOUNTER — Telehealth: Payer: Self-pay

## 2013-07-24 NOTE — Telephone Encounter (Signed)
UDS: 07/02/2013 Positive for Ambien (not on drug list) Negative for alprazolam (gets regularly) High Risk per [name]

## 2013-07-27 ENCOUNTER — Ambulatory Visit (INDEPENDENT_AMBULATORY_CARE_PROVIDER_SITE_OTHER): Payer: BC Managed Care – PPO | Admitting: Gastroenterology

## 2013-07-27 ENCOUNTER — Encounter: Payer: Self-pay | Admitting: Gastroenterology

## 2013-07-27 VITALS — BP 134/78 | HR 72 | Ht 69.5 in | Wt 172.4 lb

## 2013-07-27 DIAGNOSIS — Z86010 Personal history of colonic polyps: Secondary | ICD-10-CM

## 2013-07-27 DIAGNOSIS — K2270 Barrett's esophagus without dysplasia: Secondary | ICD-10-CM

## 2013-07-27 MED ORDER — DEXLANSOPRAZOLE 60 MG PO CPDR: 60.0000 mg | DELAYED_RELEASE_CAPSULE | Freq: Every day | ORAL | Status: DC

## 2013-07-27 NOTE — Patient Instructions (Addendum)
You have been given a separate informational sheet regarding your tobacco use, the importance of quitting and local resources to help you quit.  We have sent the following medications to your pharmacy for you to pick up at your convenience:  You will be due for a recall colonoscopy in 11/2014. We will send you a reminder in the mail when it gets closer to that time.  Thank you for choosing me and [facility].  Pricilla Riffle. Dagoberto Ligas., MD., Marval Regal

## 2013-07-27 NOTE — Progress Notes (Signed)
    History of Present Illness: This is a 58 year old male with Barrett's esophagus and GERD. His reflux symptoms have been difficult to control on several regimens but are under good control on dexlansoprazole 60 mg every morning ranitidine 150 mg at bedtime and metoclopramide 10 mg at bedtime. His symptoms have not been well controlled on esomeprazole 40 mg twice daily in the past.  Current Medications, Allergies, Past Medical History, Past Surgical History, Family History and Social History were reviewed in [employer] record.  Physical Exam: General: Well developed , well nourished, no acute distress Head: Normocephalic and atraumatic Eyes:  sclerae anicteric, EOMI Ears: Normal auditory acuity Mouth: No deformity or lesions Lungs: Clear throughout to auscultation Heart: Regular rate and rhythm; no murmurs, rubs or bruits Abdomen: Soft, non tender and non distended. No masses, hepatosplenomegaly or hernias noted. Normal Bowel sounds Musculoskeletal: Symmetrical with no gross deformities  Pulses:  Normal pulses noted Extremities: No clubbing, cyanosis, edema or deformities noted Neurological: Alert oriented x 4, grossly nonfocal Psychological:  Alert and cooperative. Normal mood and affect  Assessment and Recommendations:  1. Barretts esophagus, GERD. Continue dexlansoprazole 60 mg every morning and ranitidine 150 mg at bedtime. Discontinue metoclopramide. If his reflux symptoms worsen will increase ranitidine to 300 mg at bedtime. Surveillance endoscopy recommended August 2017.  2. Personal history of adenomatous colon polyps. Five-year surveillance colonoscopy recommended August 2016.

## 2013-07-29 ENCOUNTER — Other Ambulatory Visit: Payer: Self-pay | Admitting: Gastroenterology

## 2013-08-17 ENCOUNTER — Encounter: Payer: Self-pay | Admitting: Internal Medicine

## 2013-08-17 ENCOUNTER — Other Ambulatory Visit: Payer: Self-pay | Admitting: Gastroenterology

## 2013-09-25 ENCOUNTER — Other Ambulatory Visit: Payer: Self-pay | Admitting: Gastroenterology

## 2013-09-28 NOTE — Telephone Encounter (Signed)
Last note from Dr. Fuller Plan in April says that the patient was supposed to discontinue the Reglan.  Please make patient aware and get an update first before we renew this prescription.  Thank you,  Jess

## 2013-10-31 ENCOUNTER — Other Ambulatory Visit: Payer: Self-pay | Admitting: Internal Medicine

## 2013-11-03 ENCOUNTER — Other Ambulatory Visit: Payer: Self-pay | Admitting: Dermatology

## 2013-11-18 ENCOUNTER — Encounter: Payer: Self-pay | Admitting: Internal Medicine

## 2013-11-18 ENCOUNTER — Ambulatory Visit (INDEPENDENT_AMBULATORY_CARE_PROVIDER_SITE_OTHER): Payer: BC Managed Care – PPO | Admitting: Internal Medicine

## 2013-11-18 VITALS — BP 158/80 | HR 70 | Temp 97.9°F | Wt 168.0 lb

## 2013-11-18 DIAGNOSIS — E785 Hyperlipidemia, unspecified: Secondary | ICD-10-CM

## 2013-11-18 DIAGNOSIS — I739 Peripheral vascular disease, unspecified: Secondary | ICD-10-CM | POA: Insufficient documentation

## 2013-11-18 HISTORY — DX: Peripheral vascular disease, unspecified: I73.9

## 2013-11-18 NOTE — Patient Instructions (Signed)
Before you leave  the office please schedule labs to be done 2 months from today: FLP---- dx  hyperlipidemia  Will call you about the test for circulation

## 2013-11-18 NOTE — Assessment & Plan Note (Signed)
Hyperlipidemia, The patient has a family history of heart disease . Recent FLP elevated, recommended crestor, he declined. Risk of CAD discussed, we agreed he will work on diet, recheck labs in 2 months and reconsider mdes

## 2013-11-18 NOTE — Progress Notes (Signed)
Subjective:    Patient ID: Ryan Smith, male    DOB: 10-08-1955, 58 y.o.   MRN: [id number]  DOS:  11/18/2013 Type of visit - description: Acute visit History: For the last few weeks has R calf pain that starts approximately after 100-150 steps, the pain goes away a few minutes after she stopped walking. Also, recent cholesterol was elevated, I recommended Crestor the patient decided against it.   ROS  Denies lower extremity edema. No back pain. No leg injury or knee pain per se. No recent prolonged car trip or airplane trip Denies nausea-vomiting diarrhea    Past Medical History  Diagnosis Date  . Personal history of colonic polyps     tublar adenomas 2008 & hyerplastic 2011  . Anxiety   . History of diverticulitis of colon   . Esophageal reflux   . Barrett's esophagus   . Hearing loss   . Abnormal LFTs     2008 neg. hep. serology, (-) ANA, ceruloplasmin, etc.;  liver biopsy showed  minimal active inflammation  . Hiatal hernia     Past Surgical History  Procedure Laterality Date  . Liver biopsy      minimal active inflammation  . Laparoscopically-assisted sigmoid colectomy  11/10/2007    History   Social History  . Marital Status: Married    Spouse Name: N/A    Number of Children: 2  . Years of Education: N/A   Occupational History  . Therapist, music, [employer]    Social History Main Topics  . Smoking status: Current Every Day Smoker    Types: Cigarettes  . Smokeless tobacco: Never Used     Comment: 3 cigarettes daily  . Alcohol Use: 0.0 oz/week     Comment: 2 glasses wine glasses daily  . Drug Use: No  . Sexual Activity: Not on file   Other Topics Concern  . Not on file   Social History Narrative   Born in [place]   Married, 2 step sons        Medication List       This list is accurate as of: 11/18/13  1:51 PM.  Always use your most recent med list.               ALPRAZolam 0.5 MG tablet  Commonly known as:  XANAX  Take 1  tablet (0.5 mg total) by mouth 2 (two) times daily as needed for sleep or anxiety.     Coenzyme Q10 10 MG capsule  Take 10 mg by mouth daily.     DEXILANT 60 MG capsule  Generic drug:  dexlansoprazole  TAKE ONE CAPSULE BY MOUTH DAILY     dexlansoprazole 60 MG capsule  Commonly known as:  DEXILANT  Take 1 capsule (60 mg total) by mouth daily.     Magnesium 500 MG Caps  Take 1 capsule by mouth at bedtime.     metoCLOPramide 10 MG tablet  Commonly known as:  REGLAN  TAKE 1 TABLET (10 MG TOTAL) BY MOUTH AT BEDTIME.     psyllium 58.6 % powder  Commonly known as:  METAMUCIL  Take 1 packet by mouth daily.     ranitidine 150 MG tablet  Commonly known as:  ZANTAC  TAKE 1 TABLET (150 MG TOTAL) BY MOUTH AT BEDTIME.     RED YEAST RICE PO  Take 2 capsules by mouth at bedtime.           Objective:   Physical Exam BP  158/80  Pulse 70  Temp(Src) 97.9 F (36.6 C) (Oral)  Wt 168 lb (76.204 kg)  SpO2 100% General -- alert, well-developed, NAD.   Abdomen-- Not distended, good bowel sounds,soft, non-tender. No bruit  Extremities-- no pretibial edema bilaterally  Normal femoral pulses B w/o a bruit Pedal pulses present, decreased on the R? Good cap refill all toes Neurologic--  alert & oriented X3.  Speech normal, gait appropriate for age, strength symmetric and appropriate for age.    Psych-- Cognition and judgment appear intact. Cooperative with normal attention span and concentration. No anxious or depressed appearing.        Assessment & Plan:

## 2013-11-18 NOTE — Progress Notes (Signed)
Pre-visit discussion using our clinic review tool. No additional management support is needed unless otherwise documented below in the visit note.  

## 2013-11-18 NOTE — Assessment & Plan Note (Addendum)
Claudication, new problem 58 year old gentleman,  smoker presents with symptoms suggestive of claudication. Plan: ABIs, further advice would results

## 2013-11-20 ENCOUNTER — Ambulatory Visit (HOSPITAL_COMMUNITY): Payer: BC Managed Care – PPO | Attending: Internal Medicine | Admitting: Cardiology

## 2013-11-20 ENCOUNTER — Other Ambulatory Visit (HOSPITAL_COMMUNITY): Payer: Self-pay | Admitting: Cardiology

## 2013-11-20 DIAGNOSIS — I70219 Atherosclerosis of native arteries of extremities with intermittent claudication, unspecified extremity: Secondary | ICD-10-CM | POA: Insufficient documentation

## 2013-11-20 DIAGNOSIS — I739 Peripheral vascular disease, unspecified: Secondary | ICD-10-CM

## 2013-11-20 NOTE — Progress Notes (Signed)
LEA Doppler + LEA Duplex performed.

## 2013-11-24 ENCOUNTER — Encounter: Payer: Self-pay | Admitting: Cardiovascular Disease

## 2013-11-24 ENCOUNTER — Ambulatory Visit (INDEPENDENT_AMBULATORY_CARE_PROVIDER_SITE_OTHER): Payer: BC Managed Care – PPO | Admitting: Cardiovascular Disease

## 2013-11-24 VITALS — BP 150/90 | HR 78 | Ht 70.0 in | Wt 168.0 lb

## 2013-11-24 DIAGNOSIS — Z720 Tobacco use: Secondary | ICD-10-CM

## 2013-11-24 DIAGNOSIS — I739 Peripheral vascular disease, unspecified: Secondary | ICD-10-CM

## 2013-11-24 DIAGNOSIS — F17200 Nicotine dependence, unspecified, uncomplicated: Secondary | ICD-10-CM

## 2013-11-24 DIAGNOSIS — E785 Hyperlipidemia, unspecified: Secondary | ICD-10-CM

## 2013-11-24 HISTORY — DX: Tobacco use: Z72.0

## 2013-11-24 LAB — PROTIME-INR
INR: 0.9 ratio (ref 0.8–1.0)
Prothrombin Time: 10.2 s (ref 9.6–13.1)

## 2013-11-24 LAB — CBC
HCT: 47.4 % (ref 39.0–52.0)
Hemoglobin: 15.9 g/dL (ref 13.0–17.0)
MCHC: 33.6 g/dL (ref 30.0–36.0)
MCV: 93.1 fl (ref 78.0–100.0)
Platelets: 337 10*3/uL (ref 150.0–400.0)
RBC: 5.09 Mil/uL (ref 4.22–5.81)
RDW: 13.7 % (ref 11.5–15.5)
WBC: 6 10*3/uL (ref 4.0–10.5)

## 2013-11-24 LAB — BASIC METABOLIC PANEL
BUN: 14 mg/dL (ref 6–23)
CO2: 30 mEq/L (ref 19–32)
Calcium: 9.7 mg/dL (ref 8.4–10.5)
Chloride: 103 mEq/L (ref 96–112)
Creatinine, Ser: 1 mg/dL (ref 0.4–1.5)
GFR: 77.73 mL/min (ref >60.00)
Glucose, Bld: 84 mg/dL (ref 70–99)
Potassium: 4.8 mEq/L (ref 3.5–5.1)
Sodium: 139 mEq/L (ref 135–145)

## 2013-11-24 NOTE — Assessment & Plan Note (Signed)
The patient had severe right calf claudication likely of subacute onset. This started about 3-4 weeks ago and has been severe. Noninvasive evaluation showed severely reduced ABI with evidence of distal SFA/popliteal artery occlusion. I recommend urgent evaluation with lower extremity angiography and possible endovascular intervention. Risks, benefits and alternatives were discussed with the patient. The procedure is scheduled for tomorrow.

## 2013-11-24 NOTE — Progress Notes (Signed)
PCP: Dr. Larose Kells.   HPI  This is a 58 year old male who was referred from new onset right leg claudication. He has no previous cardiac history. He has known history of hyperlipidemia and tobacco use. He started having right calf claudication about 3-4 weeks ago which has been severe since then. It happens after walking about 50-100 feet and results in severe cramping and numbness. It causes him to stop and rest for a few minutes before he can resume. No rest pain or ulceration. He denies chest pain or shortness of breath.  ABI/Duplex: 11/20/2013 Right : 0.48 with abrupt occlusion of distal SFA/poplieal artery with severe tibial disease.  Normal on left.   No Known Allergies   Current Outpatient Prescriptions on File Prior to Visit  Medication Sig Dispense Refill  . ALPRAZolam (XANAX) 0.5 MG tablet Take 1 tablet (0.5 mg total) by mouth 2 (two) times daily as needed for sleep or anxiety.  60 tablet  5  . Coenzyme Q10 10 MG capsule Take 10 mg by mouth daily.      Marland Kitchen dexlansoprazole (DEXILANT) 60 MG capsule Take 1 capsule (60 mg total) by mouth daily.  30 capsule  11  . Magnesium 500 MG CAPS Take 1 capsule by mouth at bedtime.        . metoCLOPramide (REGLAN) 10 MG tablet TAKE 1 TABLET (10 MG TOTAL) BY MOUTH AT BEDTIME.  30 tablet  0  . psyllium (METAMUCIL) 58.6 % powder Take 1 packet by mouth daily.        . ranitidine (ZANTAC) 150 MG tablet TAKE 1 TABLET (150 MG TOTAL) BY MOUTH AT BEDTIME.  30 tablet  6  . Red Yeast Rice Extract (RED YEAST RICE PO) Take 2 capsules by mouth at bedtime.         No current facility-administered medications on file prior to visit.     Past Medical History  Diagnosis Date  . Personal history of colonic polyps     tublar adenomas 2008 & hyerplastic 2011  . Anxiety   . History of diverticulitis of colon   . Esophageal reflux   . Barrett's esophagus   . Hearing loss   . Abnormal LFTs     2008 neg. hep. serology, (-) ANA, ceruloplasmin, etc.;  liver biopsy  showed  minimal active inflammation  . Hiatal hernia      Past Surgical History  Procedure Laterality Date  . Liver biopsy      minimal active inflammation  . Laparoscopically-assisted sigmoid colectomy  11/10/2007     Family History  Problem Relation Age of Onset  . Prostate cancer Father 61  . Esophageal cancer Father   . Coronary artery disease Father     dx age 47, CABG  . Colon cancer Neg Hx   . Heart disease Paternal Uncle     x 2  . Diabetes       History   Social History  . Marital Status: Married    Spouse Name: N/A    Number of Children: 2  . Years of Education: N/A   Occupational History  . Therapist, music, [employer]    Social History Main Topics  . Smoking status: Current Every Day Smoker    Types: Cigarettes  . Smokeless tobacco: Never Used     Comment: 3 cigarettes daily  . Alcohol Use: 0.0 oz/week     Comment: 2 glasses wine glasses daily  . Drug Use: No  . Sexual Activity: Not on  file   Other Topics Concern  . Not on file   Social History Narrative   Born in [place]   Married, 2 step sons     ROS A 10 point review of system was performed. It is negative other than that mentioned in the history of present illness.   PHYSICAL EXAM   BP 150/90  Pulse 78  Ht 5\' 10"  (1.778 m)  Wt 168 lb (76.204 kg)  BMI 24.11 kg/m2 Constitutional: He is oriented to person, place, and time. He appears well-developed and well-nourished. No distress.  HENT: No nasal discharge.  Head: Normocephalic and atraumatic.  Eyes: Pupils are equal and round.  No discharge. Neck: Normal range of motion. Neck supple. No JVD present. No thyromegaly present.  Cardiovascular: Normal rate, regular rhythm, normal heart sounds. Exam reveals no gallop and no friction rub. No murmur heard.  Pulmonary/Chest: Effort normal and breath sounds normal. No stridor. No respiratory distress. He has no wheezes. He has no rales. He exhibits no tenderness.  Abdominal: Soft.  Bowel sounds are normal. He exhibits no distension. There is no tenderness. There is no rebound and no guarding.  Musculoskeletal: Normal range of motion. He exhibits no edema and no tenderness.  Neurological: He is alert and oriented to person, place, and time. Coordination normal.  Skin: Skin is warm and dry. No rash noted. He is not diaphoretic. No erythema. No pallor.  Psychiatric: He has a normal mood and affect. His behavior is normal. Judgment and thought content normal.  Vascular: Femoral pulses are normal. Dorsalis pedis and posterior tibial: Normal in the left and not palpable on the right. The right foot is warm.     EKG: NSR   ASSESSMENT AND PLAN

## 2013-11-24 NOTE — Assessment & Plan Note (Signed)
I had a prolonged discussion with him about the association of peripheral arterial disease and tobacco use. I strongly advised smoking cessation.

## 2013-11-24 NOTE — Patient Instructions (Signed)
Your physician has requested that you have a peripheral vascular angiogram. This exam is performed at the hospital. During this exam IV contrast is used to look at arterial blood flow. Please review the information sheet given for details.  Your physician recommends that you have lab work today: BMP, CBC and PT/INR  Your physician recommends that you continue on your current medications as directed. Please refer to the Current Medication list given to you today.  

## 2013-11-24 NOTE — Assessment & Plan Note (Signed)
Given the recent peripheral arterial disease, he will require a statin.

## 2013-11-25 ENCOUNTER — Encounter (HOSPITAL_COMMUNITY)
Admission: RE | Disposition: A | Discharge status: Home / Self Care | Payer: Self-pay | Source: Ambulatory Visit | Attending: Cardiovascular Disease

## 2013-11-25 ENCOUNTER — Telehealth: Payer: Self-pay | Admitting: Cardiovascular Disease

## 2013-11-25 ENCOUNTER — Ambulatory Visit (HOSPITAL_COMMUNITY)
Admission: RE | Admit: 2013-11-25 | Discharge: 2013-11-25 | Disposition: A | Discharge status: Home / Self Care | Payer: BC Managed Care – PPO | Source: Ambulatory Visit | Attending: Cardiovascular Disease | Admitting: Cardiovascular Disease

## 2013-11-25 DIAGNOSIS — Z79899 Other long term (current) drug therapy: Secondary | ICD-10-CM | POA: Insufficient documentation

## 2013-11-25 DIAGNOSIS — H9190 Unspecified hearing loss, unspecified ear: Secondary | ICD-10-CM | POA: Insufficient documentation

## 2013-11-25 DIAGNOSIS — K449 Diaphragmatic hernia without obstruction or gangrene: Secondary | ICD-10-CM | POA: Diagnosis not present

## 2013-11-25 DIAGNOSIS — I70219 Atherosclerosis of native arteries of extremities with intermittent claudication, unspecified extremity: Secondary | ICD-10-CM | POA: Insufficient documentation

## 2013-11-25 DIAGNOSIS — F411 Generalized anxiety disorder: Secondary | ICD-10-CM | POA: Diagnosis not present

## 2013-11-25 DIAGNOSIS — E785 Hyperlipidemia, unspecified: Secondary | ICD-10-CM | POA: Insufficient documentation

## 2013-11-25 DIAGNOSIS — K219 Gastro-esophageal reflux disease without esophagitis: Secondary | ICD-10-CM | POA: Diagnosis not present

## 2013-11-25 DIAGNOSIS — F17200 Nicotine dependence, unspecified, uncomplicated: Secondary | ICD-10-CM | POA: Diagnosis not present

## 2013-11-25 DIAGNOSIS — I743 Embolism and thrombosis of arteries of the lower extremities: Secondary | ICD-10-CM | POA: Diagnosis not present

## 2013-11-25 DIAGNOSIS — I739 Peripheral vascular disease, unspecified: Secondary | ICD-10-CM

## 2013-11-25 HISTORY — PX: ABDOMINAL AORTAGRAM: SHX5454

## 2013-11-25 SURGERY — ABDOMINAL AORTAGRAM
Anesthesia: LOCAL

## 2013-11-25 MED ORDER — SODIUM CHLORIDE 0.9 % IV SOLN
INTRAVENOUS | Status: DC
  Administered 2013-11-25: 1000 mL via INTRAVENOUS

## 2013-11-25 MED ORDER — ASPIRIN 81 MG PO CHEW: 81.0000 mg | CHEWABLE_TABLET | ORAL | Status: DC

## 2013-11-25 MED ORDER — SODIUM CHLORIDE 0.9 % IJ SOLN: 3.0000 mL | INTRAMUSCULAR | Status: DC | PRN

## 2013-11-25 MED ORDER — HEPARIN (PORCINE) IN NACL 2-0.9 UNIT/ML-% IJ SOLN
INTRAMUSCULAR | Status: AC
  Filled 2013-11-25: qty 1000

## 2013-11-25 MED ORDER — SODIUM CHLORIDE 0.9 % IV SOLN: 250.0000 mL | INTRAVENOUS | Status: DC | PRN

## 2013-11-25 MED ORDER — FENTANYL CITRATE 0.05 MG/ML IJ SOLN
INTRAMUSCULAR | Status: AC
  Filled 2013-11-25: qty 2

## 2013-11-25 MED ORDER — MIDAZOLAM HCL 2 MG/2ML IJ SOLN
INTRAMUSCULAR | Status: AC
  Filled 2013-11-25: qty 2

## 2013-11-25 MED ORDER — SODIUM CHLORIDE 0.9 % IJ SOLN: 3.0000 mL | Freq: Two times a day (BID) | INTRAMUSCULAR | Status: DC

## 2013-11-25 MED ORDER — LIDOCAINE HCL (PF) 1 % IJ SOLN
INTRAMUSCULAR | Status: AC
  Filled 2013-11-25: qty 30

## 2013-11-25 MED ORDER — SODIUM CHLORIDE 0.9 % IV SOLN: INTRAVENOUS | Status: DC

## 2013-11-25 NOTE — Progress Notes (Addendum)
Order for sheath removal verified per post procedural orders. Procedure explained to patient and Leftt femoral artery access site assessed: level 0, palpable left  dorsalis pedis and Doppler right dorsalis pedis. 5 [place] Sheath removed and manual pressure applied for 20 minutes. Pre, peri, & post procedural vitals: HR  62, RR 15, O2 Sat 95% RA, BP 136/78 Pain 0. Distal pulses remained intact after sheath removal. Access site level 0 and dressed with 4X4 gauze and tegaderm. Kenney Houseman RN confirmed condition of site. Post procedural instructions discussed with return demonstration from patient.

## 2013-11-25 NOTE — Telephone Encounter (Signed)
New problem   Pt had angiogram done today and calling to see status of his surgery that is suppose to be scheduled. Please call pt.

## 2013-11-25 NOTE — Telephone Encounter (Signed)
I spoke with [name] and he said that Dr Trula Slade will be seeing the pt for vascular consult. The pt has been discharged from the hospital and he called our office to determine when he will be having surgery. I left a message on Stephanie's voicemail at [facility] to follow-up on this pt's appointment.

## 2013-11-25 NOTE — Discharge Instructions (Signed)
Angiogram, Care After ° °Refer to this sheet in the next few weeks. These instructions provide you with information on caring for yourself after your procedure. Your health care provider may also give you more specific instructions. Your treatment has been planned according to current medical practices, but problems sometimes occur. Call your health care provider if you have any problems or questions after your procedure.  °WHAT TO EXPECT AFTER THE PROCEDURE °After your procedure, it is typical to have the following sensations: °· Minor discomfort or tenderness and a small bump at the catheter insertion site. The bump should usually decrease in size and tenderness within 1 to 2 weeks. °· Any bruising will usually fade within 2 to 4 weeks. °HOME CARE INSTRUCTIONS  °· You may need to keep taking blood thinners if they were prescribed for you. Take medicines only as directed by your health care provider. °· Do not apply powder or lotion to the site. °· Do not take baths, swim, or use a hot tub until your health care provider approves. °· You may shower 24 hours after the procedure. Remove the bandage (dressing) and gently wash the site with plain soap and water. Gently pat the site dry. °· Inspect the site at least twice daily. °· Limit your activity for the first 24 hours. Do not bend, squat, or lift anything over 10 lb (9 kg) or as directed by your health care provider. °· Plan to have someone take you home after the procedure. Follow instructions about when you can drive or return to work. °SEEK MEDICAL CARE IF: °· You get light-headed when standing up. °· You have drainage (other than a small amount of blood on the dressing). °· You have chills. °· You have a fever. °· You have redness, warmth, swelling, or pain at the insertion site. °SEEK IMMEDIATE MEDICAL CARE IF:  °· You develop chest pain or shortness of breath, feel faint, or pass out. °· You have bleeding, swelling larger than a walnut, or drainage from the  catheter insertion site. °· You develop pain, discoloration, coldness, or severe bruising in the leg or arm that held the catheter. °· You have heavy bleeding from the site. If this happens, hold pressure on the site. °MAKE SURE YOU: °· Understand these instructions. °· Will watch your condition. °· Will get help right away if you are not doing well or get worse. °Document Released: 10/05/2004 Document Revised: 08/03/2013 Document Reviewed: 08/11/2012 °ExitCare® Patient Information ©2015 [employer]. This information is not intended to replace advice given to you by your health care provider. Make sure you discuss any questions you have with your health care provider. ° °

## 2013-11-25 NOTE — H&P (View-Only) (Signed)
PCP: Dr. Larose Kells.   HPI  This is a 58 year old male who was referred from new onset right leg claudication. He has no previous cardiac history. He has known history of hyperlipidemia and tobacco use. He started having right calf claudication about 3-4 weeks ago which has been severe since then. It happens after walking about 50-100 feet and results in severe cramping and numbness. It causes him to stop and rest for a few minutes before he can resume. No rest pain or ulceration. He denies chest pain or shortness of breath.  ABI/Duplex: 11/20/2013 Right : 0.48 with abrupt occlusion of distal SFA/poplieal artery with severe tibial disease.  Normal on left.   No Known Allergies   Current Outpatient Prescriptions on File Prior to Visit  Medication Sig Dispense Refill  . ALPRAZolam (XANAX) 0.5 MG tablet Take 1 tablet (0.5 mg total) by mouth 2 (two) times daily as needed for sleep or anxiety.  60 tablet  5  . Coenzyme Q10 10 MG capsule Take 10 mg by mouth daily.      Marland Kitchen dexlansoprazole (DEXILANT) 60 MG capsule Take 1 capsule (60 mg total) by mouth daily.  30 capsule  11  . Magnesium 500 MG CAPS Take 1 capsule by mouth at bedtime.        . metoCLOPramide (REGLAN) 10 MG tablet TAKE 1 TABLET (10 MG TOTAL) BY MOUTH AT BEDTIME.  30 tablet  0  . psyllium (METAMUCIL) 58.6 % powder Take 1 packet by mouth daily.        . ranitidine (ZANTAC) 150 MG tablet TAKE 1 TABLET (150 MG TOTAL) BY MOUTH AT BEDTIME.  30 tablet  6  . Red Yeast Rice Extract (RED YEAST RICE PO) Take 2 capsules by mouth at bedtime.         No current facility-administered medications on file prior to visit.     Past Medical History  Diagnosis Date  . Personal history of colonic polyps     tublar adenomas 2008 & hyerplastic 2011  . Anxiety   . History of diverticulitis of colon   . Esophageal reflux   . Barrett's esophagus   . Hearing loss   . Abnormal LFTs     2008 neg. hep. serology, (-) ANA, ceruloplasmin, etc.;  liver biopsy  showed  minimal active inflammation  . Hiatal hernia      Past Surgical History  Procedure Laterality Date  . Liver biopsy      minimal active inflammation  . Laparoscopically-assisted sigmoid colectomy  11/10/2007     Family History  Problem Relation Age of Onset  . Prostate cancer Father 66  . Esophageal cancer Father   . Coronary artery disease Father     dx age 54, CABG  . Colon cancer Neg Hx   . Heart disease Paternal Uncle     x 2  . Diabetes       History   Social History  . Marital Status: Married    Spouse Name: N/A    Number of Children: 2  . Years of Education: N/A   Occupational History  . Therapist, music, [employer]    Social History Main Topics  . Smoking status: Current Every Day Smoker    Types: Cigarettes  . Smokeless tobacco: Never Used     Comment: 3 cigarettes daily  . Alcohol Use: 0.0 oz/week     Comment: 2 glasses wine glasses daily  . Drug Use: No  . Sexual Activity: Not on  file   Other Topics Concern  . Not on file   Social History Narrative   Born in [place]   Married, 2 step sons     ROS A 10 point review of system was performed. It is negative other than that mentioned in the history of present illness.   PHYSICAL EXAM   BP 150/90  Pulse 78  Ht 5\' 10"  (1.778 m)  Wt 168 lb (76.204 kg)  BMI 24.11 kg/m2 Constitutional: He is oriented to person, place, and time. He appears well-developed and well-nourished. No distress.  HENT: No nasal discharge.  Head: Normocephalic and atraumatic.  Eyes: Pupils are equal and round.  No discharge. Neck: Normal range of motion. Neck supple. No JVD present. No thyromegaly present.  Cardiovascular: Normal rate, regular rhythm, normal heart sounds. Exam reveals no gallop and no friction rub. No murmur heard.  Pulmonary/Chest: Effort normal and breath sounds normal. No stridor. No respiratory distress. He has no wheezes. He has no rales. He exhibits no tenderness.  Abdominal: Soft.  Bowel sounds are normal. He exhibits no distension. There is no tenderness. There is no rebound and no guarding.  Musculoskeletal: Normal range of motion. He exhibits no edema and no tenderness.  Neurological: He is alert and oriented to person, place, and time. Coordination normal.  Skin: Skin is warm and dry. No rash noted. He is not diaphoretic. No erythema. No pallor.  Psychiatric: He has a normal mood and affect. His behavior is normal. Judgment and thought content normal.  Vascular: Femoral pulses are normal. Dorsalis pedis and posterior tibial: Normal in the left and not palpable on the right. The right foot is warm.     EKG: NSR   ASSESSMENT AND PLAN

## 2013-11-25 NOTE — CV Procedure (Signed)
PERIPHERAL VASCULAR PROCEDURE  NAME:  Ryan Smith   MRN: [id number] DOB:  10-22-1955   ADMIT DATE: 11/25/2013  Performing Cardiologist: Kathlyn Sacramento Primary Physician: Kathlene November, MD   Procedures Performed:  Abdominal Aortic Angiogram with Bi-Iliofemoral Runoff  Selective left lower extremity arterial angiography  Selective right lower extremity arterial angiography    Indication(s):   Claudication   Consent: The procedure with Risks/Benefits/Alternatives and Indications was reviewed with the patient .  All questions were answered.  Medications:  Sedation:  2 mg IV Versed, 50 mcg IV Fentanyl  Contrast:  138 ml  Visipaque   Procedural details: The left groin was prepped, draped, and anesthetized with 1% lidocaine. Using modified Seldinger technique, a 5 French sheath was introduced into the left common femoral artery. A 5 Fr Short Pigtail Catheter was advanced of over a  Versicore wire into the descending Aorta to a level just above the renal arteries. A power injection of 7ml/sec contrast over 1 sec was performed for Abdominal Aortic Angiography.  The catheter was then pulled back to a level just above the Aortic bifurcation, and a second power injection was performed to evaluate the iliac arteries.   The pigtail catheter was removed. At this time the injector was directed to the sheath SideArm and ipsilateral artery angiography was performed via power injection of 5  ml/sec contrast for a total of 35 ml.     A crossover catheter was advanced with the wire then pulled back the aortic bifurcation and the wire was advanced down the contralateral common iliac artery.  The wire was then advanced to the contralateral common femoral artery, the catheter was exchanged into an end hole straight tip catheter which was advanced over the wire to the common femoral artery. Contralateral second-order lower extremity angiography was performed via power injection of 5 ml / sec contrast for  a total of 35 ml.    Hemodynamics:  Central Aortic Pressure / Mean Aortic Pressure: 140/70  Findings:  Abdominal aorta: Normal in size with no evidence of aneurysm or significant plaque.  Left renal artery: Normal  Right renal artery: Normal  Celiac artery: Not visualized  Superior mesenteric artery: Patent  Right common iliac artery: Normal  Right internal iliac artery: Normal  Right external iliac artery: Normal  Right common femoral artery: Normal  Right profunda femoral artery: Normal  Right superficial femoral artery: Normal  Right popliteal artery: Occluded above the knee with significant calcifications noted that the proximal cap. That is likely organized thrombus. The occlusion is short with reconstitution via collaterals in the mid popliteal artery.  Right tibial peroneal trunk: 90% proximal disease.  Right anterior tibial artery: Patent proximally but is occluded in the midsegment.  Right peroneal artery: Diffuse 95% proximal disease. This gives collaterals to the dorsalis pedis distally.  Right posterior tibial artery: Occluded proximally.  Left common iliac artery:   normal  Left internal iliac artery: Normal  Left external iliac artery: Normal  Left common femoral artery: Normal  Left profunda femoral artery: Normal  Left superficial femoral artery:  Normal  Left popliteal artery: Normal  Three-vessel runoff below the knee   Conclusions: 1. No significant aortoiliac disease. No significant disease involving the left lower extremity. 2. Subacute thrombotic occlusion of the right popliteal artery with evidence of distal embolization into the tibial peroneal vessels.  Recommendations:  Endovascular intervention will be associated with significant risk of embolization. Unfortunately, the occlusion is more than 42 weeks old and thus  unlikely to respond to thrombolysis. The best option is probably surgical thrombectomy and possible hybrid approach.  I will request vascular surgery consult.   Kathlyn Sacramento, MD, [facility] 11/25/2013 9:18 AM

## 2013-11-25 NOTE — Interval H&P Note (Signed)
History and Physical Interval Note:  11/25/2013 8:36 AM  Ryan Smith  has presented today for surgery, with the diagnosis of pvd  The various methods of treatment have been discussed with the patient and family. After consideration of risks, benefits and other options for treatment, the patient has consented to  Procedure(s): ABDOMINAL AORTAGRAM (N/A) as a surgical intervention .  The patient's history has been reviewed, patient examined, no change in status, stable for surgery.  I have reviewed the patient's chart and labs.  Questions were answered to the patient's satisfaction.     Kathlyn Sacramento

## 2013-11-26 NOTE — Telephone Encounter (Signed)
Follow up     Pt still have not heard from the surgeon's office regarding an appt.   He is anxious.  Please call

## 2013-11-26 NOTE — Telephone Encounter (Signed)
Spoke with Colletta Maryland and Dr Trula Slade will be seeing patient on Monday if patient can make that appointment. Dana from [facility] calling patient to schedule.

## 2013-11-26 NOTE — Telephone Encounter (Signed)
Left another message on Stephanie's voicemail, advised patient.

## 2013-11-27 ENCOUNTER — Encounter: Payer: Self-pay | Admitting: Surgery

## 2013-11-30 ENCOUNTER — Other Ambulatory Visit: Payer: Self-pay | Admitting: Surgery

## 2013-11-30 ENCOUNTER — Ambulatory Visit (INDEPENDENT_AMBULATORY_CARE_PROVIDER_SITE_OTHER): Payer: BC Managed Care – PPO | Admitting: Surgery

## 2013-11-30 ENCOUNTER — Ambulatory Visit (HOSPITAL_COMMUNITY)
Admission: RE | Admit: 2013-11-30 | Discharge: 2013-11-30 | Disposition: A | Discharge status: Home / Self Care | Payer: BC Managed Care – PPO | Source: Ambulatory Visit | Attending: Surgery | Admitting: Surgery

## 2013-11-30 ENCOUNTER — Other Ambulatory Visit: Payer: Self-pay | Admitting: *Deleted

## 2013-11-30 ENCOUNTER — Encounter: Payer: Self-pay | Admitting: Surgery

## 2013-11-30 VITALS — BP 153/93 | HR 79 | Ht 70.0 in | Wt 166.8 lb

## 2013-11-30 DIAGNOSIS — I70219 Atherosclerosis of native arteries of extremities with intermittent claudication, unspecified extremity: Secondary | ICD-10-CM

## 2013-11-30 DIAGNOSIS — I739 Peripheral vascular disease, unspecified: Secondary | ICD-10-CM

## 2013-11-30 DIAGNOSIS — Z01810 Encounter for preprocedural cardiovascular examination: Secondary | ICD-10-CM | POA: Diagnosis present

## 2013-11-30 HISTORY — DX: Atherosclerosis of native arteries of extremities with intermittent claudication, unspecified extremity: I70.219

## 2013-11-30 MED ORDER — CILOSTAZOL 100 MG PO TABS: 100.0000 mg | ORAL_TABLET | Freq: Two times a day (BID) | ORAL | Status: DC

## 2013-11-30 NOTE — Addendum Note (Signed)
Addended by: Dorthula Rue L on: 11/30/2013 03:21 PM   Modules accepted: Orders

## 2013-11-30 NOTE — Addendum Note (Signed)
Addended by: Dorthula Rue L on: 11/30/2013 04:31 PM   Modules accepted: Orders

## 2013-11-30 NOTE — Addendum Note (Signed)
Addended by: Mena Goes on: 11/30/2013 02:27 PM   Modules accepted: Orders

## 2013-11-30 NOTE — Progress Notes (Signed)
Referred by:  Colon Branch, MD [address] [place], [place] [address]  Reason for referral: Subacute thrombotic occlusion of the right popliteal artery  History of Present Illness  Ryan Smith is a 58 y.o. (08/14/1955) male who presents with chief complaint: right leg pain.  Onset of symptom occurred 6 weeks ago.  Pain is described as severe cramping in the right calf associated with walking long distances. He has no symptoms on the left. He has never had pain like this before. He ambulatory and is only able to walk about 50-100 feet before having calf pain. The pain improves with rest. He denies any rest pain and non-healing wounds of the lower extremities.  He was referred over here by Dr. Fletcher Anon following aortogram with bilateral runoff.  Atherosclerotic risk factors include: current smoker, hyperlipidemia.   He denies any previous cardiac history. He does not have hypertension and diabetes. He has hypercholesterolemia but is not managed with statin. He takes a daily aspirin.   On ROS, he denies any chest pain, shortness of breath, chest palpitations, and weakness of the arms and legs. All other systems negative.   Past Medical History  Diagnosis Date  . Personal history of colonic polyps     tublar adenomas 2008 & hyerplastic 2011  . Anxiety   . History of diverticulitis of colon   . Esophageal reflux   . Barrett's esophagus   . Hearing loss   . Abnormal LFTs     2008 neg. hep. serology, (-) ANA, ceruloplasmin, etc.;  liver biopsy showed  minimal active inflammation  . Hiatal hernia     Past Surgical History  Procedure Laterality Date  . Liver biopsy      minimal active inflammation  . Laparoscopically-assisted sigmoid colectomy  11/10/2007    History   Social History  . Marital Status: Married    Spouse Name: N/A    Number of Children: 2  . Years of Education: N/A   Occupational History  . Therapist, music, [employer]    Social History Main Topics    . Smoking status: Current Every Day Smoker -- 0.25 packs/day for 30 years    Types: Cigarettes  . Smokeless tobacco: Never Used     Comment: 3 cigarettes daily  . Alcohol Use: No     Comment: 2 glasses wine glasses daily  . Drug Use: No  . Sexual Activity: Not on file   Other Topics Concern  . Not on file   Social History Narrative   Born in [place]   Married, 2 step sons    Family History  Problem Relation Age of Onset  . Prostate cancer Father 3  . Esophageal cancer Father   . Coronary artery disease Father     dx age 79, CABG  . Heart disease Father   . Colon cancer Neg Hx   . Heart disease Paternal Uncle     x 2  . Diabetes      Current Outpatient Prescriptions on File Prior to Visit  Medication Sig Dispense Refill  . ALPRAZolam (XANAX) 0.5 MG tablet Take 1 tablet (0.5 mg total) by mouth 2 (two) times daily as needed for sleep or anxiety.  60 tablet  5  . aspirin EC 81 MG tablet Take 162 mg by mouth daily.       . Coenzyme Q10 10 MG capsule Take 10 mg by mouth daily.      Marland Kitchen dexlansoprazole (DEXILANT)  60 MG capsule Take 1 capsule (60 mg total) by mouth daily.  30 capsule  11  . Magnesium 500 MG CAPS Take 1 capsule by mouth at bedtime.        . psyllium (METAMUCIL) 58.6 % powder Take 1 packet by mouth at bedtime.       . ranitidine (ZANTAC) 150 MG tablet Take 150 mg by mouth at bedtime.      . Red Yeast Rice Extract (RED YEAST RICE PO) Take 2 capsules by mouth at bedtime.         No current facility-administered medications on file prior to visit.    No Known Allergies  For [facility] Use Only  PRE-ADM LIVING: Home  AMB STATUS: Ambulatory  CAD Sx: None  PRIOR CHF: None  STRESS TEST: [ x] No, [ ]  Normal, [ ]  + ischemia, [ ]  + MI, [ ]  Both   Physical Examination Filed Vitals:   11/30/13 1230  BP: 153/93  Pulse: 79  Height: 5\' 10"  (1.778 m)  Weight: 166 lb 12.8 oz (75.66 kg)  SpO2: 100%   Body mass index is 23.93 kg/(m^2).  General: A&O x 3,  WDWN male in NAD  Head: [place]/AT  Eyes: EOMI  Neck: Supple, no nuchal rigidity  Pulmonary: Sym exp, good air movt, CTAB, no rales, rhonchi, & wheezing  Cardiac: RRR, Nl S1, S2, no Murmurs, rubs or gallops, without carotid bruits  Vascular: Vessel Right Left  Radial Palpable Palpable  Aorta Not palpable N/A  Femoral Palpable Palpable  Popliteal Not palpable Not palpable  PT Not palpable Palpable  DP Not palpable Palpable   Gastrointestinal: soft, NTND, -G/R, - HSM, - masses  Musculoskeletal: M/S 5/5 throughout.  Extremities without ischemic changes  Neurologic: CN 2-12 grossly intact. Pain and light touch intact in extremities   Psychiatric: Judgment intact, Mood & affect appropriatefor pt's clinical situation  Dermatologic: See M/S exam for extremity exam, no rashes otherwise noted  Non-Invasive Vascular Imaging  ABI (Date: 11/20/2013)  R: 0.48   L: Normal  Outside Studies/Documentation Outside documents were reviewed including: aortogram with bilateral runoff by Dr. Fletcher Anon  Medical Decision Making  Ryan Smith is a 58 y.o. male who presents with:  intermittent claudication of the right leg. His arteriogram shows subacute thrombotic occlusion of the right popliteal artery with evidence of distal embolization into the tibial peroneal arteries. He does have evidence of atherosclerotic disease in his arteries. There is evidence of popliteal aneurysm on arteriogram nor on physical exam. He will have a lower extremity MRI to rule out popliteal entrapment syndrome and CT of the chest to rule out a thoracic embolus source.   He does not have any rest pain or non healing wounds of the legs. He is not at risk for limb loss. Bypass intervention was discussed with the patient but he is reluctant to proceed at this time. He would like to proceed conservatively at this point. I discussed in depth with the patient a walking plan and how to execute such.The patient is interested in  starting cilostazol. He will proceed with a one month trial of the medication.  He will follow up in 4 weeks following his studies and trial of cilostazol.   Virgina Jock, PA-C [facility] Office: [phone] Pager: [phone]  11/30/2013, 12:57 PM   This patient was seen in conjunction with Dr. Trula Slade I agree with the above.  TEE allergy of the patient's popliteal occlusion is uncertain at this  time.  It does appear to be somewhat acute.  This raises the question of popliteal entrapment vs. embolic disease, as the patient does not have significant atherosclerotic findings.  Prior to revascularization I would like to determine the etiology of possible.  After reviewing his abdominal aortogram and CT scan from several years ago, I do not see any potential embolic source within the abdominal aorta.  I am going to get a CTA of the chest to exclude thoracic aorta causes.  Also, I am going to order an MRI to rule out popliteal entrapment.  The patient will be started on cilostazol to see if this improves his walking distance and he will followup with me in one month.  He did get vein mapping today, and he had a marginal vein.  Annamarie Major

## 2013-12-01 ENCOUNTER — Telehealth: Payer: Self-pay | Admitting: Surgery

## 2013-12-01 NOTE — Telephone Encounter (Signed)
Spoke with pt - gave appt info: [facility] 12/30/13 10:00am for MRA and CTA - no solid food 4 hrs prior F/u [facility] 01/04/14 1:30 pm.    Pt verbalized understanding.

## 2013-12-15 ENCOUNTER — Encounter: Payer: Self-pay | Admitting: *Deleted

## 2013-12-15 NOTE — Telephone Encounter (Signed)
Per Dr. Trula Slade, walking will not cause any clots to break off. Walking would be beneficial for Mr. Wedel.

## 2013-12-30 ENCOUNTER — Ambulatory Visit (HOSPITAL_COMMUNITY)
Admission: RE | Admit: 2013-12-30 | Discharge: 2013-12-30 | Disposition: A | Discharge status: Home / Self Care | Payer: BC Managed Care – PPO | Source: Ambulatory Visit | Attending: Surgery | Admitting: Surgery

## 2013-12-30 ENCOUNTER — Other Ambulatory Visit: Payer: Self-pay | Admitting: Surgery

## 2013-12-30 DIAGNOSIS — I2510 Atherosclerotic heart disease of native coronary artery without angina pectoris: Secondary | ICD-10-CM | POA: Diagnosis not present

## 2013-12-30 DIAGNOSIS — I70219 Atherosclerosis of native arteries of extremities with intermittent claudication, unspecified extremity: Secondary | ICD-10-CM

## 2013-12-30 DIAGNOSIS — I700 Atherosclerosis of aorta: Secondary | ICD-10-CM | POA: Insufficient documentation

## 2013-12-30 DIAGNOSIS — R911 Solitary pulmonary nodule: Secondary | ICD-10-CM | POA: Diagnosis not present

## 2013-12-30 DIAGNOSIS — K219 Gastro-esophageal reflux disease without esophagitis: Secondary | ICD-10-CM | POA: Diagnosis not present

## 2013-12-30 DIAGNOSIS — R7989 Other specified abnormal findings of blood chemistry: Secondary | ICD-10-CM | POA: Diagnosis present

## 2013-12-30 MED ORDER — GADOBENATE DIMEGLUMINE 529 MG/ML IV SOLN
25.0000 mL | Freq: Once | INTRAVENOUS | Status: AC
  Administered 2013-12-30: 22 mL via INTRAVENOUS

## 2013-12-30 MED ORDER — IOHEXOL 350 MG/ML SOLN
80.0000 mL | Freq: Once | INTRAVENOUS | Status: AC | PRN
  Administered 2013-12-30: 80 mL via INTRAVENOUS

## 2014-01-01 ENCOUNTER — Encounter: Payer: Self-pay | Admitting: Surgery

## 2014-01-04 ENCOUNTER — Encounter: Payer: Self-pay | Admitting: Surgery

## 2014-01-04 ENCOUNTER — Ambulatory Visit (INDEPENDENT_AMBULATORY_CARE_PROVIDER_SITE_OTHER): Payer: BC Managed Care – PPO | Admitting: Surgery

## 2014-01-04 VITALS — BP 160/92 | HR 72 | Ht 70.0 in | Wt 170.0 lb

## 2014-01-04 DIAGNOSIS — I739 Peripheral vascular disease, unspecified: Secondary | ICD-10-CM

## 2014-01-04 HISTORY — DX: Peripheral vascular disease, unspecified: I73.9

## 2014-01-04 NOTE — Progress Notes (Signed)
Patient name: Ryan Smith MRN: [id number] DOB: 06-Apr-1955 Sex: male     Chief Complaint  Patient presents with  . Re-evaluation    1 month f/u     HISTORY OF PRESENT ILLNESS: The patient is back today for followup.  He initially presented with right leg pain.  He underwent angiography by Dr. Fletcher Anon which revealed a subacute thrombotic occlusion of the right popliteal artery.  It also appeared that he had distal embolization.  The patient's symptoms were claudication that occurs approximately 1/2 block.  He has not had rest pain or ulceration.  I attempted to treat him with cilostazol which he said caused an increase in heart rate.  I also ordered a CT scan of the chest to look for a source of emboli. and a MRA of the lower extremity to evaluate for popliteal entrapment. He comes in today for further discussion. Past Medical History  Diagnosis Date  . Personal history of colonic polyps     tublar adenomas 2008 & hyerplastic 2011  . Anxiety   . History of diverticulitis of colon   . Esophageal reflux   . Barrett's esophagus   . Hearing loss   . Abnormal LFTs     2008 neg. hep. serology, (-) ANA, ceruloplasmin, etc.;  liver biopsy showed  minimal active inflammation  . Hiatal hernia     Past Surgical History  Procedure Laterality Date  . Liver biopsy      minimal active inflammation  . Laparoscopically-assisted sigmoid colectomy  11/10/2007    History   Social History  . Marital Status: Married    Spouse Name: N/A    Number of Children: 2  . Years of Education: N/A   Occupational History  . Therapist, music, [employer]    Social History Main Topics  . Smoking status: Current Every Day Smoker -- 0.25 packs/day for 30 years    Types: Cigarettes  . Smokeless tobacco: Never Used     Comment: 3 cigarettes daily  . Alcohol Use: No     Comment: 2 glasses wine glasses daily  . Drug Use: No  . Sexual Activity: Not on file   Other Topics Concern  . Not on file    Social History Narrative   Born in [place]   Married, 2 step sons    Family History  Problem Relation Age of Onset  . Prostate cancer Father 5  . Esophageal cancer Father   . Coronary artery disease Father     dx age 62, CABG  . Heart disease Father   . Colon cancer Neg Hx   . Heart disease Paternal Uncle     x 2  . Diabetes      Allergies as of 01/04/2014  . (No Known Allergies)    Current Outpatient Prescriptions on File Prior to Visit  Medication Sig Dispense Refill  . ALPRAZolam (XANAX) 0.5 MG tablet Take 1 tablet (0.5 mg total) by mouth 2 (two) times daily as needed for sleep or anxiety.  60 tablet  5  . aspirin EC 81 MG tablet Take 162 mg by mouth daily.       . Coenzyme Q10 10 MG capsule Take 10 mg by mouth daily.      Marland Kitchen dexlansoprazole (DEXILANT) 60 MG capsule Take 1 capsule (60 mg total) by mouth daily.  30 capsule  11  . Magnesium 500 MG CAPS Take 1 capsule by mouth at bedtime.        Marland Kitchen  psyllium (METAMUCIL) 58.6 % powder Take 1 packet by mouth at bedtime.       . ranitidine (ZANTAC) 150 MG tablet Take 150 mg by mouth at bedtime.      . Red Yeast Rice Extract (RED YEAST RICE PO) Take 2 capsules by mouth at bedtime.        . cilostazol (PLETAL) 100 MG tablet Take 1 tablet (100 mg total) by mouth 2 (two) times daily before a meal.  60 tablet  11   No current facility-administered medications on file prior to visit.     REVIEW OF SYSTEMS: No significant change from prior visit with the exception of tachycardia with cilostazol.  PHYSICAL EXAMINATION:   Vital signs are BP 160/92  Pulse 72  Ht 5\' 10"  (1.778 m)  Wt 170 lb (77.111 kg)  BMI 24.39 kg/m2  SpO2 100% General: The patient appears their stated age. HEENT:  No gross abnormalities Pulmonary:  Non labored breathing Abdomen: Soft and non-tender Musculoskeletal: There are no major deformities. Neurologic: No focal weakness or paresthesias are detected, Skin: There are no ulcer or rashes  noted. Psychiatric: The patient has normal affect. Cardiovascular: There is a regular rate and rhythm without significant murmur appreciated.   Diagnostic Studies CT scan of the chest did not show a source for the emboli but did show coronary calcification. MRA did not confirm popliteal entrapment syndrome.  Assessment: Right lower extremity claudication Plan: The patient did not have significant benefit from cilostazol, and therefore I told him to discontinue this.  We have decided to proceed with revascularization.  He is trying to find a time and is scheduled to accommodate having surgery.  He will need an above-knee to below-knee popliteal bypass graft on the right.  He will likely need vein harvesting from the upper leg given the measurements on vein mapping.  He'll contact me with time and date for the operation  Because of the coronary calcifications on the CT scan, I am going to send him back to Dr. Fletcher Anon for formal cardiac clearance  V. Leia Alf, M.D. [facility] Office: [phone] Pager:  [phone]

## 2014-01-18 ENCOUNTER — Other Ambulatory Visit (INDEPENDENT_AMBULATORY_CARE_PROVIDER_SITE_OTHER): Payer: BC Managed Care – PPO

## 2014-01-18 DIAGNOSIS — E785 Hyperlipidemia, unspecified: Secondary | ICD-10-CM

## 2014-01-18 LAB — LIPID PANEL
Cholesterol: 200 mg/dL (ref 0–200)
HDL: 41.8 mg/dL (ref >39.00)
LDL Cholesterol: 139 mg/dL — ABNORMAL HIGH (ref 0–99)
NonHDL: 158.2
Total CHOL/HDL Ratio: 5
Triglycerides: 96 mg/dL (ref 0.0–149.0)
VLDL: 19.2 mg/dL (ref 0.0–40.0)

## 2014-01-19 ENCOUNTER — Telehealth: Payer: Self-pay

## 2014-01-19 ENCOUNTER — Ambulatory Visit (INDEPENDENT_AMBULATORY_CARE_PROVIDER_SITE_OTHER): Payer: BC Managed Care – PPO | Admitting: Cardiovascular Disease

## 2014-01-19 ENCOUNTER — Encounter: Payer: Self-pay | Admitting: Cardiovascular Disease

## 2014-01-19 VITALS — BP 134/82 | HR 88 | Ht 70.0 in | Wt 173.1 lb

## 2014-01-19 DIAGNOSIS — Z720 Tobacco use: Secondary | ICD-10-CM

## 2014-01-19 DIAGNOSIS — Z01810 Encounter for preprocedural cardiovascular examination: Secondary | ICD-10-CM | POA: Insufficient documentation

## 2014-01-19 DIAGNOSIS — I739 Peripheral vascular disease, unspecified: Secondary | ICD-10-CM

## 2014-01-19 DIAGNOSIS — R0602 Shortness of breath: Secondary | ICD-10-CM

## 2014-01-19 DIAGNOSIS — E785 Hyperlipidemia, unspecified: Secondary | ICD-10-CM

## 2014-01-19 HISTORY — DX: Encounter for preprocedural cardiovascular examination: Z01.810

## 2014-01-19 MED ORDER — ATORVASTATIN CALCIUM 40 MG PO TABS: 40.0000 mg | ORAL_TABLET | Freq: Every evening | ORAL | Status: DC

## 2014-01-19 MED ORDER — ALPRAZOLAM 0.5 MG PO TABS: 0.5000 mg | ORAL_TABLET | Freq: Two times a day (BID) | ORAL | Status: DC | PRN

## 2014-01-19 NOTE — Assessment & Plan Note (Signed)
I had a prolonged discussion with him about the importance of smoking cessation. He is down to a few cigarettes a day and is determined to quit.

## 2014-01-19 NOTE — Progress Notes (Signed)
PCP: Dr. Larose Kells.   HPI  This is a 58 year old male who is here today for a follow u visit regarding PAD. He has no previous cardiac history. He has known history of hyperlipidemia and tobacco use. He was seen for right calf claudication in August at short distance.  ABI/Duplex: 11/20/2013 Right : 0.48 with abrupt occlusion of distal SFA/poplieal artery with severe tibial disease.  Normal on left.   Angiography showed: 1. No significant aortoiliac disease. No significant disease involving the left lower extremity.  2. Subacute thrombotic occlusion of the right popliteal artery with evidence of distal embolization into the tibial peroneal vessels.  I referred him to Dr. Trula Slade to consider bypass. He was started on Pletal with no significant improvement. He also had palpitations with it. He had CTA of aorta which showed no source of embolism. He was noted to have LAD calcifications on CT scan. He denies any chest discomfort although he does have mild  exertional dyspnea.   No Known Allergies   Current Outpatient Prescriptions on File Prior to Visit  Medication Sig Dispense Refill  . ALPRAZolam (XANAX) 0.5 MG tablet Take 1 tablet (0.5 mg total) by mouth 2 (two) times daily as needed for sleep or anxiety.  60 tablet  5  . aspirin EC 81 MG tablet Take 162 mg by mouth daily.       . Coenzyme Q10 10 MG capsule Take 10 mg by mouth daily.      Marland Kitchen dexlansoprazole (DEXILANT) 60 MG capsule Take 1 capsule (60 mg total) by mouth daily.  30 capsule  11  . Magnesium 500 MG CAPS Take 1 capsule by mouth at bedtime.        . psyllium (METAMUCIL) 58.6 % powder Take 1 packet by mouth at bedtime.       . ranitidine (ZANTAC) 150 MG tablet Take 150 mg by mouth at bedtime.       No current facility-administered medications on file prior to visit.     Past Medical History  Diagnosis Date  . Personal history of colonic polyps     tublar adenomas 2008 & hyerplastic 2011  . Anxiety   . History of  diverticulitis of colon   . Esophageal reflux   . Barrett's esophagus   . Hearing loss   . Abnormal LFTs     2008 neg. hep. serology, (-) ANA, ceruloplasmin, etc.;  liver biopsy showed  minimal active inflammation  . Hiatal hernia      Past Surgical History  Procedure Laterality Date  . Liver biopsy      minimal active inflammation  . Laparoscopically-assisted sigmoid colectomy  11/10/2007     Family History  Problem Relation Age of Onset  . Prostate cancer Father 73  . Esophageal cancer Father   . Coronary artery disease Father     dx age 63, CABG  . Heart disease Father   . Colon cancer Neg Hx   . Heart disease Paternal Uncle     x 2  . Diabetes       History   Social History  . Marital Status: Married    Spouse Name: N/A    Number of Children: 2  . Years of Education: N/A   Occupational History  . Therapist, music, [employer]    Social History Main Topics  . Smoking status: Current Every Day Smoker -- 0.25 packs/day for 30 years    Types: Cigarettes  . Smokeless tobacco: Never Used  Comment: 3 cigarettes daily  . Alcohol Use: No     Comment: 2 glasses wine glasses daily  . Drug Use: No  . Sexual Activity: Not on file   Other Topics Concern  . Not on file   Social History Narrative   Born in [place]   Married, 2 step sons     ROS A 10 point review of system was performed. It is negative other than that mentioned in the history of present illness.   PHYSICAL EXAM   BP 134/82  Pulse 88  Ht 5\' 10"  (1.778 m)  Wt 173 lb 1.9 oz (78.527 kg)  BMI 24.84 kg/m2 Constitutional: He is oriented to person, place, and time. He appears well-developed and well-nourished. No distress.  HENT: No nasal discharge.  Head: Normocephalic and atraumatic.  Eyes: Pupils are equal and round.  No discharge. Neck: Normal range of motion. Neck supple. No JVD present. No thyromegaly present.  Cardiovascular: Normal rate, regular rhythm, normal heart sounds.  Exam reveals no gallop and no friction rub. No murmur heard.  Pulmonary/Chest: Effort normal and breath sounds normal. No stridor. No respiratory distress. He has no wheezes. He has no rales. He exhibits no tenderness.  Abdominal: Soft. Bowel sounds are normal. He exhibits no distension. There is no tenderness. There is no rebound and no guarding.  Musculoskeletal: Normal range of motion. He exhibits no edema and no tenderness.  Neurological: He is alert and oriented to person, place, and time. Coordination normal.  Skin: Skin is warm and dry. No rash noted. He is not diaphoretic. No erythema. No pallor.  Psychiatric: He has a normal mood and affect. His behavior is normal. Judgment and thought content normal.  Vascular: Femoral pulses are normal. Dorsalis pedis and posterior tibial: Normal in the left and not palpable on the right. The right foot is warm.       ASSESSMENT AND PLAN

## 2014-01-19 NOTE — Telephone Encounter (Signed)
Pt is requesting refill on Alprazolam  Last OV: 11/18/2013 Last Fill: 07/17/2013 # [id number] [phone] RF UDS: 07/24/2013 High Risk  Please advise.

## 2014-01-19 NOTE — Telephone Encounter (Signed)
Faxed to [facility].

## 2014-01-19 NOTE — Patient Instructions (Signed)
Your physician has requested that you have an exercise stress myoview. For further information please visit [email or web address]. Please follow instruction sheet, as given.  Your physician has recommended you make the following change in your medication:  1. STOP Red yeast rice 2. START Atorvastatin 40mg  take one by mouth every evening  Your physician recommends that you return for a FASTING LIPID and LIVER profile in 6 WEEKS--nothing to eat or drink after midnight, lab opens at 7:30 AM  Your physician wants you to follow-up in: 6 MONTHS with [name].  You will receive a reminder letter in the mail two months in advance. If you don't receive a letter, please call our office to schedule the follow-up appointment.

## 2014-01-19 NOTE — Telephone Encounter (Signed)
Okay Alprazolam #60, no refills. Needs UDS at the time of prescription pickup

## 2014-01-19 NOTE — Assessment & Plan Note (Signed)
Lab Results  Component Value Date   CHOL 200 01/18/2014   HDL 41.80 01/18/2014   LDLCALC 139* 01/18/2014   LDLDIRECT 145.2 04/10/2011   TRIG 96.0 01/18/2014   CHOLHDL 5 01/18/2014   Due to peripheral arterial disease and coronary calcifications, I recommend treatment with a statin and a target LDL of less than 70. I discontinued Red yeast rice and started on atorvastatin 40 mg once daily. Check fasting lipid and liver profile in 6 weeks.

## 2014-01-19 NOTE — Assessment & Plan Note (Signed)
He reports gradual improvement in the right calf claudication. He is able to walk one mile before having significant discomfort. Occasionally, he has to stop it has minor and rest for a few minutes. He is thinking about his options of proceeding with surgery versus continued medical therapy.

## 2014-01-19 NOTE — Assessment & Plan Note (Signed)
The patient might need to go right leg bypass surgery. He endorses exertional dyspnea without chest pain. Also recent CT scan showed incidental finding of LAD calcifications. Due to that, I recommend evaluation with a treadmill nuclear stress test. Continue aggressive treatment of risk factors.

## 2014-01-20 MED ORDER — ROSUVASTATIN CALCIUM 5 MG PO TABS: 5.0000 mg | ORAL_TABLET | Freq: Every day | ORAL | Status: DC

## 2014-01-20 NOTE — Addendum Note (Signed)
Addended by: Wilfrid Lund on: 01/20/2014 04:15 PM   Modules accepted: Orders

## 2014-01-27 ENCOUNTER — Ambulatory Visit (HOSPITAL_COMMUNITY): Payer: BC Managed Care – PPO | Attending: Cardiovascular Disease | Admitting: Radiology

## 2014-01-27 VITALS — BP 134/83 | Ht 70.0 in | Wt 170.0 lb

## 2014-01-27 DIAGNOSIS — E785 Hyperlipidemia, unspecified: Secondary | ICD-10-CM | POA: Diagnosis not present

## 2014-01-27 DIAGNOSIS — R0609 Other forms of dyspnea: Secondary | ICD-10-CM | POA: Insufficient documentation

## 2014-01-27 DIAGNOSIS — I739 Peripheral vascular disease, unspecified: Secondary | ICD-10-CM | POA: Insufficient documentation

## 2014-01-27 DIAGNOSIS — R0602 Shortness of breath: Secondary | ICD-10-CM

## 2014-01-27 MED ORDER — TECHNETIUM TC 99M SESTAMIBI GENERIC - CARDIOLITE
33.0000 | Freq: Once | INTRAVENOUS | Status: AC | PRN
  Administered 2014-01-27: 33 via INTRAVENOUS

## 2014-01-27 MED ORDER — TECHNETIUM TC 99M SESTAMIBI GENERIC - CARDIOLITE
11.0000 | Freq: Once | INTRAVENOUS | Status: AC | PRN
Start: 2014-01-27 — End: 2014-01-27
  Administered 2014-01-27: 11 via INTRAVENOUS

## 2014-01-27 NOTE — Progress Notes (Signed)
[facility] [address] [place], [place] [address] [phone]    Cardiology Nuclear Med Study  Ryan Smith is a 58 y.o. male     MRN : [id number]     DOB: [birth year]  Procedure Date: 01/27/2014  Nuclear Med Background Indication for Stress Test:  Evaluation for Ischemia, and possible (R) Fem-Popliteal by V. Annamarie Major, MD History:  9 yrs ago MPI: [facility] No report Cardiac Risk Factors: Lipids and PVD  Symptoms:  DOE   Nuclear Pre-Procedure Caffeine/Decaff Intake:  None> 12 hrs NPO After: 8:00pm   Lungs:  clear O2 Sat: 99% on room air. IV 0.9% NS with Angio Cath:  22g  IV Site: R Hand x 1, tolerated well IV Started by:  Irven Baltimore, RN  Chest Size (in):  40 Cup Size: n/a  Height: 5\' 10"  (1.778 m)  Weight:  170 lb (77.111 kg)  BMI:  Body mass index is 24.39 kg/(m^2). Tech Comments:  N/A    Nuclear Med Study 1 or 2 day study: 1 day  Stress Test Type:  Stress  Reading MD: N/A  Order Authorizing Provider:  Kathlyn Sacramento, MD  Resting Radionuclide: Technetium 75m Sestamibi  Resting Radionuclide Dose: 11.0 mCi   Stress Radionuclide:  Technetium 30m Sestamibi  Stress Radionuclide Dose: 33.0 mCi           Stress Protocol Rest HR: 70 Stress HR: 166  Rest BP: 134/83 Stress BP: 212/101  Exercise Time (min): 6:31 METS: 7.70   Predicted Max HR: 161 bpm % Max HR: 103.11 bpm Rate Pressure Product: 35192   Dose of Adenosine (mg):  n/a Dose of Lexiscan: n/a mg  Dose of Atropine (mg): n/a Dose of Dobutamine: n/a mcg/kg/min (at max HR)  Stress Test Technologist: Perrin Maltese, EMT-P  Nuclear Technologist:  Earl Many, CNMT     Rest Procedure:  Myocardial perfusion imaging was performed at rest 45 minutes following the intravenous administration of Technetium 45m Sestamibi. Rest ECG: NSR - Normal EKG  Stress Procedure:  The patient exercised on the treadmill utilizing the Bruce Protocol for 6:31 minutes. The patient stopped due to  fatigue, (R) leg pain and denied any chest pain.  Technetium 68m Sestamibi was injected at peak exercise and myocardial perfusion imaging was performed after a brief delay. Stress ECG: No significant change from baseline ECG  QPS Raw Data Images:  Normal; no motion artifact; normal heart/lung ratio. Stress Images:  Normal homogeneous uptake in all areas of the myocardium. Rest Images:  Normal homogeneous uptake in all areas of the myocardium. Subtraction (SDS):  No evidence of ischemia. Transient Ischemic Dilatation (Normal <1.22):  0.99 Lung/Heart Ratio (Normal <0.45):  0.24  Quantitative Gated Spect Images QGS EDV:  103 ml QGS ESV:  43 ml  Impression Exercise Capacity:  Good exercise capacity. BP Response:  Normal blood pressure response. Clinical Symptoms:  There is dyspnea. ECG Impression:  No significant ST segment change suggestive of ischemia. Comparison with Prior Nuclear Study: No previous nuclear study performed  Overall Impression:  Normal stress nuclear study.  LV Ejection Fraction: 58%.  LV Wall Motion:  NL LV Function; NL Wall Motion  Darlin Coco MD

## 2014-02-05 NOTE — Progress Notes (Signed)
Phone call to pt. to follow up on scheduling surgery for "Right Above Knee to Below Knee Bypass Graft."  Pt. Stated he does not wish to schedule surgery at this time, and will call office when he is ready.

## 2014-02-15 ENCOUNTER — Telehealth: Payer: Self-pay | Admitting: Cardiovascular Disease

## 2014-02-15 NOTE — Telephone Encounter (Signed)
Left message on machine for pt to contact the office.   

## 2014-02-15 NOTE — Telephone Encounter (Signed)
New Message:  Pt called in stating that Dr. Fletcher Anon prescribed a statin for him to take couple of weeks and he is having some side effects from it, constipation. He would like to know if that is another statin that he can take. Please call  Thanks

## 2014-02-16 NOTE — Telephone Encounter (Signed)
Left message on machine for pt to contact the office.   

## 2014-02-23 ENCOUNTER — Telehealth: Payer: Self-pay

## 2014-02-23 MED ORDER — ALPRAZOLAM 0.5 MG PO TABS: 0.5000 mg | ORAL_TABLET | Freq: Two times a day (BID) | ORAL | Status: DC | PRN

## 2014-02-23 NOTE — Telephone Encounter (Signed)
Pt is requesting refill on Alprazolam  Last OV: 11/18/2013  Last Fill: 01/19/2014 # 60 0RF UDS: 07/24/2013 High Risk  Please advise.

## 2014-02-23 NOTE — Telephone Encounter (Signed)
UDS was requested  the last time I RF meds but I don't see the results in the chart. Prescription printed, asked patient to come to the office to pick up the rx  and provide a urine sample

## 2014-02-23 NOTE — Telephone Encounter (Signed)
Spoke with Pt, informed him he will not be able to refill medication until he comes to office for UDS. Pt verbalized understanding and stated he would come to office tomorrow to give urine sample.

## 2014-03-02 ENCOUNTER — Other Ambulatory Visit: Payer: Self-pay

## 2014-03-02 ENCOUNTER — Other Ambulatory Visit: Payer: BC Managed Care – PPO

## 2014-03-02 ENCOUNTER — Telehealth: Payer: Self-pay | Admitting: Cardiovascular Disease

## 2014-03-02 NOTE — Telephone Encounter (Signed)
New problem   Pt has questions concerning medication he was put on by [name]. Please call pt.

## 2014-03-02 NOTE — Telephone Encounter (Signed)
Received sticky note informing me that Pt had called and to call him back at [phone]. Tried returning call had to leave message on voicemail.

## 2014-03-02 NOTE — Telephone Encounter (Signed)
I spoke with the pt and he complained of constipation while taking Atorvastatin 40mg .  The pt states that he had to take stool softeners and Mirilax on a daily basis while taking Atorvastatin.  The pt stopped Atorvastatin 1 week ago and his bowel function returned to normal.  The pt does have a history of diverticulitis and partial colectomy 7 years ago. The pt has not discussed his constipation with his GI physician. The pt denies abdominal pain. I advised the pt to remain off of Atorvastatin at this time and I will forward this message to [name] for further recommendations.

## 2014-03-03 ENCOUNTER — Encounter: Payer: Self-pay | Admitting: Medical

## 2014-03-03 ENCOUNTER — Ambulatory Visit (INDEPENDENT_AMBULATORY_CARE_PROVIDER_SITE_OTHER): Payer: BC Managed Care – PPO | Admitting: Medical

## 2014-03-03 VITALS — BP 147/94 | HR 81 | Temp 98.0°F | Ht 70.0 in | Wt 174.0 lb

## 2014-03-03 DIAGNOSIS — H6120 Impacted cerumen, unspecified ear: Secondary | ICD-10-CM

## 2014-03-03 DIAGNOSIS — H6123 Impacted cerumen, bilateral: Secondary | ICD-10-CM

## 2014-03-03 HISTORY — DX: Impacted cerumen, unspecified ear: H61.20

## 2014-03-03 MED ORDER — ALPRAZOLAM 0.5 MG PO TABS: 0.5000 mg | ORAL_TABLET | Freq: Two times a day (BID) | ORAL | Status: DC | PRN

## 2014-03-03 NOTE — Progress Notes (Signed)
Subjective:    Patient ID: Ryan Smith, male    DOB: 1955-07-03, 58 y.o.   MRN: [id number]  HPI   Pt in for refill of his xanax. Has been on for 4-5 years. Pt takes it usually just one time in the evening. Occasionally will take it at work. Pt has tried some sertraline. Pt denies depression.   Pt also has rt ear wax cerumen. Pt lost hearing aid tip. He speculates that it may be mixed in with wax. No current uri symptoms. Just can't hear from rt side.  Past Medical History  Diagnosis Date  . Personal history of colonic polyps     tublar adenomas 2008 & hyerplastic 2011  . Anxiety   . History of diverticulitis of colon   . Esophageal reflux   . Barrett's esophagus   . Hearing loss   . Abnormal LFTs     2008 neg. hep. serology, (-) ANA, ceruloplasmin, etc.;  liver biopsy showed  minimal active inflammation  . Hiatal hernia     History   Social History  . Marital Status: Married    Spouse Name: N/A    Number of Children: 2  . Years of Education: N/A   Occupational History  . Therapist, music, [employer]    Social History Main Topics  . Smoking status: Current Every Day Smoker -- 0.25 packs/day for 30 years    Types: Cigarettes  . Smokeless tobacco: Never Used     Comment: 3 cigarettes daily  . Alcohol Use: No     Comment: 2 glasses wine glasses daily  . Drug Use: No  . Sexual Activity: Not on file   Other Topics Concern  . Not on file   Social History Narrative   Born in [place]   Married, 2 step sons    Past Surgical History  Procedure Laterality Date  . Liver biopsy      minimal active inflammation  . Laparoscopically-assisted sigmoid colectomy  11/10/2007    Family History  Problem Relation Age of Onset  . Prostate cancer Father 6  . Esophageal cancer Father   . Coronary artery disease Father     dx age 59, CABG  . Heart disease Father   . Colon cancer Neg Hx   . Heart disease Paternal Uncle     x 2  . Diabetes      No Known  Allergies  Current Outpatient Prescriptions on File Prior to Visit  Medication Sig Dispense Refill  . ALPRAZolam (XANAX) 0.5 MG tablet Take 1 tablet (0.5 mg total) by mouth 2 (two) times daily as needed for sleep or anxiety. 60 tablet 0  . aspirin EC 81 MG tablet Take 162 mg by mouth daily.     Marland Kitchen atorvastatin (LIPITOR) 40 MG tablet Take 1 tablet (40 mg total) by mouth every evening. 90 tablet 3  . Coenzyme Q10 10 MG capsule Take 10 mg by mouth daily.    Marland Kitchen dexlansoprazole (DEXILANT) 60 MG capsule Take 1 capsule (60 mg total) by mouth daily. 30 capsule 11  . Magnesium 500 MG CAPS Take 1 capsule by mouth at bedtime.      . psyllium (METAMUCIL) 58.6 % powder Take 1 packet by mouth at bedtime.     . ranitidine (ZANTAC) 150 MG tablet Take 150 mg by mouth at bedtime.     No current facility-administered medications on file prior to visit.    BP 147/94 mmHg  Pulse 81  Temp(Src) 98  F (36.7 C) (Oral)  Ht 5\' 10"  (1.778 m)  Wt 174 lb (78.926 kg)  BMI 24.97 kg/m2  SpO2 100%      Review of Systems  Constitutional: Negative for fever, chills and fatigue.  HENT: Negative for congestion, ear discharge, ear pain, facial swelling, nosebleeds, postnasal drip, rhinorrhea, sinus pressure, sneezing and sore throat.        Ear feels blocked rt side. Like wax.  Respiratory: Negative for cough, chest tightness, shortness of breath and wheezing.   Cardiovascular: Negative for palpitations.  Gastrointestinal: Negative for diarrhea.  Neurological: Negative for dizziness, tremors, seizures, syncope, facial asymmetry, weakness, light-headedness, numbness and headaches.  Hematological: Negative for adenopathy. Does not bruise/bleed easily.  Psychiatric/Behavioral: Negative for suicidal ideas, behavioral problems, self-injury and dysphoric mood. The patient is nervous/anxious.        Objective:   Physical Exam   General- No acute distress, Pleasant,  Neck- From, No nucal rigidity, No jvd. HEENT-    Head- normocephalic, atraumatic, Ears- canals both sides severe wax obstruction  Eyes- PEERL bilaterally. Nose- faint boggy turbinates bilateraly. Posterior pharynx- no tonsillar hypertrophy, no exudate. Mild pnd  Lungs- clear even and unlabored.  Heart- Regular, rate and rhythm.  Neurologic- Cranial nerves III-XII grossly intact.           Assessment & Plan:

## 2014-03-03 NOTE — Assessment & Plan Note (Signed)
For lavaged your ears today. Wax was cleared.  Had to use currette to remove large piece from left side.

## 2014-03-03 NOTE — Progress Notes (Signed)
Pre visit review using our clinic review tool, if applicable. No additional management support is needed unless otherwise documented below in the visit note. 

## 2014-03-03 NOTE — Assessment & Plan Note (Signed)
For your anxiety, I will prescribe limited number of xanax until uds come back. Then if normal will have you come in for rx with refills.

## 2014-03-03 NOTE — Telephone Encounter (Signed)
Decrease the dose to 10 mg daily and see if he can tolerate that.

## 2014-03-03 NOTE — Patient Instructions (Addendum)
For your anxiety, I will prescribe limited number of xanax until uds come back. Then if normal will have you come in for rx with refills.   For lavaged your ears today. Wax was cleared.  Had to use currette to remove large piece from left side.  Follow up in 3-4 months or as needed.

## 2014-03-04 NOTE — Telephone Encounter (Signed)
Left message on machine for pt to contact the office.   

## 2014-03-11 ENCOUNTER — Encounter (HOSPITAL_COMMUNITY): Payer: Self-pay | Admitting: Cardiovascular Disease

## 2014-03-11 ENCOUNTER — Telehealth: Payer: Self-pay

## 2014-03-11 NOTE — Telephone Encounter (Signed)
Medication faxed to [facility], received UDS results, low risk per Dr. Larose Kells.

## 2014-03-11 NOTE — Telephone Encounter (Signed)
UDS: 03/03/2014  Positive for Alprazolam  Low risk per Dr. Larose Kells  03/11/2014

## 2014-03-24 ENCOUNTER — Encounter: Payer: Self-pay | Admitting: Internal Medicine

## 2014-03-29 ENCOUNTER — Telehealth: Payer: Self-pay | Admitting: Internal Medicine

## 2014-03-29 NOTE — Telephone Encounter (Signed)
Please call the patient, he is supposed to be taken a statin, arrange for labs: FLP, AST, ALT hyperlipidemia

## 2014-03-30 NOTE — Telephone Encounter (Signed)
Letter printed and mailed to Pt, informing him he is due for FLP, AST, ALT recheck (labs ordered).

## 2014-03-31 NOTE — Telephone Encounter (Signed)
PCP is following up on cholesterol.  Wilfrid Lund, CMA at 03/30/2014 8:45 AM     Status: Signed       Expand All Collapse All   Letter printed and mailed to Pt, informing him he is due for FLP, AST, ALT recheck (labs ordered).             Colon Branch, MD at 03/29/2014 7:12 PM     Status: Signed       Expand All Collapse All   Please call the patient, he is supposed to be taken a statin, arrange for labs: FLP, AST, ALT hyperlipidemia

## 2014-04-08 ENCOUNTER — Telehealth: Payer: Self-pay

## 2014-04-08 NOTE — Telephone Encounter (Signed)
Phone call to pt. to inquire about status of right leg claudication.  Pt. stated he felt he was "doing pretty good" with right leg, and doesn't feel he want to reschedule appt. w/ Dr. Trula Slade, at this time, to discuss right leg bypass. Stated he will call office to schedule appt., if he has any worsening of symptoms.

## 2014-04-14 ENCOUNTER — Telehealth: Payer: Self-pay | Admitting: Cardiovascular Disease

## 2014-04-14 DIAGNOSIS — E785 Hyperlipidemia, unspecified: Secondary | ICD-10-CM

## 2014-04-14 DIAGNOSIS — I739 Peripheral vascular disease, unspecified: Secondary | ICD-10-CM

## 2014-04-14 NOTE — Telephone Encounter (Signed)
Pt returning a call back to [employer], in regards to statin med and not being able to tolerate it.  Pt states he discussed this matter with [name] in early December about possibly wanting to switch to a different statin due to side effects.  Pt was prescribed Atorvastatin 40 mg po once daily by [name].  Pt states he has not taken this medication in weeks, due to the side effects.  Pt states that [name] was to advise on a dose change or another statin.  Pt states [name] called back in early December, but pt is now just returning her call.  Informed the pt that per [name] RN and [name] last telephone note, the recommendation was for the pt to have his Atorvastatin decreased to 10 mg po daily, and see if he can tolerate this.  Noted in the telephone encounter on Dec 1 that pts PCP [name] advised new orders as well.  Asked pt if he is agreeable with trying the decreased amount of atorvastatin.  Pt states he would like for [name] or [name] to call him back on return to office for clarification of statin regimen and suggested orders from his PCP.  Informed the pt they are both out of the office today but I will route this message to them for further review, recommendation, and follow-up thereafter.  Pt verbalized understanding and agrees with this plan.

## 2014-04-14 NOTE — Telephone Encounter (Signed)
New Msg          Pt calling, would like a call back in regards to medication, no details given.

## 2014-04-15 MED ORDER — ATORVASTATIN CALCIUM 10 MG PO TABS: 10.0000 mg | ORAL_TABLET | Freq: Every evening | ORAL | Status: DC

## 2014-04-15 NOTE — Telephone Encounter (Signed)
I spoke with the pt and he will try Atorvastatin 10mg  daily.  Rx sent to the pharmacy and pt scheduled for repeat labs 05/20/14.

## 2014-04-15 NOTE — Telephone Encounter (Signed)
I reviewed the notes. I recommend that he tries Atorvastatin 10 mg once daily and check fasting lipid and liver profile in 4-6 weeks.

## 2014-04-20 ENCOUNTER — Telehealth: Payer: Self-pay

## 2014-04-20 MED ORDER — ALPRAZOLAM 0.5 MG PO TABS: 0.5000 mg | ORAL_TABLET | Freq: Two times a day (BID) | ORAL | Status: DC | PRN

## 2014-04-20 NOTE — Telephone Encounter (Signed)
Pt is requesting refill on Alprazolam.  Last OV: 11/18/2013 Last Fill: 03/03/2014 # 20 0RF UDS: 03/03/2014 Low risk  Please advise.

## 2014-04-20 NOTE — Telephone Encounter (Signed)
[facility]

## 2014-04-20 NOTE — Telephone Encounter (Signed)
done

## 2014-05-20 ENCOUNTER — Other Ambulatory Visit (INDEPENDENT_AMBULATORY_CARE_PROVIDER_SITE_OTHER): Payer: BLUE CROSS/BLUE SHIELD | Admitting: *Deleted

## 2014-05-20 DIAGNOSIS — I739 Peripheral vascular disease, unspecified: Secondary | ICD-10-CM

## 2014-05-20 DIAGNOSIS — E785 Hyperlipidemia, unspecified: Secondary | ICD-10-CM

## 2014-05-20 LAB — HEPATIC FUNCTION PANEL
ALT: 67 U/L — ABNORMAL HIGH (ref 0–53)
AST: 39 U/L — ABNORMAL HIGH (ref 0–37)
Albumin: 4 g/dL (ref 3.5–5.2)
Alkaline Phosphatase: 119 U/L — ABNORMAL HIGH (ref 39–117)
Bilirubin, Direct: 0.1 mg/dL (ref 0.0–0.3)
Total Bilirubin: 0.5 mg/dL (ref 0.2–1.2)
Total Protein: 6.7 g/dL (ref 6.0–8.3)

## 2014-05-20 LAB — LIPID PANEL
Cholesterol: 147 mg/dL (ref 0–200)
HDL: 43.2 mg/dL (ref >39.00)
LDL Cholesterol: 88 mg/dL (ref 0–99)
NonHDL: 103.8
Total CHOL/HDL Ratio: 3
Triglycerides: 77 mg/dL (ref 0.0–149.0)
VLDL: 15.4 mg/dL (ref 0.0–40.0)

## 2014-05-26 ENCOUNTER — Other Ambulatory Visit: Payer: Self-pay | Admitting: Internal Medicine

## 2014-05-28 ENCOUNTER — Other Ambulatory Visit: Payer: Self-pay | Admitting: Internal Medicine

## 2014-05-31 ENCOUNTER — Encounter: Payer: Self-pay | Admitting: Gastroenterology

## 2014-05-31 ENCOUNTER — Ambulatory Visit (INDEPENDENT_AMBULATORY_CARE_PROVIDER_SITE_OTHER): Payer: BLUE CROSS/BLUE SHIELD | Admitting: Gastroenterology

## 2014-05-31 VITALS — BP 140/90 | HR 100 | Ht 68.5 in | Wt 177.5 lb

## 2014-05-31 DIAGNOSIS — K2270 Barrett's esophagus without dysplasia: Secondary | ICD-10-CM

## 2014-05-31 DIAGNOSIS — Z86010 Personal history of colonic polyps: Secondary | ICD-10-CM

## 2014-05-31 DIAGNOSIS — K921 Melena: Secondary | ICD-10-CM

## 2014-05-31 DIAGNOSIS — Z860101 Personal history of adenomatous and serrated colon polyps: Secondary | ICD-10-CM

## 2014-05-31 DIAGNOSIS — K219 Gastro-esophageal reflux disease without esophagitis: Secondary | ICD-10-CM

## 2014-05-31 MED ORDER — PEG-KCL-NACL-NASULF-NA ASC-C 100 G PO SOLR: 1.0000 | Freq: Once | ORAL | Status: DC

## 2014-05-31 MED ORDER — METOCLOPRAMIDE HCL 10 MG PO TABS: 10.0000 mg | ORAL_TABLET | Freq: Every day | ORAL | Status: DC

## 2014-05-31 MED ORDER — RANITIDINE HCL 300 MG PO CAPS: 300.0000 mg | ORAL_CAPSULE | Freq: Every evening | ORAL | Status: DC

## 2014-05-31 NOTE — Patient Instructions (Signed)
We have sent the following medications to your pharmacy for you to pick up at your convenience:ranitidine and reglan.  Raise the head of your bed with 4 " bed blocks.  You have been scheduled for a colonoscopy. Please follow written instructions given to you at your visit today.  Please pick up your prep supplies at the pharmacy within the next 1-3 days. If you use inhalers (even only as needed), please bring them with you on the day of your procedure. Your physician has requested that you go to www.startemmi.com and enter the access code given to you at your visit today. This web site gives a general overview about your procedure. However, you should still follow specific instructions given to you by our office regarding your preparation for the procedure.  Thank you for choosing me and [facility].  Pricilla Riffle. Dagoberto Ligas., MD., Marval Regal

## 2014-05-31 NOTE — Progress Notes (Signed)
History of Present Illness: This is a 59 year old male who relates worsening problems with reflux symptoms over the past 2 months. Has tried increasing his ranitidine from 150-300 mg at bedtime without much benefit. He notes about a 10 pound weight gain over the past several months. In addition he removed his head of bed elevation which had helped his reflux symptoms in the past. He underwent upper endoscopy in August 2014 which showed a short segment Barrett's, biopsies did not reveal revealed Barrett's. Barrett's has been noted on prior biopsies. He had 2 episodes of bright red blood per rectum that spontaneously cleared. Hemorrhoids were not noted on prior colonoscopy by Dr. Sharlett Iles. Denies weight loss, abdominal pain, constipation, diarrhea, change in stool caliber, melena, nausea, vomiting, dysphagia, chest pain.  No Known Allergies Outpatient Prescriptions Prior to Visit  Medication Sig Dispense Refill  . ALPRAZolam (XANAX) 0.5 MG tablet Take 1 tablet (0.5 mg total) by mouth 2 (two) times daily as needed for sleep or anxiety. 60 tablet 4  . aspirin EC 81 MG tablet Take 162 mg by mouth daily.     Marland Kitchen atorvastatin (LIPITOR) 10 MG tablet Take 1 tablet (10 mg total) by mouth every evening. 30 tablet 3  . Coenzyme Q10 10 MG capsule Take 10 mg by mouth daily.    Marland Kitchen dexlansoprazole (DEXILANT) 60 MG capsule Take 1 capsule (60 mg total) by mouth daily. 30 capsule 11  . Magnesium 500 MG CAPS Take 1 capsule by mouth at bedtime.      . psyllium (METAMUCIL) 58.6 % powder Take 1 packet by mouth at bedtime.     . ranitidine (ZANTAC) 150 MG tablet TAKE 1 TABLET BY MOUTH AT BEDTIME (Patient taking differently: TAKE 2 TABLETS BY MOUTH AT BEDTIME) 30 tablet 6  . ranitidine (ZANTAC) 150 MG tablet Take 150 mg by mouth at bedtime.     No facility-administered medications prior to visit.   Past Medical History  Diagnosis Date  . Personal history of colonic polyps     tublar adenomas 2008 & hyerplastic 2011    . Anxiety   . History of diverticulitis of colon   . Esophageal reflux   . Barrett's esophagus   . Hearing loss   . Abnormal LFTs     2008 neg. hep. serology, (-) ANA, ceruloplasmin, etc.;  liver biopsy showed  minimal active inflammation  . Hiatal hernia    Past Surgical History  Procedure Laterality Date  . Liver biopsy      minimal active inflammation  . Laparoscopically-assisted sigmoid colectomy  11/10/2007  . Abdominal aortagram N/A 11/25/2013    Procedure: ABDOMINAL Maxcine Ham;  Surgeon: Wellington Hampshire, MD;  Location: [facility] CATH LAB;  Service: Cardiovascular;  Laterality: N/A;   History   Social History  . Marital Status: Married    Spouse Name: N/A  . Number of Children: 2  . Years of Education: N/A   Occupational History  . Therapist, music, [employer]    Social History Main Topics  . Smoking status: Current Every Day Smoker -- 0.25 packs/day for 30 years    Types: Cigarettes  . Smokeless tobacco: Never Used     Comment: 3 cigarettes daily  . Alcohol Use: No     Comment: 2 glasses wine glasses daily  . Drug Use: No  . Sexual Activity: Not on file   Other Topics Concern  . None   Social History Narrative   Born in [place]   Married, 2 step  sons   Family History  Problem Relation Age of Onset  . Prostate cancer Father 26  . Esophageal cancer Father   . Coronary artery disease Father     dx age 76, CABG  . Heart disease Father   . Colon cancer Neg Hx   . Heart disease Paternal Uncle     x 2  . Diabetes      Physical Exam: General: Well developed , well nourished, no acute distress Head: Normocephalic and atraumatic Eyes:  sclerae anicteric, EOMI Ears: Normal auditory acuity Mouth: No deformity or lesions Lungs: Clear throughout to auscultation Heart: Regular rate and rhythm; no murmurs, rubs or bruits Abdomen: Soft, non tender and non distended. No masses, hepatosplenomegaly or hernias noted. Normal Bowel sounds Rectal: no lesions, heme  neg brown stool Musculoskeletal: Symmetrical with no gross deformities  Pulses:  Normal pulses noted Extremities: No clubbing, cyanosis, edema or deformities noted Neurological: Alert oriented x 4, grossly nonfocal Psychological:  Alert and cooperative. Normal mood and affect  Assessment and Recommendations:  1. GERD and Barrett's. Increase ranitidine to 300 mg hs. Add Reglan 10 mg hs. Resume HOB elevation. REV in 2 months.  2. Hematochezia, personal history of adenomatous colon polyps. Suspected hemorrhoidal bleeding. R/O other causes. The risks (including bleeding, perforation, infection, missed lesions, medication reactions and possible hospitalization or surgery if complications occur), benefits, and alternatives to colonoscopy with possible biopsy and possible polypectomy were discussed with the patient and they consent to proceed.

## 2014-06-07 ENCOUNTER — Encounter: Payer: BLUE CROSS/BLUE SHIELD | Admitting: Gastroenterology

## 2014-07-07 ENCOUNTER — Other Ambulatory Visit: Payer: Self-pay

## 2014-07-26 ENCOUNTER — Telehealth: Payer: Self-pay | Admitting: Internal Medicine

## 2014-07-26 NOTE — Telephone Encounter (Signed)
previsit letter for annual exam mailed  °

## 2014-07-29 ENCOUNTER — Other Ambulatory Visit: Payer: Self-pay

## 2014-08-10 ENCOUNTER — Telehealth: Payer: Self-pay | Admitting: Gastroenterology

## 2014-08-10 NOTE — Telephone Encounter (Signed)
Patient c/o change in bowel habits.  New onset constipation despite OTC stool softeners and laxatives.  He reports a history of diverticulitis and a colon resection 8 years ago for the same.  He is not having pain.  He will come in and see Nicoletta Ba PA tomorrow at 2:00

## 2014-08-11 ENCOUNTER — Other Ambulatory Visit (INDEPENDENT_AMBULATORY_CARE_PROVIDER_SITE_OTHER): Payer: BLUE CROSS/BLUE SHIELD

## 2014-08-11 ENCOUNTER — Ambulatory Visit (INDEPENDENT_AMBULATORY_CARE_PROVIDER_SITE_OTHER): Payer: BLUE CROSS/BLUE SHIELD | Admitting: Physician Assistant

## 2014-08-11 ENCOUNTER — Encounter: Payer: Self-pay | Admitting: Physician Assistant

## 2014-08-11 VITALS — BP 136/80 | HR 96 | Ht 68.5 in | Wt 177.1 lb

## 2014-08-11 DIAGNOSIS — Z86010 Personal history of colonic polyps: Secondary | ICD-10-CM

## 2014-08-11 DIAGNOSIS — K5900 Constipation, unspecified: Secondary | ICD-10-CM

## 2014-08-11 DIAGNOSIS — R140 Abdominal distension (gaseous): Secondary | ICD-10-CM

## 2014-08-11 DIAGNOSIS — Z8719 Personal history of other diseases of the digestive system: Secondary | ICD-10-CM | POA: Diagnosis not present

## 2014-08-11 LAB — CBC WITH DIFFERENTIAL/PLATELET
Basophils Absolute: 0 10*3/uL (ref 0.0–0.1)
Basophils Relative: 0.5 % (ref 0.0–3.0)
Eosinophils Absolute: 0.1 10*3/uL (ref 0.0–0.7)
Eosinophils Relative: 1.8 % (ref 0.0–5.0)
HCT: 48 % (ref 39.0–52.0)
Hemoglobin: 16.6 g/dL (ref 13.0–17.0)
Lymphocytes Relative: 24.8 % (ref 12.0–46.0)
Lymphs Abs: 1.8 10*3/uL (ref 0.7–4.0)
MCHC: 34.7 g/dL (ref 30.0–36.0)
MCV: 89 fl (ref 78.0–100.0)
Monocytes Absolute: 1.1 10*3/uL — ABNORMAL HIGH (ref 0.1–1.0)
Monocytes Relative: 15.2 % — ABNORMAL HIGH (ref 3.0–12.0)
Neutro Abs: 4.3 10*3/uL (ref 1.4–7.7)
Neutrophils Relative %: 57.7 % (ref 43.0–77.0)
Platelets: 341 10*3/uL (ref 150.0–400.0)
RBC: 5.39 Mil/uL (ref 4.22–5.81)
RDW: 13.5 % (ref 11.5–15.5)
WBC: 7.4 10*3/uL (ref 4.0–10.5)

## 2014-08-11 LAB — BASIC METABOLIC PANEL
BUN: 13 mg/dL (ref 6–23)
CO2: 32 mEq/L (ref 19–32)
Calcium: 9.8 mg/dL (ref 8.4–10.5)
Chloride: 102 mEq/L (ref 96–112)
Creatinine, Ser: 0.94 mg/dL (ref 0.40–1.50)
GFR: 87.13 mL/min (ref >60.00)
Glucose, Bld: 119 mg/dL — ABNORMAL HIGH (ref 70–99)
Potassium: 4.6 mEq/L (ref 3.5–5.1)
Sodium: 139 mEq/L (ref 135–145)

## 2014-08-11 NOTE — Progress Notes (Signed)
Reviewed and agree with management plan.  Fain Francis T. Baylen Buckner, MD FACG 

## 2014-08-11 NOTE — Patient Instructions (Signed)
Please go to the basement level to have your labs drawn.  Take Miralax 2-3 does daily until you start having bowel movements.   You have been scheduled for a CT scan of the abdomen and pelvis at  ([address]---this is in the same building as Press [facility]).   You are scheduled on  08-12-2014 at 3:00 Pm . You should arrive at 2:45 PM  prior to your appointment time for registration. Please follow the written instructions below on the day of your exam:  WARNING: IF YOU ARE ALLERGIC TO IODINE/X-RAY DYE, PLEASE NOTIFY RADIOLOGY IMMEDIATELY AT [phone]! YOU WILL BE GIVEN A 13 HOUR PREMEDICATION PREP.  1) Do not eat or drink anything after  11:00 am  (4 hours prior to your test) 2) You have been given 2 bottles of oral contrast to drink. The solution may taste   better if refrigerated, but do NOT add ice or any other liquid to this solution. Shake well before drinking.    Drink 1 bottle of contrast @ 1:00 Pm  (2 hours prior to your exam)  Drink 1 bottle of contrast @ 2:00 Pm  (1 hour prior to your exam)  You may take any medications as prescribed with a small amount of water except for the following: Metformin, Glucophage, Glucovance, Avandamet, Riomet, Fortamet, Actoplus Met, Janumet, Glumetza or Metaglip. The above medications must be held the day of the exam AND 48 hours after the exam.  The purpose of you drinking the oral contrast is to aid in the visualization of your intestinal tract. The contrast solution may cause some diarrhea. Before your exam is started, you will be given a small amount of fluid to drink. Depending on your individual set of symptoms, you may also receive an intravenous injection of x-ray contrast/dye. Plan on being at [facility] for 30 minutes or long, depending on the type of exam you are having performed.  If you have any questions regarding your exam or if you need to reschedule, you may call the CT department at [phone]  between the hours of 8:00 am and 5:00 pm, Monday-Friday.  ________________________________________________________________________

## 2014-08-11 NOTE — Progress Notes (Signed)
Patient ID: Ryan Smith, male   DOB: 01-23-1956, 59 y.o.   MRN: [id number]   Subjective:    Patient ID: Ryan Smith, male    DOB: December 17, 1955, 59 y.o.   MRN: [id number]  HPI Ryan Smith  is a pleasant 59 year old white male known to Dr. Fuller Smith. He has history of chronic GERD and Barrett's esophagus. Also with history of adenomatous Ryan polyps and diverticular disease. A sigmoid colectomy in 2009 for diverticular disease. Comes in today because of concerns for abrupt change in his bowel habits with constipation onset about 6 days ago. He started taking stool softeners and MiraLAX but has had very little results. Says he is generally very regular and takes Metamucil on a daily basis. He has not had any real abdominal pain but feels bloated and distended and is worried. No fever or chills. He states that he had a couple of previous episodes of diverticulitis with primary symptom of constipation and not much abdominal pain. Line Last colonoscopy was done and 2011 per Dr. Sharlett Smith with finding of a widely patent low anastomosis with scattered left Ryan tics, and one hyperplastic polyp was removed. He was recommended to have follow-up in 5 years. Last EGD 2014 finding of a short segment Barrett's no dysplasia he is to have 3 year interval follow-up. Patient had also been seen by Dr. Fuller Smith in February 2016 after a episode of small volume hematochezia. He was scheduled for colonoscopy but then canceled that and did not reschedule.  Review of Systems Pertinent positive and negative review of systems were noted in the above HPI section.  All other review of systems was otherwise negative.  Outpatient Encounter Prescriptions as of 08/11/2014  Medication Sig  . ALPRAZolam (XANAX) 0.5 MG tablet Take 1 tablet (0.5 mg total) by mouth 2 (two) times daily as needed for sleep or anxiety.  Marland Kitchen aspirin EC 81 MG tablet Take 162 mg by mouth daily.   Marland Kitchen atorvastatin (LIPITOR) 10 MG tablet Take 1 tablet (10 mg total) by mouth  every evening.  . Coenzyme Q10 10 MG capsule Take 10 mg by mouth daily.  Marland Kitchen dexlansoprazole (DEXILANT) 60 MG capsule Take 1 capsule (60 mg total) by mouth daily.  . Magnesium 500 MG CAPS Take 1 capsule by mouth at bedtime.    . peg 3350 powder (MOVIPREP) 100 G SOLR Take 1 kit (200 g total) by mouth once.  . psyllium (METAMUCIL) 58.6 % powder Take 1 packet by mouth at bedtime.   . ranitidine (ZANTAC) 300 MG capsule Take 1 capsule (300 mg total) by mouth every evening.  . [DISCONTINUED] metoCLOPramide (REGLAN) 10 MG tablet Take 1 tablet (10 mg total) by mouth at bedtime.   No facility-administered encounter medications on file as of 08/11/2014.   No Known Allergies Patient Active Problem List   Diagnosis Date Noted  . Cerumen impaction 03/03/2014  . Preop cardiovascular exam 01/19/2014  . PVD (peripheral vascular disease) with claudication 01/04/2014  . Atherosclerosis of native arteries of the extremities with intermittent claudication 11/30/2013  . Tobacco use 11/24/2013  . Claudication 11/18/2013  . Hyperlipidemia 10/03/2011  . Annual physical exam 04/10/2011  . GERD (gastroesophageal reflux disease) 08/15/2010  . Abnormal LFTs (liver function tests) 08/15/2010  . History of Ryan polyps 08/15/2010  . Diverticulosis 08/15/2010  . BARRETTS ESOPHAGUS 12/21/2009  . HIATAL HERNIA WITH REFLUX 12/21/2009  . OTHER SPECIFIED DISORDERS OF LIVER 11/15/2009  . ANXIETY 08/30/2006  . COMMON MIGRAINE 08/30/2006  . LOSS, HEARING NOS 08/30/2006  .  INSOMNIA 08/30/2006  . DIVERTICULOSIS OF Ryan 05/21/2006   History   Social History  . Marital Status: Married    Spouse Name: N/A  . Number of Children: 2  . Years of Education: N/A   Occupational History  . Therapist, music, [employer]    Social History Main Topics  . Smoking status: Current Every Day Smoker -- 0.25 packs/day for 30 years    Types: Cigarettes  . Smokeless tobacco: Never Used     Comment: 3 cigarettes daily  . Alcohol  Use: No     Comment: 2 glasses wine glasses daily  . Drug Use: No  . Sexual Activity: Not on file   Other Topics Concern  . Not on file   Social History Narrative   Born in [place]   Married, 2 step sons    [name] family history includes Coronary artery disease in his father; Diabetes in an other family member; Esophageal cancer in his father; Heart disease in his father and paternal uncle; Prostate cancer (age of onset: 51) in his father. There is no history of Ryan cancer.      Objective:    Filed Vitals:   08/11/14 1400  BP: 136/80  Pulse: 96    Physical Exam    well-developed white male in no acute distress, pleasant blood pressure 136/80 pulse 96 height 5 foot 8 weight 177. HEENT; nontraumatic normocephalic EOMI PERRLA sclera anicteric, Supple; no JVD, Cardiovascular; regular rate and rhythm with S1-S2 no murmur or gallop, Pulmonary; clear bilaterally, Abdomen ;soft ,no visible distention, bowel sounds are present , very minimal tenderness in the deep left lower quadrant there is no guarding or rebound no palpable mass or hepatosplenomegaly, Rectal ;exam not done, Ext;  no clubbing cyanosis or edema skin warm and dry, Psych; mood and affect appropriate       Assessment & Smith:   #1 59 yo male with  Hx of diverticulitis and prior sigmoid resection 2009  with abrupt onset of constipation 6 days ago -persistent -and associated with bloating/distention R/O acute diverticulitis R/O anastomotic stricture, partial obstruction R/o occult lesion #2 Ryan Smith  #3 barretts esophagus- due for follow up EGD 2017 #4 hx of adenomatous Ryan polyps #5 PVD  Smith; CBC, BMET Schedule for Ct abd/pelvis  Increase Miralax to 3 doses daily until having Bm's then daily If Ct negative may give prep to purge bowel  Schedule for Colonoscopy with Dr Ryan Smith pending  Ct review.    Ryan Genia Harold PA-C 08/11/2014   Cc: Ryan Branch, MD

## 2014-08-12 ENCOUNTER — Ambulatory Visit (INDEPENDENT_AMBULATORY_CARE_PROVIDER_SITE_OTHER)
Admission: RE | Admit: 2014-08-12 | Discharge: 2014-08-12 | Disposition: A | Discharge status: Home / Self Care | Payer: BLUE CROSS/BLUE SHIELD | Source: Ambulatory Visit | Attending: Physician Assistant | Admitting: Physician Assistant

## 2014-08-12 DIAGNOSIS — R140 Abdominal distension (gaseous): Secondary | ICD-10-CM

## 2014-08-12 DIAGNOSIS — Z8719 Personal history of other diseases of the digestive system: Secondary | ICD-10-CM | POA: Diagnosis not present

## 2014-08-12 DIAGNOSIS — K5900 Constipation, unspecified: Secondary | ICD-10-CM

## 2014-08-12 MED ORDER — IOHEXOL 300 MG/ML  SOLN
100.0000 mL | Freq: Once | INTRAMUSCULAR | Status: AC | PRN
  Administered 2014-08-12: 100 mL via INTRAVENOUS

## 2014-08-13 ENCOUNTER — Telehealth: Payer: Self-pay | Admitting: Gastroenterology

## 2014-08-13 NOTE — Telephone Encounter (Signed)
CT from 08/12/14 has not been reviewed by provider. If can be viewed on "[facility]". I do not have the recommendations from Amy. Amy is out of the office today. Call to the patient. No answer. Left message for a call back.

## 2014-08-13 NOTE — Telephone Encounter (Signed)
Spoke with the patient. Advised there is no bowel obstruction.

## 2014-08-16 ENCOUNTER — Ambulatory Visit (INDEPENDENT_AMBULATORY_CARE_PROVIDER_SITE_OTHER): Payer: BLUE CROSS/BLUE SHIELD | Admitting: Internal Medicine

## 2014-08-16 ENCOUNTER — Encounter: Payer: Self-pay | Admitting: Internal Medicine

## 2014-08-16 VITALS — BP 128/84 | HR 71 | Temp 97.3°F | Ht 69.0 in | Wt 172.2 lb

## 2014-08-16 DIAGNOSIS — Z23 Encounter for immunization: Secondary | ICD-10-CM

## 2014-08-16 DIAGNOSIS — R972 Elevated prostate specific antigen [PSA]: Secondary | ICD-10-CM | POA: Diagnosis not present

## 2014-08-16 DIAGNOSIS — Z125 Encounter for screening for malignant neoplasm of prostate: Secondary | ICD-10-CM

## 2014-08-16 DIAGNOSIS — I739 Peripheral vascular disease, unspecified: Secondary | ICD-10-CM

## 2014-08-16 DIAGNOSIS — Z0000 Encounter for general adult medical examination without abnormal findings: Secondary | ICD-10-CM

## 2014-08-16 DIAGNOSIS — E785 Hyperlipidemia, unspecified: Secondary | ICD-10-CM

## 2014-08-16 MED ORDER — DEXLANSOPRAZOLE 60 MG PO CPDR: 60.0000 mg | DELAYED_RELEASE_CAPSULE | Freq: Every day | ORAL | Status: DC

## 2014-08-16 MED ORDER — ATORVASTATIN CALCIUM 10 MG PO TABS: 10.0000 mg | ORAL_TABLET | Freq: Every evening | ORAL | Status: DC

## 2014-08-16 NOTE — Assessment & Plan Note (Addendum)
Td  2007  zostavax-- discussed before , decided to wait Pneumonia shot today  last colonoscopy 10/2009, polyp, due for a cscope 2016 Prostate cancer screening: Declined a DRE, check a PSA counseled diet, exercise   Labs  Counseled about tobacco; has make some progress from half pack a day to 3 cigarettes daily but is  not ready to quit, not related to take medication, has never tried the patch, not interested.

## 2014-08-16 NOTE — Progress Notes (Signed)
Subjective:    Patient ID: Ryan Smith, male    DOB: 17-May-1955, 59 y.o.   MRN: [id number]  DOS:  08/16/2014 Type of visit - description : Complete physical exam Interval history: Since the last time I saw him, he was diagnosed with peripheral vascular disease, extensive chart review.   Review of Systems  Constitutional: No fever, chills. No unexplained wt changes. No unusual sweats HEENT: No dental problems, ear discharge, facial swelling, voice changes. Right ear feels is slightly congested No eye discharge, redness or intolerance to light Respiratory:  Having cough for 3 days with occasional sputum production, usually clear. No hemoptysis. No wheezing or difficulty breathing.   Cardiovascular: No CP, leg swelling or palpitations GI: no nausea, vomiting, diarrhea or abdominal pain.  No blood in the stools. No dysphagia   Endocrine: No polyphagia, polyuria or polydipsia GU: No dysuria, gross hematuria, difficulty urinating. No urinary urgency or frequency. Musculoskeletal: No joint swellings or unusual aches or pains Skin: No change in the color of the skin, palor or rash Allergic, immunologic: No environmental allergies or food allergies Neurological: No dizziness or syncope. No headaches. No diplopia, slurred speech, motor deficits, facial numbness Hematological: No enlarged lymph nodes, easy bruising or bleeding Psychiatry: No suicidal ideas, hallucinations, behavior problems or confusion. No unusual/severe anxiety or depression.    Past Medical History  Diagnosis Date  . Personal history of colonic polyps     tublar adenomas 2008 & hyerplastic 2011  . Anxiety   . History of diverticulitis of colon   . Esophageal reflux   . Barrett's esophagus   . Hearing loss   . Abnormal LFTs     2008 neg. hep. serology, (-) ANA, ceruloplasmin, etc.;  liver biopsy showed  minimal active inflammation  . Hiatal hernia   . PVD (peripheral vascular disease)     Past Surgical History   Procedure Laterality Date  . Liver biopsy      minimal active inflammation  . Laparoscopically-assisted sigmoid colectomy  11/10/2007  . Abdominal aortagram N/A 11/25/2013    Procedure: ABDOMINAL Maxcine Ham;  Surgeon: Wellington Hampshire, MD;  Location: [facility] CATH LAB;  Service: Cardiovascular;  Laterality: N/A;    History   Social History  . Marital Status: Married    Spouse Name: N/A  . Number of Children: 2  . Years of Education: N/A   Occupational History  . Therapist, music, [employer]    Social History Main Topics  . Smoking status: Current Every Day Smoker -- 0.25 packs/day for 30 years    Types: Cigarettes  . Smokeless tobacco: Never Used     Comment: 3 cigarettes daily  . Alcohol Use: No     Comment: 2-3 glasses wine glasses daily  . Drug Use: No  . Sexual Activity: Not on file   Other Topics Concern  . Not on file   Social History Narrative   Born in [place]   Married, 2 step sons (adults)     Family History  Problem Relation Age of Onset  . Prostate cancer Father 74  . Esophageal cancer Father   . Coronary artery disease Father     dx age 34, CABG  . Heart disease Father   . Colon cancer Neg Hx   . Heart disease Paternal Uncle     x 2  . Diabetes Neg Hx        Medication List       This list is accurate as of: 08/16/14  5:46 PM.  Always use your most recent med list.               ALPRAZolam 0.5 MG tablet  Commonly known as:  XANAX  Take 1 tablet (0.5 mg total) by mouth 2 (two) times daily as needed for sleep or anxiety.     aspirin EC 81 MG tablet  Take 162 mg by mouth daily.     atorvastatin 10 MG tablet  Commonly known as:  LIPITOR  Take 1 tablet (10 mg total) by mouth every evening.     Coenzyme Q10 10 MG capsule  Take 10 mg by mouth daily.     dexlansoprazole 60 MG capsule  Commonly known as:  DEXILANT  Take 1 capsule (60 mg total) by mouth daily.     Magnesium 500 MG Caps  Take 1 capsule by mouth at bedtime.     peg  3350 powder 100 G Solr  Commonly known as:  MOVIPREP  Take 1 kit (200 g total) by mouth once.     psyllium 58.6 % powder  Commonly known as:  METAMUCIL  Take 1 packet by mouth at bedtime.     ranitidine 300 MG capsule  Commonly known as:  ZANTAC  Take 1 capsule (300 mg total) by mouth every evening.           Objective:   Physical Exam BP 128/84 mmHg  Pulse 71  Temp(Src) 97.3 F (36.3 C) (Oral)  Ht '5\' 9"'  (1.753 m)  Wt 172 lb 4 oz (78.132 kg)  BMI 25.43 kg/m2  SpO2 98%  General:   Well developed, well nourished . NAD.  Neck:  Full range of motion. Supple. No  thyromegaly   HEENT:  Normocephalic . Face symmetric, atraumatic. Nose slightly congested, sinuses not TTP TM right: Bulge, no red, no discharge TM left obscured by wax Oropharynx normal Lungs:  CTA B Normal respiratory effort, no intercostal retractions, no accessory muscle use. Heart: RRR,  no murmur.  No pretibial edema bilaterally  Abdomen:  Not distended, soft, non-tender. No rebound or rigidity. No mass,organomegaly Skin: Exposed areas without rash. Not pale. Not jaundice DRE-- declined  Neurologic:  alert & oriented X3.  Speech normal, gait appropriate for age and unassisted Strength symmetric and appropriate for age.  Psych: Cognition and judgment appear intact.  Cooperative with normal attention span and concentration.  Behavior appropriate. No anxious or depressed appearing.       Assessment & Plan:     Peripheral vascular disease, Was seen w/ claudication 10-2013, ABIs showed obstruction, saw cardiology, had a aortogram w/ [name]: Subacute thrombotic occlusion of the right popliteal artery and atherosclerotic disease., was referred to surgery. They rx further  testing: CT chest show mild atherosclerosis disease of the aorta, coronary artherosclerosis Had a MRI of the right leg, see report. Was recommended a bypass but so far has decided not to proceed. No benefit from  cilostazol Stress 12-2013: Low risk  Hyperlipidemia,  started on statins, last FLP 2-16  per cardiology-- improved. LFTs were were elevated but not far from baseline.  Plan: Continue statins  History of Barrett's,   per GI  Mild hyperglycemia, check A1c URI, complaining of mild cough for few days, exam is benign, call if not better.  RTC 6 months-- declined, states will come back in one year due to insurance constraints

## 2014-08-16 NOTE — Patient Instructions (Signed)
Get your blood work before you leave     Come back to the office in 6 months   for a routine check up

## 2014-08-16 NOTE — Progress Notes (Signed)
Pre visit review using our clinic review tool, if applicable. No additional management support is needed unless otherwise documented below in the visit note. 

## 2014-08-17 LAB — PSA: PSA: 5.11 ng/mL — ABNORMAL HIGH (ref 0.10–4.00)

## 2014-08-17 LAB — HEMOGLOBIN A1C: Hgb A1c MFr Bld: 5.6 % (ref 4.6–6.5)

## 2014-08-18 NOTE — Addendum Note (Signed)
Addended by: Wilfrid Lund on: 08/18/2014 09:10 AM   Modules accepted: Orders

## 2014-08-21 ENCOUNTER — Other Ambulatory Visit: Payer: Self-pay | Admitting: Gastroenterology

## 2014-09-27 ENCOUNTER — Encounter: Payer: Self-pay | Admitting: Gastroenterology

## 2014-10-26 ENCOUNTER — Other Ambulatory Visit: Payer: Self-pay | Admitting: Internal Medicine

## 2014-10-27 NOTE — Telephone Encounter (Signed)
See rx. 

## 2014-10-27 NOTE — Telephone Encounter (Signed)
Rx faxed to [facility].  

## 2014-10-27 NOTE — Telephone Encounter (Signed)
Pt is requesting refill on Alprazolam.  Last OV: 08/16/2014 Last Fill: 04/20/2014 #60 4RF UDS: 03/03/2014 Low risk   Please advise.

## 2014-12-02 ENCOUNTER — Other Ambulatory Visit: Payer: Self-pay | Admitting: Family

## 2014-12-03 NOTE — Telephone Encounter (Signed)
Okay #60 and 3 refills 

## 2014-12-03 NOTE — Telephone Encounter (Signed)
Pt is requesting refill on Alprazolam.  Last OV: 08/16/2014 Last Fill: 10/27/2014 by Lenna Sciara #60 0RF UDS: 03/03/2014 Low risk  Please advise.

## 2014-12-03 NOTE — Telephone Encounter (Signed)
Rx printed, awaiting MD signature.  

## 2014-12-03 NOTE — Telephone Encounter (Signed)
Rx faxed to [facility].  

## 2015-02-28 ENCOUNTER — Telehealth: Payer: Self-pay | Admitting: Internal Medicine

## 2015-02-28 NOTE — Telephone Encounter (Signed)
Advise patient, last PSA was elevated, needs to  recheck. Please arrange.

## 2015-03-01 NOTE — Telephone Encounter (Signed)
Letter printed and mailed to Pt.  

## 2015-04-17 ENCOUNTER — Other Ambulatory Visit: Payer: Self-pay | Admitting: Internal Medicine

## 2015-04-18 NOTE — Telephone Encounter (Signed)
Rx printed, awaiting MD signature.  

## 2015-04-18 NOTE — Telephone Encounter (Signed)
Pt is requesting refill on Alprazolam.  Last OV: 08/16/2014, no future appt scheduled Last Fill: 12/03/2014 #60 and 3RF UDS: 03/03/2014 Low risk  Please advise.

## 2015-04-18 NOTE — Telephone Encounter (Signed)
Rx faxed to [facility].  

## 2015-04-18 NOTE — Telephone Encounter (Signed)
Ok 60 and 3 RF

## 2015-05-17 ENCOUNTER — Other Ambulatory Visit: Payer: Self-pay | Admitting: Gastroenterology

## 2015-05-19 ENCOUNTER — Other Ambulatory Visit: Payer: Self-pay | Admitting: Internal Medicine

## 2015-06-16 ENCOUNTER — Other Ambulatory Visit: Payer: Self-pay | Admitting: Internal Medicine

## 2015-07-17 ENCOUNTER — Other Ambulatory Visit: Payer: Self-pay | Admitting: Internal Medicine

## 2015-08-01 ENCOUNTER — Encounter: Payer: Self-pay | Admitting: Internal Medicine

## 2015-08-01 ENCOUNTER — Ambulatory Visit (INDEPENDENT_AMBULATORY_CARE_PROVIDER_SITE_OTHER): Payer: BLUE CROSS/BLUE SHIELD | Admitting: Internal Medicine

## 2015-08-01 VITALS — BP 134/88 | HR 63 | Temp 97.9°F | Resp 14 | Ht 68.5 in | Wt 186.4 lb

## 2015-08-01 DIAGNOSIS — R972 Elevated prostate specific antigen [PSA]: Secondary | ICD-10-CM | POA: Diagnosis not present

## 2015-08-01 DIAGNOSIS — E785 Hyperlipidemia, unspecified: Secondary | ICD-10-CM

## 2015-08-01 DIAGNOSIS — I739 Peripheral vascular disease, unspecified: Secondary | ICD-10-CM | POA: Diagnosis not present

## 2015-08-01 DIAGNOSIS — R7989 Other specified abnormal findings of blood chemistry: Secondary | ICD-10-CM

## 2015-08-01 DIAGNOSIS — Z09 Encounter for follow-up examination after completed treatment for conditions other than malignant neoplasm: Secondary | ICD-10-CM

## 2015-08-01 DIAGNOSIS — H6123 Impacted cerumen, bilateral: Secondary | ICD-10-CM

## 2015-08-01 DIAGNOSIS — R945 Abnormal results of liver function studies: Secondary | ICD-10-CM

## 2015-08-01 NOTE — Patient Instructions (Signed)
GO TO THE LAB :      Get the blood work      Next visit as scheduled.

## 2015-08-01 NOTE — Progress Notes (Signed)
Pre visit review using our clinic review tool, if applicable. No additional management support is needed unless otherwise documented below in the visit note. 

## 2015-08-01 NOTE — Progress Notes (Signed)
Subjective:    Patient ID: Ryan Smith, male    DOB: 1955-08-26, 60 y.o.   MRN: [id number]  DOS:  08/01/2015 Type of visit - description : Acute visit Interval history:  History of impaction on and off, symptoms started a few days ago with fullness of the ears L>R Good medication compliance.  Review of Systems Denies ear pain, ear discharge or recent rise Is developing R claudication.No symptoms at rest. Considering proceed with surgery.  Past Medical History  Diagnosis Date  . Personal history of colonic polyps     tublar adenomas 2008 & hyerplastic 2011  . Anxiety   . History of diverticulitis of colon   . Esophageal reflux   . Barrett's esophagus   . Hearing loss   . Abnormal LFTs     2008 neg. hep. serology, (-) ANA, ceruloplasmin, etc.;  liver biopsy showed  minimal active inflammation  . Hiatal hernia   . PVD (peripheral vascular disease) [facility])     Past Surgical History  Procedure Laterality Date  . Liver biopsy      minimal active inflammation  . Laparoscopically-assisted sigmoid colectomy  11/10/2007  . Abdominal aortagram N/A 11/25/2013    Procedure: ABDOMINAL Maxcine Ham;  Surgeon: Wellington Hampshire, MD;  Location: [facility] CATH LAB;  Service: Cardiovascular;  Laterality: N/A;    Social History   Social History  . Marital Status: Married    Spouse Name: N/A  . Number of Children: 2  . Years of Education: N/A   Occupational History  . Therapist, music, [employer]    Social History Main Topics  . Smoking status: Current Every Day Smoker -- 30 years    Types: Cigarettes  . Smokeless tobacco: Never Used     Comment: 3-4 cigarettes daily  . Alcohol Use: No     Comment: 2-3 glasses wine glasses daily  . Drug Use: No  . Sexual Activity: Not on file   Other Topics Concern  . Not on file   Social History Narrative   Born in [place]   Married, 2 step sons (adults)        Medication List       This list is accurate as of: 08/01/15  8:15 PM.   Always use your most recent med list.               ALPRAZolam 0.5 MG tablet  Commonly known as:  XANAX  Take 1 tablet (0.5 mg total) by mouth 2 (two) times daily as needed for anxiety.     aspirin EC 81 MG tablet  Take 162 mg by mouth daily.     atorvastatin 10 MG tablet  Commonly known as:  LIPITOR  Take 1 tablet (10 mg total) by mouth daily.     Coenzyme Q10 10 MG capsule  Take 10 mg by mouth daily.     dexlansoprazole 60 MG capsule  Commonly known as:  DEXILANT  Take 1 capsule (60 mg total) by mouth daily.     Magnesium 500 MG Caps  Take 1 capsule by mouth at bedtime.     peg 3350 powder 100 g Solr  Commonly known as:  MOVIPREP  Take 1 kit (200 g total) by mouth once.     psyllium 58.6 % powder  Commonly known as:  METAMUCIL  Take 1 packet by mouth at bedtime.     ranitidine 300 MG capsule  Commonly known as:  ZANTAC  TAKE ONE CAPSULE BY MOUTH EVERY  EVENING           Objective:   Physical Exam BP 134/88 mmHg  Pulse 63  Temp(Src) 97.9 F (36.6 C) (Oral)  Resp 14  Ht 5' 8.5" (1.74 m)  Wt 186 lb 6.4 oz (84.55 kg)  BMI 27.93 kg/m2  SpO2 97% General:   Well developed, well nourished . NAD.  HEENT:  Normocephalic . Face symmetric, atraumatic Ears: Abundant wax bilaterally Rectal:  External abnormalities: none. Normal sphincter tone. No rectal masses or tenderness.  Stool not foun  Prostate: Prostate gland firm and smooth, no enlargement, nodularity, tenderness, mass, asymmetry or induration.  Skin: Not pale. Not jaundice Neurologic:  alert & oriented X3.  Speech normal, gait appropriate for age and unassisted Psych--  Cognition and judgment appear intact.  Cooperative with normal attention span and concentration.  Behavior appropriate. No anxious or depressed appearing.      Assessment & Plan:   Assessment Anxiety PVD R leg, dx 2015 GI:  --Barrett's, GERD --Abnormal LFTs: 2008 neg. hep. serology, (-) ANA, ceruloplasmin, etc.;  liver  biopsy showed  minimal active inflammation Hearing loss  Plan: Cerumen impaction: partial removal of cerumen by me w/ a spoon, later completely cleared by my nurse w/ a lavage Elevated PSA: PSA was elevated last year,  declined DRE at that time, DRE (-) today , will recheck a PSA. Peripheral vascular disease: Having  claudication, plans to contact vascular surgery, declined a referral. High cholesterol: On Lipitor, check LFTs RTC 10-2015 as scheduled

## 2015-08-01 NOTE — Assessment & Plan Note (Signed)
Cerumen impaction: partial removal of cerumen by me w/ a spoon, later completely cleared by my nurse w/ a lavage Elevated PSA: PSA was elevated last year,  declined DRE at that time, DRE (-) today , will recheck a PSA. Peripheral vascular disease: Having  claudication, plans to contact vascular surgery, declined a referral. High cholesterol: On Lipitor, check LFTs RTC 10-2015 as scheduled

## 2015-08-02 LAB — HEPATIC FUNCTION PANEL
ALT: 43 U/L (ref 0–53)
AST: 29 U/L (ref 0–37)
Albumin: 4.4 g/dL (ref 3.5–5.2)
Alkaline Phosphatase: 82 U/L (ref 39–117)
Bilirubin, Direct: 0.1 mg/dL (ref 0.0–0.3)
Total Bilirubin: 0.5 mg/dL (ref 0.2–1.2)
Total Protein: 6.7 g/dL (ref 6.0–8.3)

## 2015-08-02 LAB — PSA: PSA: 4.26 ng/mL — ABNORMAL HIGH (ref 0.10–4.00)

## 2015-08-16 ENCOUNTER — Other Ambulatory Visit: Payer: Self-pay | Admitting: Internal Medicine

## 2015-08-21 ENCOUNTER — Other Ambulatory Visit: Payer: Self-pay | Admitting: Internal Medicine

## 2015-08-22 NOTE — Telephone Encounter (Signed)
Rx faxed to [facility].  

## 2015-08-22 NOTE — Telephone Encounter (Signed)
Pt is requesting refill on Alprazolam.  Last OV: 08/01/2015 Last Fill: 04/18/2015 #60 and 3RF UDS: 03/03/2014 Low risk  Please advise.

## 2015-08-22 NOTE — Telephone Encounter (Signed)
Okay #60 and 2 refills 

## 2015-08-22 NOTE — Telephone Encounter (Signed)
Rx printed, awaiting MD signature.  

## 2015-08-23 ENCOUNTER — Encounter: Payer: Self-pay | Admitting: Gastroenterology

## 2015-08-24 ENCOUNTER — Other Ambulatory Visit: Payer: Self-pay | Admitting: Internal Medicine

## 2015-08-27 ENCOUNTER — Other Ambulatory Visit: Payer: Self-pay | Admitting: Internal Medicine

## 2015-08-30 ENCOUNTER — Other Ambulatory Visit: Payer: Self-pay | Admitting: Internal Medicine

## 2015-09-01 NOTE — Telephone Encounter (Signed)
Faxed hardcopy [facility] [place] 

## 2015-09-14 ENCOUNTER — Telehealth: Payer: Self-pay | Admitting: Internal Medicine

## 2015-09-14 ENCOUNTER — Other Ambulatory Visit: Payer: Self-pay | Admitting: Gastroenterology

## 2015-10-05 ENCOUNTER — Encounter: Payer: BLUE CROSS/BLUE SHIELD | Admitting: Internal Medicine

## 2015-10-19 NOTE — Telephone Encounter (Signed)
Complete

## 2015-11-18 ENCOUNTER — Encounter: Payer: Self-pay | Admitting: Gastroenterology

## 2015-11-29 ENCOUNTER — Other Ambulatory Visit: Payer: Self-pay | Admitting: Internal Medicine

## 2015-11-30 NOTE — Telephone Encounter (Signed)
Pt is requesting refill on Alprazolam.  Last OV: 08/01/2015 Last Fill: 08/30/2015 #60 and 0RF UDS: 03/03/2014 Low risk  Please advise.

## 2015-11-30 NOTE — Telephone Encounter (Signed)
Okay #60, 1 refill 

## 2015-11-30 NOTE — Telephone Encounter (Signed)
Rx printed, awaiting MD signature.  

## 2015-11-30 NOTE — Telephone Encounter (Signed)
Rx faxed to [facility].  

## 2015-12-03 ENCOUNTER — Other Ambulatory Visit: Payer: Self-pay | Admitting: Internal Medicine

## 2015-12-20 ENCOUNTER — Ambulatory Visit (INDEPENDENT_AMBULATORY_CARE_PROVIDER_SITE_OTHER): Payer: BLUE CROSS/BLUE SHIELD | Admitting: Internal Medicine

## 2015-12-20 ENCOUNTER — Encounter: Payer: Self-pay | Admitting: Internal Medicine

## 2015-12-20 VITALS — BP 124/80 | HR 82 | Temp 98.0°F | Resp 14 | Ht 69.0 in | Wt 183.5 lb

## 2015-12-20 DIAGNOSIS — Z0000 Encounter for general adult medical examination without abnormal findings: Secondary | ICD-10-CM | POA: Diagnosis not present

## 2015-12-20 DIAGNOSIS — K429 Umbilical hernia without obstruction or gangrene: Secondary | ICD-10-CM

## 2015-12-20 DIAGNOSIS — R972 Elevated prostate specific antigen [PSA]: Secondary | ICD-10-CM | POA: Diagnosis not present

## 2015-12-20 NOTE — Progress Notes (Signed)
Subjective:    Patient ID: Ryan Smith, male    DOB: 1955/10/20, 60 y.o.   MRN: [id number]  DOS:  12/20/2015 Type of visit - description : cpx Interval history: No major concerns, good medication compliance   Review of Systems  Constitutional: No fever. No chills. No unexplained wt changes. No unusual sweats  HEENT: No dental problems, no ear discharge, no facial swelling, no voice changes. No eye discharge, no eye  redness , no  intolerance to light   Respiratory: No wheezing , no  difficulty breathing. No cough , no mucus production  Cardiovascular: No CP, no leg swelling , no  Palpitations  GI: no nausea, no vomiting, no diarrhea , no  abdominal pain.  No blood in the stools. No dysphagia, no odynophagia    Endocrine: No polyphagia, no polyuria , no polydipsia  GU: No dysuria, gross hematuria, difficulty urinating. No urinary urgency, no frequency.  Musculoskeletal: No joint swellings or unusual aches or pains  Skin: No change in the color of the skin, palor , no  Rash  Allergic, immunologic: No environmental allergies , no  food allergies  Neurological: No dizziness no  syncope. No headaches. No diplopia, no slurred, no slurred speech, no motor deficits, no facial  Numbness  Hematological: No enlarged lymph nodes, no easy bruising , no unusual bleedings  Psychiatry: No suicidal ideas, no hallucinations, no beavior problems, no confusion.  No unusual/severe anxiety, no depression  Past Medical History:  Diagnosis Date  . Abnormal LFTs    2008 neg. hep. serology, (-) ANA, ceruloplasmin, etc.;  liver biopsy showed  minimal active inflammation  . Anxiety   . Barrett's esophagus   . Esophageal reflux   . Hearing loss   . Hiatal hernia   . History of diverticulitis of colon   . Personal history of colonic polyps    tublar adenomas 2008 & hyerplastic 2011  . PVD (peripheral vascular disease) ([place])     Past Surgical History:  Procedure Laterality Date  .  ABDOMINAL AORTAGRAM N/A 11/25/2013   Procedure: ABDOMINAL Maxcine Ham;  Surgeon: Wellington Hampshire, MD;  Location: [facility];  Service: Cardiovascular;  Laterality: N/A;  . Laparoscopically-assisted Sigmoid Colectomy  11/10/2007  . LIVER BIOPSY     minimal active inflammation    Social History   Social History  . Marital status: Married    Spouse name: N/A  . Number of children: 2  . Years of education: N/A   Occupational History  . Therapist, music, [employer]   Social History Main Topics  . Smoking status: Current Every Day Smoker    Years: 30.00    Types: Cigarettes  . Smokeless tobacco: Never Used     Comment: 3-4 cigarettes daily  . Alcohol use No     Comment: 2-3 glasses wine glasses daily  . Drug use: No  . Sexual activity: Not on file   Other Topics Concern  . Not on file   Social History Narrative   Born in [place]   Married, 2 step sons (adults)     Family History  Problem Relation Age of Onset  . Prostate cancer Father 24  . Esophageal cancer Father   . Coronary artery disease Father     dx age 31, CABG  . Heart disease Father   . Heart disease Paternal Uncle     x 2  . Colon cancer Neg Hx   . Diabetes Neg Hx  Medication List       Accurate as of 12/20/15  3:44 PM. Always use your most recent med list.          ALPRAZolam 0.5 MG tablet Commonly known as:  XANAX Take 1 tablet (0.5 mg total) by mouth 2 (two) times daily as needed for anxiety.   aspirin EC 81 MG tablet Take 162 mg by mouth daily.   atorvastatin 10 MG tablet Commonly known as:  LIPITOR Take 1 tablet (10 mg total) by mouth daily.   Coenzyme Q10 10 MG capsule Take 10 mg by mouth daily.   dexlansoprazole 60 MG capsule Commonly known as:  DEXILANT Take 1 capsule (60 mg total) by mouth daily.   Magnesium 500 MG Caps Take 1 capsule by mouth at bedtime.   peg 3350 powder 100 g Solr Commonly known as:  MOVIPREP Take 1 kit (200 g total) by mouth  once.   psyllium 58.6 % powder Commonly known as:  METAMUCIL Take 1 packet by mouth at bedtime.   ranitidine 300 MG capsule Commonly known as:  ZANTAC TAKE ONE CAPSULE BY MOUTH EVERY EVENING          Objective:   Physical Exam BP 124/80 (BP Location: Left Arm, Patient Position: Sitting, Cuff Size: Normal)   Pulse 82   Temp 98 F (36.7 C) (Oral)   Resp 14   Ht '5\' 9"'  (1.753 m)   Wt 183 lb 8 oz (83.2 kg)   SpO2 97%   BMI 27.10 kg/m   General:   Well developed, well nourished . NAD.  Neck: No  thyromegaly  HEENT:  Normocephalic . Face symmetric, atraumatic Lungs:  CTA B Normal respiratory effort, no intercostal retractions, no accessory muscle use. Heart: RRR,  no murmur.  No pretibial edema bilaterally  Abdomen:  Not distended, soft, non-tender. No rebound or rigidity.  Has a 1 inch umbilical hernia. Not tender. Skin: Exposed areas without rash. Not pale. Not jaundice Neurologic:  alert & oriented X3.  Speech normal, gait appropriate for age and unassisted Strength symmetric and appropriate for age.  Psych: Cognition and judgment appear intact.  Cooperative with normal attention span and concentration.  Behavior appropriate. No anxious or depressed appearing.    Assessment & Plan:   Assessment Anxiety Hyperlipidemia  PVD R leg, dx 2015 GI:  --Barrett's, GERD --Abnormal LFTs: 2008 neg. hep. serology, (-) ANA, ceruloplasmin, etc.;  liver biopsy showed  minimal active inflammation Hearing loss Sees dermatology q 6 ,months as off 12-2015  PLAN: Anxiety: on Xanax, contract and a UDS today Hyperlipidemia: Continue statins, labs. Barrett's : May need a PA prior to  next Rx, Patient stated that  dexilant was the  PPI that worked  the best for him Umbilical hernia: Likes it to be repaired, refer to surgery Elevated PSA: h/o elevated PSA, DRE normal 08/2015, last PSA decreasing, although is a little early to recheck, will do a PSA today. RTC 6-8 months

## 2015-12-20 NOTE — Patient Instructions (Signed)
GO TO THE LAB : Get the blood work     GO TO THE FRONT DESK Schedule your next appointment for a   routine checkup in 6 to 8 months 

## 2015-12-20 NOTE — Assessment & Plan Note (Addendum)
Td  2007, declined a booster today ; pnm 23  2016 zostavax-- discussed before , decided to wait  last colonoscopy 10/2009, polyp, schedule for a  cscope 02-2016 Prostate cancer screening:  see comments under elevated PSA  counseled diet, exercise and tobacco abuse (he is smoking very little)  Labs: CMP, FLP, CBC, TSH, PSA

## 2015-12-20 NOTE — Progress Notes (Signed)
Pre visit review using our clinic review tool, if applicable. No additional management support is needed unless otherwise documented below in the visit note. 

## 2015-12-21 LAB — CBC WITH DIFFERENTIAL/PLATELET
Basophils Absolute: 0 10*3/uL (ref 0.0–0.1)
Basophils Relative: 0.6 % (ref 0.0–3.0)
Eosinophils Absolute: 0.2 10*3/uL (ref 0.0–0.7)
Eosinophils Relative: 2.1 % (ref 0.0–5.0)
HCT: 48.9 % (ref 39.0–52.0)
Hemoglobin: 16.8 g/dL (ref 13.0–17.0)
Lymphocytes Relative: 31.7 % (ref 12.0–46.0)
Lymphs Abs: 2.3 10*3/uL (ref 0.7–4.0)
MCHC: 34.3 g/dL (ref 30.0–36.0)
MCV: 92.4 fl (ref 78.0–100.0)
Monocytes Absolute: 0.8 10*3/uL (ref 0.1–1.0)
Monocytes Relative: 10.5 % (ref 3.0–12.0)
Neutro Abs: 4 10*3/uL (ref 1.4–7.7)
Neutrophils Relative %: 55.1 % (ref 43.0–77.0)
Platelets: 343 10*3/uL (ref 150.0–400.0)
RBC: 5.29 Mil/uL (ref 4.22–5.81)
RDW: 13.2 % (ref 11.5–15.5)
WBC: 7.3 10*3/uL (ref 4.0–10.5)

## 2015-12-21 LAB — LIPID PANEL
Cholesterol: 183 mg/dL (ref 0–200)
HDL: 43.7 mg/dL (ref >39.00)
LDL Cholesterol: 113 mg/dL — ABNORMAL HIGH (ref 0–99)
NonHDL: 139.12
Total CHOL/HDL Ratio: 4
Triglycerides: 130 mg/dL (ref 0.0–149.0)
VLDL: 26 mg/dL (ref 0.0–40.0)

## 2015-12-21 LAB — COMPREHENSIVE METABOLIC PANEL
ALT: 47 U/L (ref 0–53)
AST: 26 U/L (ref 0–37)
Albumin: 4.4 g/dL (ref 3.5–5.2)
Alkaline Phosphatase: 90 U/L (ref 39–117)
BUN: 11 mg/dL (ref 6–23)
CO2: 30 mEq/L (ref 19–32)
Calcium: 9.3 mg/dL (ref 8.4–10.5)
Chloride: 103 mEq/L (ref 96–112)
Creatinine, Ser: 0.92 mg/dL (ref 0.40–1.50)
GFR: 88.91 mL/min (ref >60.00)
Glucose, Bld: 89 mg/dL (ref 70–99)
Potassium: 4.5 mEq/L (ref 3.5–5.1)
Sodium: 141 mEq/L (ref 135–145)
Total Bilirubin: 0.7 mg/dL (ref 0.2–1.2)
Total Protein: 6.8 g/dL (ref 6.0–8.3)

## 2015-12-21 LAB — TSH: TSH: 2.18 u[IU]/mL (ref 0.35–4.50)

## 2015-12-21 LAB — PSA: PSA: 4.07 ng/mL — ABNORMAL HIGH (ref 0.10–4.00)

## 2015-12-21 NOTE — Assessment & Plan Note (Signed)
Anxiety: on Xanax, contract and a UDS today Hyperlipidemia: Continue statins, labs. Barrett's : May need a PA prior to  next Rx, Patient stated that  dexilant was the  PPI that worked  the best for him Umbilical hernia: Likes it to be repaired, refer to surgery Elevated PSA: h/o elevated PSA, DRE normal 08/2015, last PSA decreasing, although is a little early to recheck, will do a PSA today. RTC 6-8 months

## 2015-12-23 MED ORDER — EZETIMIBE 10 MG PO TABS: 10.0000 mg | ORAL_TABLET | Freq: Every day | ORAL | 11 refills | Status: DC

## 2015-12-23 NOTE — Addendum Note (Signed)
Addended byDamita Dunnings D on: 12/23/2015 04:08 PM   Modules accepted: Orders

## 2015-12-26 ENCOUNTER — Telehealth: Payer: Self-pay

## 2015-12-26 NOTE — Telephone Encounter (Signed)
PA initiated via Covermymeds; KEY: [id number]. Awaiting determination.

## 2015-12-30 ENCOUNTER — Telehealth: Payer: Self-pay

## 2015-12-30 NOTE — Telephone Encounter (Signed)
Received PA denial. Medication denied due to medication being deemed not "medically necessary." No alternatives given. Appeal can be submitted in writing up to 30 days after denial. Please advise.

## 2015-12-30 NOTE — Telephone Encounter (Signed)
UDS: 12/20/2015  Negative for Alprazolam:PRN   Low risk per Dr. Larose Kells 12/30/2015

## 2016-01-02 NOTE — Telephone Encounter (Signed)
Okay, advise patient of his insurance denial. Options: 1. Continue Lipitor and start the appeal process for Zetia on the basis that he is unable to take higher doses of Lipitor 2. Switch to Crestor 10 mg #30, 6 refills, AST, ALT an FLP in 6 weeks. Proceed according to patient's preference

## 2016-01-02 NOTE — Telephone Encounter (Signed)
LMOM informing Pt to return call.  

## 2016-01-03 NOTE — Telephone Encounter (Signed)
Unable to contact Pt, letter printed and mailed to Pt asking him to call office to discuss lab results from 12/20/2015.

## 2016-01-18 NOTE — Telephone Encounter (Signed)
PA notification letter sent for scanning.

## 2016-01-20 ENCOUNTER — Other Ambulatory Visit: Payer: Self-pay | Admitting: Gastroenterology

## 2016-01-24 ENCOUNTER — Ambulatory Visit (AMBULATORY_SURGERY_CENTER): Payer: Self-pay

## 2016-01-24 VITALS — Ht 68.5 in | Wt 189.6 lb

## 2016-01-24 DIAGNOSIS — K2270 Barrett's esophagus without dysplasia: Secondary | ICD-10-CM

## 2016-01-24 DIAGNOSIS — Z86010 Personal history of colonic polyps: Secondary | ICD-10-CM

## 2016-01-24 MED ORDER — NA SULFATE-K SULFATE-MG SULF 17.5-3.13-1.6 GM/177ML PO SOLN: ORAL | 0 refills | Status: DC

## 2016-01-24 NOTE — Progress Notes (Signed)
Per pt, no allergies to soy or egg products.Pt not taking any weight loss meds or using  O2 at home.   While explaining the Suprep, pt states he had the Moviprep at home and wanted to use it instead of paying more money for the [place]. Both Suprep and Moviprep instructions were given to the pt and questions were answered.Manus Gunning Rx was sent to the pharmacy in case the pt would use it later.The pt will call back if he has further questions!

## 2016-01-25 ENCOUNTER — Encounter: Payer: Self-pay | Admitting: Gastroenterology

## 2016-02-03 ENCOUNTER — Ambulatory Visit (INDEPENDENT_AMBULATORY_CARE_PROVIDER_SITE_OTHER): Payer: BLUE CROSS/BLUE SHIELD | Admitting: Internal Medicine

## 2016-02-03 ENCOUNTER — Encounter: Payer: Self-pay | Admitting: Internal Medicine

## 2016-02-03 VITALS — BP 128/80 | HR 79 | Temp 97.8°F | Resp 14 | Ht 69.0 in | Wt 185.5 lb

## 2016-02-03 DIAGNOSIS — S8991XA Unspecified injury of right lower leg, initial encounter: Secondary | ICD-10-CM

## 2016-02-03 NOTE — Progress Notes (Signed)
Pre visit review using our clinic review tool, if applicable. No additional management support is needed unless otherwise documented below in the visit note. 

## 2016-02-03 NOTE — Progress Notes (Signed)
Subjective:    Patient ID: Ryan Smith, male    DOB: 1955-11-19, 60 y.o.   MRN: [id number]  DOS:  02/03/2016 Type of visit - description : acute Interval history: 2 Weeks ago, was playing golf, developed sudden, intense right inguinal and internal thigh pain while swimging. He had to quit his game early. The next day, the thigh was "black and blue", since then  color is getting better, she still has some leg pain, around the right groin but also some at the right ankle. He has a history of peripheral vascular disease,  denies classic claudication, no calf pain.  Review of Systems Back pain No bleeding anywhere else The pain at the right groin also encompassed the right scrotum  Past Medical History:  Diagnosis Date  . Abnormal LFTs    2008 neg. hep. serology, (-) ANA, ceruloplasmin, etc.;  liver biopsy showed  minimal active inflammation  . Anxiety   . Barrett's esophagus   . Esophageal reflux   . Hearing loss    has hearing aids  . Hiatal hernia   . History of diverticulitis of colon   . Personal history of colonic polyps    tublar adenomas 2008 & hyerplastic 2011  . PVD (peripheral vascular disease) ([place])     Past Surgical History:  Procedure Laterality Date  . ABDOMINAL AORTAGRAM N/A 11/25/2013   Procedure: ABDOMINAL Maxcine Ham;  Surgeon: Wellington Hampshire, MD;  Location: [facility];  Service: Cardiovascular;  Laterality: N/A;  . COLONOSCOPY    . Laparoscopically-assisted Sigmoid Colectomy  11/10/2007  . LIVER BIOPSY     minimal active inflammation    Social History   Social History  . Marital status: Married    Spouse name: N/A  . Number of children: 2  . Years of education: N/A   Occupational History  . Therapist, music, [employer]   Social History Main Topics  . Smoking status: Current Every Day Smoker    Years: 30.00    Types: Cigarettes  . Smokeless tobacco: Never Used     Comment: 3-4 cigarettes daily  . Alcohol use 12.6 oz/week    21  Glasses of wine per week     Comment: 2-3 glasses wine glasses daily  . Drug use: No  . Sexual activity: Not on file   Other Topics Concern  . Not on file   Social History Narrative   Born in [place]   Married, 2 step sons (adults)        Medication List       Accurate as of 02/03/16  2:08 PM. Always use your most recent med list.          ALPRAZolam 0.5 MG tablet Commonly known as:  XANAX Take 1 tablet (0.5 mg total) by mouth 2 (two) times daily as needed for anxiety.   aspirin EC 81 MG tablet Take 162 mg by mouth daily.   atorvastatin 10 MG tablet Commonly known as:  LIPITOR Take 1 tablet (10 mg total) by mouth daily.   Coenzyme Q10 10 MG capsule Take 10 mg by mouth daily.   dexlansoprazole 60 MG capsule Commonly known as:  DEXILANT Take 1 capsule (60 mg total) by mouth daily.   ezetimibe 10 MG tablet Commonly known as:  ZETIA Take 1 tablet (10 mg total) by mouth daily.   Magnesium 500 MG Caps Take 1 capsule by mouth at bedtime.   Na Sulfate-K Sulfate-Mg Sulf 17.5-3.13-1.6 GM/180ML Soln Commonly known as:  SUPREP BOWEL PREP KIT Suprep as directed / no substitutions   psyllium 58.6 % powder Commonly known as:  METAMUCIL Take 1 packet by mouth at bedtime.   ranitidine 300 MG capsule Commonly known as:  ZANTAC TAKE ONE CAPSULE BY MOUTH EVERY EVENING          Objective:   Physical Exam  Skin:      BP 128/80 (BP Location: Left Arm, Patient Position: Sitting, Cuff Size: Normal)   Pulse 79   Temp 97.8 F (36.6 C) (Oral)   Resp 14   Ht '5\' 9"'  (1.753 m)   Wt 185 lb 8 oz (84.1 kg)   SpO2 98%   BMI 27.39 kg/m   General:   Well developed, well nourished . NAD.  HEENT:  Normocephalic . Face symmetric, atraumatic Abdomen:  Not distended, soft, non-tender. No rebound or rigidity.  No bruit  Extremities: Calves symmetric Slightly TTP at the right groin without mass or swelling. Palpable femoral and pedal pulses. MSK: Rotation of the  hips full without pain GU: Testicles symmetric Skin: See graphic Neurologic:  alert & oriented X3.  Speech normal, gait appropriate for age and unassisted Psych--  Cognition and judgment appear intact.  Cooperative with normal attention span and concentration.  Behavior appropriate. No anxious or depressed appearing.     Assessment & Plan:  Assessment Anxiety Hyperlipidemia  PVD R leg, dx 2015, [name] referred pt to vascular surgery  GI:  --Barrett's, GERD --Abnormal LFTs: 2008 neg. hep. serology, (-) ANA, ceruloplasmin, etc.;  liver biopsy showed  minimal active inflammation Hearing loss Sees dermatology q 6 ,months as off 12-2015  PLAN: Right LE sprain: As described above, probably he tear a ligament leading to bleeding. He has peripheral vascular disease but currently with no claudication and   pulses are palpable. Doubt DVT since his calves are symmetric. Rx observation for now. Will call if swelling or his is more fresh (dark blue) hematomas. He is in agreement

## 2016-02-05 NOTE — Assessment & Plan Note (Signed)
Right LE sprain: As described above, probably he tear a ligament leading to bleeding. He has peripheral vascular disease but currently with no claudication and   pulses are palpable. Doubt DVT since his calves are symmetric. Rx observation for now. Will call if swelling or his is more fresh (dark blue) hematomas. He is in agreement

## 2016-02-07 ENCOUNTER — Encounter: Payer: Self-pay | Admitting: Gastroenterology

## 2016-02-07 ENCOUNTER — Other Ambulatory Visit: Payer: Self-pay | Admitting: Gastroenterology

## 2016-02-07 ENCOUNTER — Ambulatory Visit (AMBULATORY_SURGERY_CENTER): Payer: BLUE CROSS/BLUE SHIELD | Admitting: Gastroenterology

## 2016-02-07 VITALS — BP 130/84 | HR 63 | Temp 98.0°F | Resp 18 | Ht 68.5 in | Wt 189.0 lb

## 2016-02-07 DIAGNOSIS — D125 Benign neoplasm of sigmoid colon: Secondary | ICD-10-CM | POA: Diagnosis not present

## 2016-02-07 DIAGNOSIS — K2270 Barrett's esophagus without dysplasia: Secondary | ICD-10-CM

## 2016-02-07 DIAGNOSIS — Z86010 Personal history of colonic polyps: Secondary | ICD-10-CM

## 2016-02-07 DIAGNOSIS — D123 Benign neoplasm of transverse colon: Secondary | ICD-10-CM

## 2016-02-07 HISTORY — PX: COLONOSCOPY: SHX174

## 2016-02-07 HISTORY — PX: UPPER GASTROINTESTINAL ENDOSCOPY: SHX188

## 2016-02-07 MED ORDER — SODIUM CHLORIDE 0.9 % IV SOLN: 500.0000 mL | INTRAVENOUS | Status: DC

## 2016-02-07 NOTE — Progress Notes (Signed)
Called to room to assist during endoscopic procedure.  Patient ID and intended procedure confirmed with present staff. Received instructions for my participation in the procedure from the performing physician.  

## 2016-02-07 NOTE — Progress Notes (Signed)
Report to PACU, RN, vss, BBS= Clear.  

## 2016-02-07 NOTE — Op Note (Signed)
[facility] Patient Name: Ryan Smith Procedure Date: 02/07/2016 9:03 AM MRN: [id number] Endoscopist: Ladene Artist , MD Age: 60 Referring MD:  Date of Birth: [birth year] Gender: Male Account #: [id number] Procedure:                Colonoscopy Indications:              Surveillance: Personal history of adenomatous                            polyps on last colonoscopy > 5 years ago Medicines:                Monitored Anesthesia Care Procedure:                Pre-Anesthesia Assessment:                           - Prior to the procedure, a History and Physical                            was performed, and patient medications and                            allergies were reviewed. The patient's tolerance of                            previous anesthesia was also reviewed. The risks                            and benefits of the procedure and the sedation                            options and risks were discussed with the patient.                            All questions were answered, and informed consent                            was obtained. Prior Anticoagulants: The patient has                            taken no previous anticoagulant or antiplatelet                            agents. ASA Grade Assessment: II - A patient with                            mild systemic disease. After reviewing the risks                            and benefits, the patient was deemed in                            satisfactory condition to undergo the procedure.  After obtaining informed consent, the colonoscope                            was passed under direct vision. Throughout the                            procedure, the patient's blood pressure, pulse, and                            oxygen saturations were monitored continuously. The                            Model PCF-H190L [id number]) scope was introduced                            through the anus and  advanced to the the cecum,                            identified by appendiceal orifice and ileocecal                            valve. The ileocecal valve, appendiceal orifice,                            and rectum were photographed. The quality of the                            bowel preparation was good. The colonoscopy was                            performed without difficulty. The patient tolerated                            the procedure well. Scope In: 9:12:35 AM Scope Out: 9:26:36 AM Scope Withdrawal Time: 0 hours 12 minutes 51 seconds  Total Procedure Duration: 0 hours 14 minutes 1 second  Findings:                 The perianal and digital rectal examinations were                            normal.                           Three sessile polyps were found in the sigmoid                            colon and transverse colon. The polyps were 6 to 7                            mm in size. These polyps were removed with a cold                            snare. Resection and retrieval were complete.  Internal hemorrhoids were found during                            retroflexion. The hemorrhoids were small and Grade                            I (internal hemorrhoids that do not prolapse).                           There was evidence of a prior end-to-end                            colo-colonic anastomosis in the sigmoid colon. This                            was patent and was characterized by healthy                            appearing mucosa. The anastomosis was traversed.                           The exam was otherwise without abnormality on                            direct and retroflexion views. Complications:            No immediate complications. Estimated blood loss:                            None. Estimated Blood Loss:     Estimated blood loss: none. Impression:               - Three 6 to 7 mm polyps in the sigmoid colon (2)                             and in the transverse colon, removed with a cold                            snare. Resected and retrieved.                           - Internal hemorrhoids.                           - Patent end-to-end colo-colonic anastomosis,                            characterized by healthy appearing mucosa.                           - The examination was otherwise normal on direct                            and retroflexion views. Recommendation:           - Repeat colonoscopy in 5 years for surveillance.                           -  Patient has a contact number available for                            emergencies. The signs and symptoms of potential                            delayed complications were discussed with the                            patient. Return to normal activities tomorrow.                            Written discharge instructions were provided to the                            patient.                           - Resume previous diet.                           - Continue present medications.                           - Await pathology results. Ladene Artist, MD 02/07/2016 9:36:31 AM This report has been signed electronically.

## 2016-02-07 NOTE — Op Note (Signed)
[facility] Patient Name: Ryan Smith Procedure Date: 02/07/2016 9:03 AM MRN: [id number] Endoscopist: Ladene Artist , MD Age: 60 Referring MD:  Date of Birth: 1956/02/18 Gender: Male Account #: [id number] Procedure:                Upper GI endoscopy Indications:              Surveillance for malignancy due to personal history                            of Barrett's esophagus Medicines:                Monitored Anesthesia Care Procedure:                Pre-Anesthesia Assessment:                           - Prior to the procedure, a History and Physical                            was performed, and patient medications and                            allergies were reviewed. The patient's tolerance of                            previous anesthesia was also reviewed. The risks                            and benefits of the procedure and the sedation                            options and risks were discussed with the patient.                            All questions were answered, and informed consent                            was obtained. Prior Anticoagulants: The patient has                            taken no previous anticoagulant or antiplatelet                            agents. ASA Grade Assessment: II - A patient with                            mild systemic disease. After reviewing the risks                            and benefits, the patient was deemed in                            satisfactory condition to undergo the procedure.  After obtaining informed consent, the endoscope was                            passed under direct vision. Throughout the                            procedure, the patient's blood pressure, pulse, and                            oxygen saturations were monitored continuously. The                            Model GIF-[id number]) scope was introduced                            through the mouth, and advanced  to the second part                            of duodenum. The upper GI endoscopy was                            accomplished without difficulty. The patient                            tolerated the procedure well. Scope In: Scope Out: Findings:                 There were esophageal mucosal changes secondary to                            established short-segment Barrett's disease present                            in the distal esophagus. The maximum longitudinal                            extent of these mucosal changes was 2 cm in length.                            Mucosa was biopsied with a cold forceps for                            histology in a targeted manner at intervals of 1 cm                            in the lower third of the esophagus. One specimen                            bottle was sent to pathology.                           The exam of the esophagus was otherwise normal.  A small hiatal hernia was present.                           The exam of the stomach was otherwise normal.                           The duodenal bulb and second portion of the                            duodenum were normal. Complications:            No immediate complications. Estimated Blood Loss:     Estimated blood loss: none. Estimated blood loss                            was minimal. Impression:               - Esophageal mucosal changes secondary to                            established short-segment Barrett's disease.                            Biopsied.                           - Small hiatal hernia.                           - Normal duodenal bulb and second portion of the                            duodenum. Recommendation:           - Patient has a contact number available for                            emergencies. The signs and symptoms of potential                            delayed complications were discussed with the                             patient. Return to normal activities tomorrow.                            Written discharge instructions were provided to the                            patient.                           - Resume previous diet.                           - Continue present medications.                           -  Await pathology results.                           - Repeat upper endoscopy in 3 years for                            surveillance. Ladene Artist, MD 02/07/2016 9:39:50 AM This report has been signed electronically.

## 2016-02-07 NOTE — Patient Instructions (Signed)
YOU HAD AN ENDOSCOPIC PROCEDURE TODAY AT THE [facility]:   Refer to the procedure report that was given to you for any specific questions about what was found during the examination.  If the procedure report does not answer your questions, please call your gastroenterologist to clarify.  If you requested that your care partner not be given the details of your procedure findings, then the procedure report has been included in a sealed envelope for you to review at your convenience later.  YOU SHOULD EXPECT: Some feelings of bloating in the abdomen. Passage of more gas than usual.  Walking can help get rid of the air that was put into your GI tract during the procedure and reduce the bloating. If you had a lower endoscopy (such as a colonoscopy or flexible sigmoidoscopy) you may notice spotting of blood in your stool or on the toilet paper. If you underwent a bowel prep for your procedure, you may not have a normal bowel movement for a few days.  Please Note:  You might notice some irritation and congestion in your nose or some drainage.  This is from the oxygen used during your procedure.  There is no need for concern and it should clear up in a day or so.  SYMPTOMS TO REPORT IMMEDIATELY:   Following lower endoscopy (colonoscopy or flexible sigmoidoscopy):  Excessive amounts of blood in the stool  Significant tenderness or worsening of abdominal pains  Swelling of the abdomen that is new, acute  Fever of 100F or higher   Following upper endoscopy (EGD)  Vomiting of blood or coffee ground material  New chest pain or pain under the shoulder blades  Painful or persistently difficult swallowing  New shortness of breath  Fever of 100F or higher  Black, tarry-looking stools  For urgent or emergent issues, a gastroenterologist can be reached at any hour by calling [phone].   DIET:  We do recommend a small meal at first, but then you may proceed to your regular diet.  Drink  plenty of fluids but you should avoid alcoholic beverages for 24 hours.  ACTIVITY:  You should plan to take it easy for the rest of today and you should NOT DRIVE or use heavy machinery until tomorrow (because of the sedation medicines used during the test).    FOLLOW UP: Our staff will call the number listed on your records the next business day following your procedure to check on you and address any questions or concerns that you may have regarding the information given to you following your procedure. If we do not reach you, we will leave a message.  However, if you are feeling well and you are not experiencing any problems, there is no need to return our call.  We will assume that you have returned to your regular daily activities without incident.  If any biopsies were taken you will be contacted by phone or by letter within the next 1-3 weeks.  Please call us at [phone] if you have not heard about the biopsies in 3 weeks.    SIGNATURES/CONFIDENTIALITY: You and/or your care partner have signed paperwork which will be entered into your electronic medical record.  These signatures attest to the fact that that the information above on your After Visit Summary has been reviewed and is understood.  Full responsibility of the confidentiality of this discharge information lies with you and/or your care-partner.  Read all of the handouts given to you by your recovery   room nurse.  Thank-you for choosing us for your healthcare needs today. 

## 2016-02-08 ENCOUNTER — Telehealth: Payer: Self-pay | Admitting: *Deleted

## 2016-02-08 ENCOUNTER — Telehealth: Payer: Self-pay

## 2016-02-08 NOTE — Telephone Encounter (Signed)
  Follow up Call-  Call back number 02/07/2016  Post procedure Call Back phone  # [phone]  Permission to leave phone message Yes  Some recent data might be hidden     Patient questions:  Do you have a fever, pain , or abdominal swelling? No. Pain Score  0 *  Have you tolerated food without any problems? Yes.    Have you been able to return to your normal activities? Yes.    Do you have any questions about your discharge instructions: Diet   No. Medications  No. Follow up visit  No.  Do you have questions or concerns about your Care? No.  Actions: * If pain score is 4 or above: No action needed, pain <4.

## 2016-02-08 NOTE — Telephone Encounter (Signed)
  Follow up Call-  Call back number 02/07/2016  Post procedure Call Back phone  # [phone]  Permission to leave phone message Yes  Some recent data might be hidden     No answer, left message.

## 2016-02-13 ENCOUNTER — Encounter: Payer: Self-pay | Admitting: Gastroenterology

## 2016-02-15 ENCOUNTER — Other Ambulatory Visit: Payer: Self-pay | Admitting: Internal Medicine

## 2016-02-16 NOTE — Telephone Encounter (Signed)
Ok 60 and 5 RF 

## 2016-02-16 NOTE — Telephone Encounter (Signed)
Rx printed, awaiting MD signature.  

## 2016-02-16 NOTE — Telephone Encounter (Signed)
Rx faxed to [facility].  

## 2016-02-16 NOTE — Telephone Encounter (Signed)
Pt is requesting refill on Alprazolam.  Last OV: 02/03/2016 Last Fill: 11/30/2015 #60 and 1RF UDS: 12/20/2015 Low risk  Please advise.

## 2016-02-20 ENCOUNTER — Telehealth: Payer: Self-pay | Admitting: Gastroenterology

## 2016-02-20 NOTE — Telephone Encounter (Signed)
Informed patient that I will contact his insurance company and do a PA on [place] and will contact him as soon as they fax there answer.

## 2016-02-20 NOTE — Telephone Encounter (Signed)
Initiated PA on cover my meds online for rx Dexilant. Waiting on response from [place].

## 2016-02-21 NOTE — Telephone Encounter (Signed)
Received fax from [facility] that coverage for Dexilant was approved and is effective from 02/20/16 to 02/19/17. Patient notified and he verbalized understanding.

## 2016-03-08 ENCOUNTER — Other Ambulatory Visit: Payer: Self-pay | Admitting: Internal Medicine

## 2016-05-20 ENCOUNTER — Other Ambulatory Visit: Payer: Self-pay | Admitting: Internal Medicine

## 2016-06-01 ENCOUNTER — Other Ambulatory Visit: Payer: Self-pay | Admitting: Internal Medicine

## 2016-06-11 ENCOUNTER — Encounter: Payer: BLUE CROSS/BLUE SHIELD | Admitting: Internal Medicine

## 2016-06-19 ENCOUNTER — Encounter: Payer: Self-pay | Admitting: Internal Medicine

## 2016-06-19 ENCOUNTER — Ambulatory Visit (INDEPENDENT_AMBULATORY_CARE_PROVIDER_SITE_OTHER): Payer: BLUE CROSS/BLUE SHIELD | Admitting: Internal Medicine

## 2016-06-19 VITALS — BP 128/80 | HR 84 | Temp 98.4°F | Resp 14 | Ht 69.0 in | Wt 190.1 lb

## 2016-06-19 DIAGNOSIS — Z0000 Encounter for general adult medical examination without abnormal findings: Secondary | ICD-10-CM | POA: Diagnosis not present

## 2016-06-19 DIAGNOSIS — Z23 Encounter for immunization: Secondary | ICD-10-CM

## 2016-06-19 DIAGNOSIS — Z114 Encounter for screening for human immunodeficiency virus [HIV]: Secondary | ICD-10-CM

## 2016-06-19 MED ORDER — ROSUVASTATIN CALCIUM 10 MG PO TABS: 10.0000 mg | ORAL_TABLET | Freq: Every day | ORAL | 0 refills | Status: DC

## 2016-06-19 NOTE — Assessment & Plan Note (Signed)
Hyperlipidemia, on Lipitor 10 mg, needs better control, intolerant to higher doses of atorvastatin and is unable to afford Zetia. Plan: Change to Crestor 10 mg. Labs in 6 weeks Elevated PSA: Recheck in 6 weeks RTC 6 months

## 2016-06-19 NOTE — Assessment & Plan Note (Addendum)
--  Td  05-2016 ; pnm 23  2016; zostavax-- discussed before ; had a flu shot @ work --[facility]: last colonoscopy 10/2009, polyp,   cscope 02-2016: + Polyps, 5 years. --Prostate cancer screening:  see comments under elevated PSA --Exercise discussed --Tobacco: Quit about 2 weeks ago, still craving, counseled, nicotine supplements? Medications? --Labs to be done in 6 weeks: BMP, AST, ALT, FLP, PSA, HIV

## 2016-06-19 NOTE — Progress Notes (Signed)
Pre visit review using our clinic review tool, if applicable. No additional management support is needed unless otherwise documented below in the visit note. 

## 2016-06-19 NOTE — Progress Notes (Signed)
Subjective:    Patient ID: Ryan Smith, male    DOB: 10-26-1955, 61 y.o.   MRN: [id number]  DOS:  06/19/2016 Type of visit - description : cpx Interval history:  No major concerns, quit tobacco 2 weeks ago, still struggling, has gained some weight.   Review of Systems  A 14 point review of systems is negative    Past Medical History:  Diagnosis Date  . Abnormal LFTs    2008 neg. hep. serology, (-) ANA, ceruloplasmin, etc.;  liver biopsy showed  minimal active inflammation  . Anxiety   . Barrett's esophagus   . Esophageal reflux   . Hearing loss    has hearing aids  . Hiatal hernia   . History of diverticulitis of colon   . Personal history of colonic polyps    tublar adenomas 2008 & hyerplastic 2011  . PVD (peripheral vascular disease) ([place])     Past Surgical History:  Procedure Laterality Date  . ABDOMINAL AORTAGRAM N/A 11/25/2013   Procedure: ABDOMINAL Maxcine Ham;  Surgeon: Wellington Hampshire, MD;  Location: [facility];  Service: Cardiovascular;  Laterality: N/A;  . COLONOSCOPY    . Laparoscopically-assisted Sigmoid Colectomy  11/10/2007  . LIVER BIOPSY     minimal active inflammation    Social History   Social History  . Marital status: Married    Spouse name: N/A  . Number of children: 2  . Years of education: N/A   Occupational History  . Therapist, music, [employer]   Social History Main Topics  . Smoking status: Current Every Day Smoker    Years: 30.00    Types: Cigarettes  . Smokeless tobacco: Never Used     Comment: 3-4 cigarettes daily  . Alcohol use 12.6 oz/week    21 Glasses of wine per week     Comment: 2-3 glasses wine glasses daily  . Drug use: No  . Sexual activity: Not on file   Other Topics Concern  . Not on file   Social History Narrative   Born in [place]   Married, 2 step sons (adults)     Family History  Problem Relation Age of Onset  . Prostate cancer Father 84  . Esophageal cancer Father   .  Coronary artery disease Father     dx age 9, CABG  . Heart disease Father   . Heart disease Paternal Uncle     x 2  . Colon cancer Neg Hx   . Diabetes Neg Hx      Allergies as of 06/19/2016   No Known Allergies     Medication List       Accurate as of 06/19/16 11:46 AM. Always use your most recent med list.          ALPRAZolam 0.5 MG tablet Commonly known as:  XANAX Take 1 tablet (0.5 mg total) by mouth 2 (two) times daily as needed for anxiety.   aspirin EC 81 MG tablet Take 162 mg by mouth daily.   atorvastatin 10 MG tablet Commonly known as:  LIPITOR Take 1 tablet (10 mg total) by mouth daily.   Coenzyme Q10 10 MG capsule Take 10 mg by mouth daily.   DEXILANT 60 MG capsule Generic drug:  dexlansoprazole TAKE 1 CAPSULE (60 MG TOTAL) BY MOUTH DAILY.   ezetimibe 10 MG tablet Commonly known as:  ZETIA Take 1 tablet (10 mg total) by mouth daily.   Magnesium 500 MG Caps Take 1 capsule by  mouth at bedtime.   psyllium 58.6 % powder Commonly known as:  METAMUCIL Take 1 packet by mouth at bedtime.   ranitidine 300 MG capsule Commonly known as:  ZANTAC TAKE ONE CAPSULE BY MOUTH EVERY EVENING          Objective:   Physical Exam BP 128/80 (BP Location: Left Arm, Patient Position: Sitting, Cuff Size: Normal)   Pulse 84   Temp 98.4 F (36.9 C) (Oral)   Resp 14   Ht 5\' 9"  (1.753 m)   Wt 190 lb 2 oz (86.2 kg)   SpO2 98%   BMI 28.08 kg/m   General:   Well developed, well nourished . NAD.  Neck: No  thyromegaly  HEENT:  Normocephalic . Face symmetric, atraumatic Lungs:  CTA B Normal respiratory effort, no intercostal retractions, no accessory muscle use. Heart: RRR,  no murmur.  No pretibial edema bilaterally  Abdomen:  Not distended, soft, non-tender. No rebound or rigidity.  Small reducible hernia Skin: Exposed areas without rash. Not pale. Not jaundice Neurologic:  alert & oriented X3.  Speech normal, gait appropriate for age and  unassisted Strength symmetric and appropriate for age.  Psych: Cognition and judgment appear intact.  Cooperative with normal attention span and concentration.  Behavior appropriate. No anxious or depressed appearing.    Assessment & Plan:   Assessment Anxiety Hyperlipidemia  PVD R leg, dx 2015,   claudication 10-2013, ABIs showed obstruction, saw [name], had a aortogram , was referred to surgery. They rx a CT chest show mild atherosclerosis disease of the aorta, coronary arthrosclerosis. Had a MRI of the right leg, see report. Was recommended a bypass but sx  improved, did not proceed. No benefit from cilostazol GI:  --Barrett's, GERD, last EGD,BX : 02-2016  --Abnormal LFTs: 2008 neg. hep. serology, (-) ANA, ceruloplasmin, etc.;  liver bx:  minimal active inflammation Hearing loss, has aids  Sees dermatology q 6 ,months as off 12-2015 Stress 12-2013: Low risk  PLAN: Hyperlipidemia, on Lipitor 10 mg, needs better control, intolerant to higher doses of atorvastatin and is unable to afford Zetia. Plan: Change to Crestor 10 mg. Labs in 6 weeks Elevated PSA: Recheck in 6 weeks RTC 6 months

## 2016-06-19 NOTE — Patient Instructions (Signed)
  GO TO THE FRONT DESK  Schedule labs to be done in 6 weeks, fasting.  Next visit with me in 6 months  Stop Lipitor, start Crestor.

## 2016-07-31 ENCOUNTER — Other Ambulatory Visit: Payer: BLUE CROSS/BLUE SHIELD

## 2016-08-01 ENCOUNTER — Other Ambulatory Visit (INDEPENDENT_AMBULATORY_CARE_PROVIDER_SITE_OTHER): Payer: BLUE CROSS/BLUE SHIELD

## 2016-08-01 DIAGNOSIS — Z0000 Encounter for general adult medical examination without abnormal findings: Secondary | ICD-10-CM | POA: Diagnosis not present

## 2016-08-01 DIAGNOSIS — R972 Elevated prostate specific antigen [PSA]: Secondary | ICD-10-CM

## 2016-08-01 DIAGNOSIS — Z114 Encounter for screening for human immunodeficiency virus [HIV]: Secondary | ICD-10-CM

## 2016-08-01 LAB — BASIC METABOLIC PANEL
BUN: 11 mg/dL (ref 6–23)
CO2: 31 mEq/L (ref 19–32)
Calcium: 9.2 mg/dL (ref 8.4–10.5)
Chloride: 105 mEq/L (ref 96–112)
Creatinine, Ser: 0.97 mg/dL (ref 0.40–1.50)
GFR: 83.47 mL/min (ref >60.00)
Glucose, Bld: 105 mg/dL — ABNORMAL HIGH (ref 70–99)
Potassium: 5 mEq/L (ref 3.5–5.1)
Sodium: 140 mEq/L (ref 135–145)

## 2016-08-01 LAB — PSA: PSA: 5.01 ng/mL — ABNORMAL HIGH (ref 0.10–4.00)

## 2016-08-01 LAB — LIPID PANEL
Cholesterol: 159 mg/dL (ref 0–200)
HDL: 47 mg/dL (ref >39.00)
LDL Cholesterol: 84 mg/dL (ref 0–99)
NonHDL: 112.06
Total CHOL/HDL Ratio: 3
Triglycerides: 141 mg/dL (ref 0.0–149.0)
VLDL: 28.2 mg/dL (ref 0.0–40.0)

## 2016-08-01 LAB — AST: AST: 53 U/L — ABNORMAL HIGH (ref 0–37)

## 2016-08-01 LAB — ALT: ALT: 80 U/L — ABNORMAL HIGH (ref 0–53)

## 2016-08-01 LAB — HIV ANTIBODY (ROUTINE TESTING W REFLEX): HIV 1&2 Ab, 4th Generation: NONREACTIVE

## 2016-08-03 MED ORDER — ROSUVASTATIN CALCIUM 10 MG PO TABS: 10.0000 mg | ORAL_TABLET | Freq: Every day | ORAL | 2 refills | Status: DC

## 2016-08-03 NOTE — Addendum Note (Signed)
Addended byDamita Dunnings D on: 08/03/2016 04:44 PM   Modules accepted: Orders

## 2016-09-11 ENCOUNTER — Other Ambulatory Visit: Payer: Self-pay | Admitting: Internal Medicine

## 2016-09-12 NOTE — Telephone Encounter (Signed)
Okay 60 and 5

## 2016-09-12 NOTE — Telephone Encounter (Signed)
Rx faxed to [facility].  

## 2016-09-12 NOTE — Telephone Encounter (Signed)
Rx printed, awaiting MD signature.  

## 2016-09-12 NOTE — Telephone Encounter (Signed)
Pt is requesting refill on alprazolam 0.5mg .  Last OV: 06/19/2016 Last Fill: 02/16/2016 #60 and 5RF UDS: 12/20/2015 Low risk  Please advise.

## 2016-10-30 ENCOUNTER — Telehealth: Payer: Self-pay | Admitting: Internal Medicine

## 2016-10-30 NOTE — Telephone Encounter (Signed)
Relation to ZJ:QBHA Call back number: [phone] Pharmacy:  Reason for call:  Patient checking on the status of [facility] Dr. Lesly Dukes due to umbilical hernia, please advise

## 2016-10-30 NOTE — Telephone Encounter (Signed)
Referral Notes  Number of Notes: 3  Type Date User Summary Attachment  General 10/23/2016 4:54 PM Margot Ables - -  Note   Recd call from [facility] stating pt cancelled appts 9/25, 10/3, 10/24 and 11/15. They were all last minute cancellations. They cannot schedule unless Dr. Zella Richer give approval.

## 2016-10-30 NOTE — Telephone Encounter (Signed)
Patient would like to speak with nurse directly, please advise

## 2016-11-14 ENCOUNTER — Encounter: Payer: Self-pay | Admitting: Surgery

## 2016-11-20 ENCOUNTER — Other Ambulatory Visit: Payer: Self-pay

## 2016-11-20 DIAGNOSIS — I739 Peripheral vascular disease, unspecified: Secondary | ICD-10-CM

## 2016-11-26 ENCOUNTER — Ambulatory Visit (HOSPITAL_COMMUNITY)
Admission: RE | Admit: 2016-11-26 | Discharge: 2016-11-26 | Disposition: A | Discharge status: Home / Self Care | Payer: BLUE CROSS/BLUE SHIELD | Source: Ambulatory Visit | Attending: Surgery | Admitting: Surgery

## 2016-11-26 ENCOUNTER — Ambulatory Visit (INDEPENDENT_AMBULATORY_CARE_PROVIDER_SITE_OTHER): Payer: BLUE CROSS/BLUE SHIELD | Admitting: Surgery

## 2016-11-26 ENCOUNTER — Encounter: Payer: Self-pay | Admitting: Surgery

## 2016-11-26 VITALS — BP 163/98 | HR 80 | Ht 69.0 in | Wt 189.9 lb

## 2016-11-26 DIAGNOSIS — I6521 Occlusion and stenosis of right carotid artery: Secondary | ICD-10-CM | POA: Diagnosis not present

## 2016-11-26 DIAGNOSIS — I739 Peripheral vascular disease, unspecified: Secondary | ICD-10-CM | POA: Diagnosis not present

## 2016-11-26 NOTE — Progress Notes (Signed)
[facility] [place]  Patient name: Ryan Smith MRN: [id number] DOB: 09-05-1955 Sex: male   REASON FOR VISIT:    Follow up  HISOTRY OF PRESENT ILLNESS:    Ryan Smith is a 61 y.o. male who I initially saw in 2015 at the request of Dr. Fletcher Anon for a subacute thrombotic occlusion of the right popliteal artery with distal embolization.  The patient was having symptoms of claudication at approximately one half block.  He was initially treated with cilostazol, however he could not tolerate this.  He had a CT scan that was negative for a source of the emboli.  A MRA did not confirm popliteal entrapment syndrome.  We had considered an above-knee to below-knee popliteal bypass graft, however the patient decided not to have an operation.  He returns today to continue these discussions  The patient states that he can walk his dog on a daily basis to 4 total of 40 minutes.  He will get cramping in his calf which resolves with rest.  He is able to tolerate his level of disability.  He does not have any open wounds or rest pain.   PAST MEDICAL HISTORY:   Past Medical History:  Diagnosis Date  . Abnormal LFTs    2008 neg. hep. serology, (-) ANA, ceruloplasmin, etc.;  liver biopsy showed  minimal active inflammation  . Anxiety   . Barrett's esophagus   . Esophageal reflux   . Hearing loss    has hearing aids  . Hiatal hernia   . History of diverticulitis of colon   . Personal history of colonic polyps    tublar adenomas 2008 & hyerplastic 2011  . PVD (peripheral vascular disease) ([place])      FAMILY HISTORY:   Family History  Problem Relation Age of Onset  . Prostate cancer Father 57  . Esophageal cancer Father   . Coronary artery disease Father        dx age 33, CABG  . Heart disease Father   . Heart disease Paternal Uncle        x 2  . Colon cancer Neg Hx   . Diabetes Neg Hx     SOCIAL HISTORY:   Social History    Substance Use Topics  . Smoking status: Current Every Day Smoker    Years: 30.00    Types: Cigarettes  . Smokeless tobacco: Never Used     Comment: QUIT x 2 weeks- 3-4 cigarettes daily  . Alcohol use 12.6 oz/week    21 Glasses of wine per week     Comment: 2-3 glasses wine glasses daily     ALLERGIES:   No Known Allergies   CURRENT MEDICATIONS:   Current Outpatient Prescriptions  Medication Sig Dispense Refill  . ALPRAZolam (XANAX) 0.5 MG tablet Take 1 tablet (0.5 mg total) by mouth 2 (two) times daily. (Patient taking differently: Take 0.5 mg by mouth at bedtime. ) 60 tablet 5  . aspirin EC 81 MG tablet Take 162 mg by mouth daily.     . Coenzyme Q10 10 MG capsule Take 10 mg by mouth daily.    Marland Kitchen DEXILANT 60 MG capsule TAKE 1 CAPSULE (60 MG TOTAL) BY MOUTH DAILY. 30 capsule 6  . Magnesium 500 MG CAPS Take 1 capsule by mouth at bedtime.      . psyllium (METAMUCIL) 58.6 % powder Take 1 packet by mouth at bedtime.     . ranitidine (ZANTAC) 300 MG capsule  TAKE ONE CAPSULE BY MOUTH EVERY EVENING 30 capsule 11  . rosuvastatin (CRESTOR) 10 MG tablet Take 1 tablet (10 mg total) by mouth daily. 90 tablet 2   Current Facility-Administered Medications  Medication Dose Route Frequency Provider Last Rate Last Dose  . 0.9 %  sodium chloride infusion  500 mL Intravenous Continuous Ladene Artist, MD        REVIEW OF SYSTEMS:   [X]  denotes positive finding, [ ]  denotes negative finding Cardiac  Comments:  Chest pain or chest pressure:    Shortness of breath upon exertion:    Short of breath when lying flat:    Irregular heart rhythm:        Vascular    Pain in calf, thigh, or hip brought on by ambulation: x   Pain in feet at night that wakes you up from your sleep:     Blood clot in your veins:    Leg swelling:         Pulmonary    Oxygen at home:    Productive cough:     Wheezing:         Neurologic    Sudden weakness in arms or legs:     Sudden numbness in arms or legs:      Sudden onset of difficulty speaking or slurred speech:    Temporary loss of vision in one eye:     Problems with dizziness:         Gastrointestinal    Blood in stool:     Vomited blood:         Genitourinary    Burning when urinating:     Blood in urine:        Psychiatric    Major depression:         Hematologic    Bleeding problems:    Problems with blood clotting too easily:        Skin    Rashes or ulcers:        Constitutional    Fever or chills:      PHYSICAL EXAM:   Vitals:   11/26/16 0927  BP: (!) 163/98  Pulse: 80  SpO2: 99%  Weight: 189 lb 14.4 oz (86.1 kg)  Height: 5\' 9"  (1.753 m)    GENERAL: The patient is a well-nourished male, in no acute distress. The vital signs are documented above. CARDIAC: There is a regular rate and rhythm.  VASCULAR: Nonpalpable pedal pulses on the right PULMONARY: Non-labored respirations MUSCULOSKELETAL: There are no major deformities or cyanosis. NEUROLOGIC: No focal weakness or paresthesias are detected. SKIN: There are no ulcers or rashes noted. PSYCHIATRIC: The patient has a normal affect.  STUDIES:   Duplex today shows popliteal occlusion with collateralization.  MEDICAL ISSUES:   Right popliteal occlusion: The patient is able to tolerate his level of disability.  We discussed surgical revascularization or potentially percutaneous treatment.  I told the patient given that his level of disability does not significantly hamper his quality of life that I would not recommend any intervention at this time.  He has done well for the past 3 years.  He knows to contact me should he develop worsening symptoms or nonhealing wound.  All of his questions were answered today and he'll contact me for future follow-up.    Annamarie Major, MD [facility] [phone] Pager [phone]

## 2017-01-13 ENCOUNTER — Other Ambulatory Visit: Payer: Self-pay | Admitting: Gastroenterology

## 2017-02-05 ENCOUNTER — Ambulatory Visit (INDEPENDENT_AMBULATORY_CARE_PROVIDER_SITE_OTHER): Payer: BLUE CROSS/BLUE SHIELD | Admitting: Gastroenterology

## 2017-02-05 ENCOUNTER — Encounter: Payer: Self-pay | Admitting: Gastroenterology

## 2017-02-05 VITALS — BP 148/92 | HR 76 | Ht 69.0 in | Wt 191.1 lb

## 2017-02-05 DIAGNOSIS — Z86010 Personal history of colonic polyps: Secondary | ICD-10-CM

## 2017-02-05 DIAGNOSIS — K219 Gastro-esophageal reflux disease without esophagitis: Secondary | ICD-10-CM | POA: Diagnosis not present

## 2017-02-05 DIAGNOSIS — R1110 Vomiting, unspecified: Secondary | ICD-10-CM

## 2017-02-05 DIAGNOSIS — K2270 Barrett's esophagus without dysplasia: Secondary | ICD-10-CM

## 2017-02-05 NOTE — Progress Notes (Signed)
    History of Present Illness: This is a 61 year old male with a history of Barrett's who relates that his current medications are not adequately controlling his reflux symptoms.  His primary symptom is nocturnal regurgitation and he states this happens once or twice per month.  He tries to avoid eating for at least 3 hours prior to bedtime and he uses a wedge pillow regularly.  He has very little heartburn symptoms.  He states a friend of his underwent a LINX procedure with good results and he is interested  Current Medications, Allergies, Past Medical History, Past Surgical History, Family History and Social History were reviewed in [employer] record.  Physical Exam: General: Well developed, well nourished, no acute distress Head: Normocephalic and atraumatic Eyes:  sclerae anicteric, EOMI Ears: Normal auditory acuity Mouth: No deformity or lesions Lungs: Clear throughout to auscultation Heart: Regular rate and rhythm; no murmurs, rubs or bruits Abdomen: Soft, non tender and non distended. No masses, hepatosplenomegaly or hernias noted. Normal Bowel sounds Rectal: not done Musculoskeletal: Symmetrical with no gross deformities  Pulses:  Normal pulses noted Extremities: No clubbing, cyanosis, edema or deformities noted Neurological: Alert oriented x 4, grossly nonfocal Psychological:  Alert and cooperative. Normal mood and affect  Assessment and Recommendations:  1. Barrett's-short segment without dysplasia identified on endoscopy in 2011.  Subsequent endoscopies in 2014 and 2017 have not shown Barrett's on biopsy however esophageal changes consistent with Barrett's were noted. Surveillance EGD in 02/2019. GERD with occasional nocturnal regurgitation.  Continue dexlansoprazole 60 mg daily and ranitidine 300 mg at bedtime.  Follow all standard antireflux measures.  Advised weight loss of 15-20 pounds to help with nocturnal GERD symptoms.  If this is not effective will  further evaluate antireflux procedures that would be appropriate.  REV 1 year.  2.  Personal history of adenomatous colon polyps.  A 5-year interval of surveillance colonoscopy is recommended in November 2022.

## 2017-02-05 NOTE — Patient Instructions (Signed)
Patient advised to avoid spicy, acidic, citrus, chocolate, mints, fruit and fruit juices.  Limit the intake of caffeine, alcohol and Soda.  Don't exercise too soon after eating.  Don't lie down within 3-4 hours of eating.  Elevate the head of your bed.  Thank you for choosing me and [facility].  Malcolm T. Stark, Jr., MD., FACG  

## 2017-02-08 ENCOUNTER — Other Ambulatory Visit: Payer: Self-pay | Admitting: Internal Medicine

## 2017-02-21 ENCOUNTER — Other Ambulatory Visit: Payer: Self-pay | Admitting: Gastroenterology

## 2017-02-24 ENCOUNTER — Other Ambulatory Visit: Payer: Self-pay | Admitting: Gastroenterology

## 2017-03-06 ENCOUNTER — Telehealth: Payer: Self-pay | Admitting: Gastroenterology

## 2017-03-06 NOTE — Telephone Encounter (Signed)
Was there something else this patient needed?  Were you looking into any additional treatments for him?

## 2017-03-07 NOTE — Telephone Encounter (Signed)
I am not aware of any surgeons in our region performing LINX procedure so I do not have any more information about this procedure for him.   Any surgery performed to repair a hiatal hernia and/or treat reflux could make it more difficult to survey his Barrett's esophagus , so he should consider that factor.  If he wishes to pursue LINX or another surgery I would encourage him to go with individuals who have the most experience with the surgery.  Since his reflux symptoms are well controlled on medication I would not encourage him to pursue surgery.

## 2017-03-07 NOTE — Telephone Encounter (Signed)
Patient notified of Dr. Lynne Leader response and recommendations.

## 2017-03-11 ENCOUNTER — Other Ambulatory Visit: Payer: Self-pay | Admitting: Internal Medicine

## 2017-03-13 ENCOUNTER — Other Ambulatory Visit: Payer: Self-pay | Admitting: Internal Medicine

## 2017-03-13 NOTE — Telephone Encounter (Signed)
Rx faxed to [facility] pharmacy- note included on Rx informing Pt he is due for appt.

## 2017-03-13 NOTE — Telephone Encounter (Signed)
Pt is requesting refill on alprazolam 0.5mg .  Last OV: 06/19/2016 Last Fill: 09/12/2016 #60 and 5RF UDS: 12/20/2015 Low risk  [place] website currently down, will try again later.

## 2017-03-13 NOTE — Telephone Encounter (Signed)
Rx printed, awaiting MD signature.  

## 2017-03-13 NOTE — Telephone Encounter (Signed)
Due for office visit, let patient know. Okay to refill 1 month

## 2017-03-23 ENCOUNTER — Other Ambulatory Visit: Payer: Self-pay | Admitting: Internal Medicine

## 2017-03-28 ENCOUNTER — Other Ambulatory Visit: Payer: Self-pay | Admitting: Internal Medicine

## 2017-03-29 ENCOUNTER — Telehealth: Payer: Self-pay | Admitting: Internal Medicine

## 2017-03-29 NOTE — Telephone Encounter (Signed)
Appt made for pt on 04/03/17 in order to get refill on xanax. Pt states he has 2-3 days worth of medication to get him through the weekend. Pt asking if he would be given a refill of Xanax until he is seen on 04/03/17.

## 2017-03-29 NOTE — Telephone Encounter (Signed)
A 30-day supply was sent 03/13/2017.  Too rarely.

## 2017-03-29 NOTE — Telephone Encounter (Signed)
A 60 day supply of Xanax was sent to Pt's pharmacy on 03/13/2017- he should have enough through 04/12/2017.

## 2017-03-29 NOTE — Telephone Encounter (Signed)
Left message on patients answering machine that we would not be able to fill medication he requested because a refill was recently sent on 03/13/17. Advised patient to check wit pharmacy for this RX if he had not already.

## 2017-03-29 NOTE — Telephone Encounter (Signed)
Copied from [place] [phone]. Topic: Quick Communication - See Telephone Encounter >> Mar 29, 2017  1:15 PM Burnis Medin, NT wrote: CRM for notification. See Telephone encounter for: Pt called and said pt requested a refill at the pharmacy for  ALPRAZolam Duanne Moron) 0.5 MG tablet and it was denied. Pt would like a call back wondering why.         03/29/17.

## 2017-04-03 ENCOUNTER — Ambulatory Visit: Payer: BLUE CROSS/BLUE SHIELD | Admitting: Internal Medicine

## 2017-04-22 ENCOUNTER — Ambulatory Visit: Payer: BLUE CROSS/BLUE SHIELD | Admitting: Internal Medicine

## 2017-04-22 ENCOUNTER — Encounter: Payer: Self-pay | Admitting: Internal Medicine

## 2017-04-22 VITALS — BP 128/80 | HR 98 | Temp 98.0°F | Resp 14 | Ht 69.0 in | Wt 194.4 lb

## 2017-04-22 DIAGNOSIS — Z79899 Other long term (current) drug therapy: Secondary | ICD-10-CM

## 2017-04-22 DIAGNOSIS — K219 Gastro-esophageal reflux disease without esophagitis: Secondary | ICD-10-CM

## 2017-04-22 DIAGNOSIS — G4700 Insomnia, unspecified: Secondary | ICD-10-CM | POA: Diagnosis not present

## 2017-04-22 DIAGNOSIS — R635 Abnormal weight gain: Secondary | ICD-10-CM

## 2017-04-22 DIAGNOSIS — F411 Generalized anxiety disorder: Secondary | ICD-10-CM

## 2017-04-22 NOTE — Progress Notes (Signed)
Subjective:    Patient ID: Ryan Smith, male    DOB: 06/03/1955, 62 y.o.   MRN: [id number]  DOS:  04/22/2017 Type of visit - description : rov Interval history: In general feeling well. Since the last office visit, he quit tobacco.  Has gained wt ~ 20 lb H/o GERD,  closely follow-up by GI.  Was seen by urology, prostate biopsy, negative.  Review of Systems   Past Medical History:  Diagnosis Date  . Abnormal LFTs    2008 neg. hep. serology, (-) ANA, ceruloplasmin, etc.;  liver biopsy showed  minimal active inflammation  . Anxiety   . Barrett's esophagus   . Elevated PSA   . Esophageal reflux   . Hearing loss    has hearing aids  . Hiatal hernia   . History of diverticulitis of colon   . Personal history of colonic polyps    tublar adenomas 2008 & hyerplastic 2011  . PVD (peripheral vascular disease) ([place])     Past Surgical History:  Procedure Laterality Date  . ABDOMINAL AORTAGRAM N/A 11/25/2013   Procedure: ABDOMINAL Maxcine Ham;  Surgeon: Wellington Hampshire, MD;  Location: [facility];  Service: Cardiovascular;  Laterality: N/A;  . Laparoscopically-assisted Sigmoid Colectomy  11/10/2007  . LIVER BIOPSY     minimal active inflammation    Social History   Socioeconomic History  . Marital status: Married    Spouse name: Not on file  . Number of children: 2  . Years of education: Not on file  . Highest education level: Not on file  Social Needs  . Financial resource strain: Not on file  . Food insecurity - worry: Not on file  . Food insecurity - inability: Not on file  . Transportation needs - medical: Not on file  . Transportation needs - non-medical: Not on file  Occupational History  . Occupation: Therapist, music, [employer]    Employer: [employer]  Tobacco Use  . Smoking status: Former Smoker    Years: 30.00    Types: Cigarettes  . Smokeless tobacco: Never Used  . Tobacco comment: [phone]  Substance and Sexual Activity  . Alcohol use: Yes   Alcohol/week: 12.6 oz    Types: 21 Glasses of wine per week    Comment: 2-3 glasses wine glasses daily  . Drug use: No  . Sexual activity: Not on file  Other Topics Concern  . Not on file  Social History Narrative   Born in [place]   Married, 2 step sons (adults)      Allergies as of 04/22/2017   No Known Allergies     Medication List        Accurate as of 04/22/17  5:59 PM. Always use your most recent med list.          ALPRAZolam 0.5 MG tablet Commonly known as:  XANAX Take 1 tablet (0.5 mg total) by mouth 2 (two) times daily.   aspirin EC 81 MG tablet Take 162 mg by mouth daily.   Coenzyme Q10 10 MG capsule Take 10 mg by mouth daily.   DEXILANT 60 MG capsule Generic drug:  dexlansoprazole TAKE 1 CAPSULE BY MOUTH EVERY DAY. NEED APT FOR REFILLS   Magnesium 500 MG Caps Take 1 capsule by mouth at bedtime.   psyllium 58.6 % powder Commonly known as:  METAMUCIL Take 1 packet by mouth at bedtime.   ranitidine 300 MG capsule Commonly known as:  ZANTAC TAKE ONE CAPSULE BY MOUTH EVERY  EVENING   rosuvastatin 10 MG tablet Commonly known as:  CRESTOR Take 1 tablet (10 mg total) by mouth daily.          Objective:   Physical Exam BP 128/80 (BP Location: Left Arm, Patient Position: Sitting, Cuff Size: Normal)   Pulse 98   Temp 98 F (36.7 C) (Oral)   Resp 14   Ht 5\' 9"  (1.753 m)   Wt 194 lb 6 oz (88.2 kg)   SpO2 96%   BMI 28.70 kg/m  General:   Well developed, well nourished . NAD.  HEENT:  Normocephalic . Face symmetric, atraumatic Lungs:  CTA B Normal respiratory effort, no intercostal retractions, no accessory muscle use. Heart: RRR,  no murmur.  No pretibial edema bilaterally  Skin: Not pale. Not jaundice Neurologic:  alert & oriented X3.  Speech normal, gait appropriate for age and unassisted Psych--  Cognition and judgment appear intact.  Cooperative with normal attention span and concentration.  Behavior appropriate. No anxious  or depressed appearing.      Assessment & Plan:   Assessment Anxiety Hyperlipidemia  PVD R leg, dx 2015,   claudication 10-2013, ABIs showed obstruction, saw [name], had a aortogram , was referred to surgery. They rx a CT chest show mild atherosclerosis disease of the aorta, coronary arthrosclerosis. Had a MRI of the right leg, see report. Was recommended a bypass but sx  improved, did not proceed. No benefit from cilostazol GI:  --Barrett's, GERD, last EGD,BX : 02-2016  --Abnormal LFTs: 2008 neg. hep. serology, (-) ANA, ceruloplasmin, etc.;  liver bx:  minimal active inflammation Hearing loss, has aids  Sees dermatology q 6 ,months as off 12-2015 Stress 12-2013: Low risk Prostate BX 12-2016 (-)  PLAN: Weight gain: Extensive discussion about diet, encourage calorie counting. hyperlipidemia: Well-controlled, last FLP satisfactory Increased PSA, s/p BX  12-2016: Negative. Anxiety: Well-controlled Xanax, UDS and contract today GERD: Closely follow-up by GI Quit tobacco: praised! RTC 4 months, fasting.

## 2017-04-22 NOTE — Assessment & Plan Note (Addendum)
Weight gain: Extensive discussion about diet, encourage calorie counting. hyperlipidemia: Well-controlled, last FLP satisfactory Increased PSA, s/p BX  12-2016: Negative. Anxiety: Well-controlled Xanax, UDS and contract today GERD: Closely follow-up by GI Quit tobacco: praised! RTC 4 months, fasting.

## 2017-04-22 NOTE — Progress Notes (Signed)
Pre visit review using our clinic review tool, if applicable. No additional management support is needed unless otherwise documented below in the visit note. 

## 2017-04-22 NOTE — Patient Instructions (Addendum)
GO TO THE LAB : Urine sample   GO TO THE FRONT DESK Schedule your next appointment for a physical exam in 4 months, fasting  Consider calorie counting, MYFITNESSPAL ?  GENERAL INFORMATION ABOUT HEALTHY EATING, CHOLESTEROL, HIGH BLOOD PRESSURE: The American Heart Association, www.heart.org  Check the "Life's Simple 7" from the [facility] "[place] [facility] [place]" book

## 2017-04-26 LAB — PAIN MGMT, PROFILE 8 W/CONF, U
6 Acetylmorphine: NEGATIVE ng/mL (ref <10)
Alcohol Metabolites: POSITIVE ng/mL — AB (ref <500)
Alphahydroxyalprazolam: 88 ng/mL — ABNORMAL HIGH (ref <25)
Alphahydroxymidazolam: NEGATIVE ng/mL (ref <50)
Alphahydroxytriazolam: NEGATIVE ng/mL (ref <50)
Aminoclonazepam: NEGATIVE ng/mL (ref <25)
Amphetamines: NEGATIVE ng/mL (ref <500)
Benzodiazepines: POSITIVE ng/mL — AB (ref <100)
Buprenorphine, Urine: NEGATIVE ng/mL (ref <5)
Cocaine Metabolite: NEGATIVE ng/mL (ref <150)
Creatinine: 57.8 mg/dL
Ethyl Glucuronide (ETG): 2573 ng/mL — ABNORMAL HIGH (ref <500)
Ethyl Sulfate (ETS): 806 ng/mL — ABNORMAL HIGH (ref <100)
Hydroxyethylflurazepam: NEGATIVE ng/mL (ref <50)
Lorazepam: NEGATIVE ng/mL (ref <50)
MDMA: NEGATIVE ng/mL (ref <500)
Marijuana Metabolite: NEGATIVE ng/mL (ref <20)
Nordiazepam: NEGATIVE ng/mL (ref <50)
Opiates: NEGATIVE ng/mL (ref <100)
Oxazepam: NEGATIVE ng/mL (ref <50)
Oxidant: NEGATIVE ug/mL (ref <200)
Oxycodone: NEGATIVE ng/mL (ref <100)
Temazepam: NEGATIVE ng/mL (ref <50)
pH: 7.64 (ref 4.5–9.0)

## 2017-05-01 ENCOUNTER — Telehealth: Payer: Self-pay | Admitting: Internal Medicine

## 2017-05-02 NOTE — Telephone Encounter (Signed)
sent 

## 2017-05-02 NOTE — Telephone Encounter (Signed)
Pt is requesting refill on alprazolam.   Last OV: 04/22/2017 Last Fill: 03/13/2017 #60 and 0RF UDS: 04/22/2017 low risk  NCCR printed- no issues noted.    Please advise.

## 2017-07-03 ENCOUNTER — Telehealth: Payer: Self-pay | Admitting: Internal Medicine

## 2017-07-03 NOTE — Telephone Encounter (Signed)
Sent!

## 2017-07-03 NOTE — Telephone Encounter (Signed)
Pt is requesting refill on alprazolam 0.5mg .   Last OV: 04/22/2017 Last Fill: 05/02/2017 #60 and 1RF UDS: 04/22/2017 Low risk  NCCR in media from 05/14/2017- no discrepancies noted  Please advise.

## 2017-08-30 ENCOUNTER — Encounter: Payer: Self-pay | Admitting: Internal Medicine

## 2017-08-30 ENCOUNTER — Ambulatory Visit (INDEPENDENT_AMBULATORY_CARE_PROVIDER_SITE_OTHER): Payer: BLUE CROSS/BLUE SHIELD | Admitting: Internal Medicine

## 2017-08-30 VITALS — BP 124/74 | HR 78 | Temp 98.0°F | Resp 16 | Ht 69.0 in | Wt 189.4 lb

## 2017-08-30 DIAGNOSIS — F411 Generalized anxiety disorder: Secondary | ICD-10-CM | POA: Diagnosis not present

## 2017-08-30 DIAGNOSIS — Z0000 Encounter for general adult medical examination without abnormal findings: Secondary | ICD-10-CM | POA: Diagnosis not present

## 2017-08-30 DIAGNOSIS — E785 Hyperlipidemia, unspecified: Secondary | ICD-10-CM | POA: Diagnosis not present

## 2017-08-30 DIAGNOSIS — I739 Peripheral vascular disease, unspecified: Secondary | ICD-10-CM | POA: Diagnosis not present

## 2017-08-30 LAB — CBC WITH DIFFERENTIAL/PLATELET
Basophils Absolute: 0 10*3/uL (ref 0.0–0.1)
Basophils Relative: 0.6 % (ref 0.0–3.0)
Eosinophils Absolute: 0.2 10*3/uL (ref 0.0–0.7)
Eosinophils Relative: 2.1 % (ref 0.0–5.0)
HCT: 49.4 % (ref 39.0–52.0)
Hemoglobin: 16.7 g/dL (ref 13.0–17.0)
Lymphocytes Relative: 33.7 % (ref 12.0–46.0)
Lymphs Abs: 2.6 10*3/uL (ref 0.7–4.0)
MCHC: 33.7 g/dL (ref 30.0–36.0)
MCV: 94 fl (ref 78.0–100.0)
Monocytes Absolute: 0.8 10*3/uL (ref 0.1–1.0)
Monocytes Relative: 10 % (ref 3.0–12.0)
Neutro Abs: 4.1 10*3/uL (ref 1.4–7.7)
Neutrophils Relative %: 53.6 % (ref 43.0–77.0)
Platelets: 350 10*3/uL (ref 150.0–400.0)
RBC: 5.25 Mil/uL (ref 4.22–5.81)
RDW: 13.9 % (ref 11.5–15.5)
WBC: 7.6 10*3/uL (ref 4.0–10.5)

## 2017-08-30 LAB — COMPREHENSIVE METABOLIC PANEL
ALT: 59 U/L — ABNORMAL HIGH (ref 0–53)
AST: 35 U/L (ref 0–37)
Albumin: 4.6 g/dL (ref 3.5–5.2)
Alkaline Phosphatase: 85 U/L (ref 39–117)
BUN: 12 mg/dL (ref 6–23)
CO2: 29 mEq/L (ref 19–32)
Calcium: 9.8 mg/dL (ref 8.4–10.5)
Chloride: 104 mEq/L (ref 96–112)
Creatinine, Ser: 0.9 mg/dL (ref 0.40–1.50)
GFR: 90.69 mL/min (ref >60.00)
Glucose, Bld: 103 mg/dL — ABNORMAL HIGH (ref 70–99)
Potassium: 5 mEq/L (ref 3.5–5.1)
Sodium: 141 mEq/L (ref 135–145)
Total Bilirubin: 0.6 mg/dL (ref 0.2–1.2)
Total Protein: 7.2 g/dL (ref 6.0–8.3)

## 2017-08-30 LAB — LIPID PANEL
Cholesterol: 176 mg/dL (ref 0–200)
HDL: 42.6 mg/dL (ref >39.00)
LDL Cholesterol: 107 mg/dL — ABNORMAL HIGH (ref 0–99)
NonHDL: 133.67
Total CHOL/HDL Ratio: 4
Triglycerides: 133 mg/dL (ref 0.0–149.0)
VLDL: 26.6 mg/dL (ref 0.0–40.0)

## 2017-08-30 LAB — HEMOGLOBIN A1C: Hgb A1c MFr Bld: 5.7 % (ref 4.6–6.5)

## 2017-08-30 NOTE — Progress Notes (Signed)
Subjective:    Patient ID: Ryan Smith, male    DOB: Jun 23, 1955, 62 y.o.   MRN: [id number]  DOS:  08/30/2017 Type of visit - description : cpx Interval history: No major concerns.  Retired 04-2016. He remains active, walks the dog twice a day,  about a mile, no claudication, chest pain or difficulty breathing. He plays golf regularly.  Wt Readings from Last 3 Encounters:  08/30/17 189 lb 6 oz (85.9 kg)  04/22/17 194 lb 6 oz (88.2 kg)  02/05/17 191 lb 2 oz (86.7 kg)     Review of Systems  Other than above, a 14 point review of systems is negative      Past Medical History:  Diagnosis Date  . Abnormal LFTs    2008 neg. hep. serology, (-) ANA, ceruloplasmin, etc.;  liver biopsy showed  minimal active inflammation  . Anxiety   . Barrett's esophagus   . Elevated PSA   . Esophageal reflux   . Hearing loss    has hearing aids  . Hiatal hernia   . History of diverticulitis of colon   . Personal history of colonic polyps    tublar adenomas 2008 & hyerplastic 2011  . PVD (peripheral vascular disease) ([place])     Past Surgical History:  Procedure Laterality Date  . ABDOMINAL AORTAGRAM N/A 11/25/2013   Procedure: ABDOMINAL Maxcine Ham;  Surgeon: Wellington Hampshire, MD;  Location: [facility];  Service: Cardiovascular;  Laterality: N/A;  . Laparoscopically-assisted Sigmoid Colectomy  11/10/2007  . LIVER BIOPSY     minimal active inflammation    Social History   Socioeconomic History  . Marital status: Married    Spouse name: Not on file  . Number of children: 2  . Years of education: Not on file  . Highest education level: Not on file  Occupational History  . 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 Financial resource strain: Not on file  . Food insecurity:    Worry: Not on file    Inability: Not on file  . Transportation needs:    Medical: Not on file    Non-medical: Not on file  Tobacco Use  . Smoking  status: Current Every Day Smoker    Years: 40.00    Types: Cigarettes  . Smokeless tobacco: Never Used  . Tobacco comment: 1/4 ppd x 40 y, now smokes rarely   Substance and Sexual Activity  . Alcohol use: Yes    Alcohol/week: 12.6 oz    Types: 21 Glasses of wine per week    Comment: 2-3 glasses wine glasses daily  . Drug use: No  . Sexual activity: Not on file  Lifestyle  . Physical activity:    Days per week: Not on file    Minutes per session: Not on file  . Stress: Not on file  Relationships  . Social connections:    Talks on phone: Not on file    Gets together: Not on file    Attends religious service: Not on file    Active member of club or organization: Not on file    Attends meetings of clubs or organizations: Not on file    Relationship status: Not on file  . Intimate partner violence:    Fear of current or ex partner: Not on file    Emotionally abused: Not on file    Physically abused: Not on file    Forced sexual activity:  Not on file  Other Topics Concern  . Not on file  Social History Narrative   Born in [place]   Married, 2 step sons (adults)     Family History  Problem Relation Age of Onset  . Prostate cancer Father 71  . Esophageal cancer Father   . Coronary artery disease Father        dx age 31, CABG  . Heart disease Father   . Heart disease Paternal Uncle        x 2  . Colon cancer Neg Hx   . Diabetes Neg Hx      Allergies as of 08/30/2017   No Known Allergies     Medication List        Accurate as of 08/30/17 11:59 PM. Always use your most recent med list.          ALPRAZolam 0.5 MG tablet Commonly known as:  XANAX Take 1 tablet (0.5 mg total) by mouth 2 (two) times daily.   aspirin EC 81 MG tablet Take 162 mg by mouth daily.   Coenzyme Q10 10 MG capsule Take 10 mg by mouth daily.   DEXILANT 60 MG capsule Generic drug:  dexlansoprazole TAKE 1 CAPSULE BY MOUTH EVERY DAY. NEED APT FOR REFILLS   Magnesium 500 MG  Caps Take 1 capsule by mouth at bedtime.   psyllium 58.6 % powder Commonly known as:  METAMUCIL Take 1 packet by mouth at bedtime.   ranitidine 300 MG capsule Commonly known as:  ZANTAC TAKE ONE CAPSULE BY MOUTH EVERY EVENING   rosuvastatin 10 MG tablet Commonly known as:  CRESTOR Take 1 tablet (10 mg total) by mouth daily.          Objective:   Physical Exam BP 124/74 (BP Location: Left Arm, Patient Position: Sitting, Cuff Size: Small)   Pulse 78   Temp 98 F (36.7 C) (Oral)   Resp 16   Ht 5\' 9"  (1.753 m)   Wt 189 lb 6 oz (85.9 kg)   SpO2 97%   BMI 27.97 kg/m  General:   Well developed, well nourished . NAD.  Neck: No  thyromegaly  HEENT:  Normocephalic . Face symmetric, atraumatic Lungs:  CTA B Normal respiratory effort, no intercostal retractions, no accessory muscle use. Heart: RRR,  no murmur.  No pretibial edema bilaterally  Abdomen:  Not distended, soft, non-tender. No rebound or rigidity.   Skin: Exposed areas without rash. Not pale. Not jaundice Neurologic:  alert & oriented X3.  Speech normal, gait appropriate for age and unassisted Strength symmetric and appropriate for age.  Psych: Cognition and judgment appear intact.  Cooperative with normal attention span and concentration.  Behavior appropriate. No anxious or depressed appearing.     Assessment & Plan:    Assessment Anxiety Hyperlipidemia  PVD R leg, dx 2015,   claudication 10-2013, ABIs showed obstruction, saw [name], had a aortogram , was referred to surgery. They rx a CT chest show mild atherosclerosis disease of the aorta, coronary arthrosclerosis. Had a MRI of the right leg, see report. Was recommended a bypass but sx  improved, did not proceed. No benefit from cilostazol GI:  --Barrett's, GERD, last EGD,BX : 02-2016  --Abnormal LFTs: 2008 neg. hep. serology, (-) ANA, ceruloplasmin, etc.;  liver bx:  minimal active inflammation Hearing loss, has aids  Sees dermatology q 6  ,months as off 12-2015 Stress 12-2013: Low risk Prostate BX 12-2016 (-)  PLAN: Anxiety: Controlled on Xanax. Hyperlipidemia:  On Crestor, checking labs PVD: Asymptomatic, able to walk 1 mile twice a day without symptoms. RTC 8 months

## 2017-08-30 NOTE — Assessment & Plan Note (Addendum)
--  Td  05-2016 ; pnm 23  2016;  shingrix not available  --CCS: last colonoscopy 10/2009, polyp,   cscope 02-2016: + Polyps, 5 years. --Prostate cancer screening: 12-2016 prostate bx (-)  --Exercise-diet: improved diet, very active --Tobacco: Currently smoking to 3 cigarettes a day, planning to quit. --Lung cancer screening: Smoke 0.25 PPD for 40 years, officially does not qualify under Medicare guidelines but he could still get a CT out-of-pocket: declined for now; also d/w pt chantix-wellbutrin --Labs: CMP, FLP, CBC, A1c.

## 2017-08-30 NOTE — Patient Instructions (Signed)
GO TO THE LAB : Get the blood work     GO TO THE FRONT DESK Schedule your next appointment for a  routine checkup in 8 months  

## 2017-08-30 NOTE — Progress Notes (Signed)
Pre visit review using our clinic review tool, if applicable. No additional management support is needed unless otherwise documented below in the visit note. 

## 2017-08-31 NOTE — Assessment & Plan Note (Signed)
Anxiety: Controlled on Xanax. Hyperlipidemia: On Crestor, checking labs PVD: Asymptomatic, able to walk 1 mile twice a day without symptoms. RTC 8 months

## 2017-09-01 ENCOUNTER — Encounter: Payer: Self-pay | Admitting: Internal Medicine

## 2017-09-02 MED ORDER — ROSUVASTATIN CALCIUM 20 MG PO TABS: 20.0000 mg | ORAL_TABLET | Freq: Every day | ORAL | 2 refills | Status: DC

## 2017-09-02 NOTE — Addendum Note (Signed)
Addended byDamita Dunnings D on: 09/02/2017 07:43 AM   Modules accepted: Orders

## 2017-09-10 ENCOUNTER — Telehealth: Payer: Self-pay | Admitting: Internal Medicine

## 2017-09-10 NOTE — Telephone Encounter (Signed)
Pt is requesting refill on alprazolam.   Last OV: 08/30/2017 Last Fill: 07/03/2017 #60 and 1RF UDS: 04/22/2017 Low risk  Please advise.

## 2017-09-10 NOTE — Telephone Encounter (Signed)
Sent , NCCR was checked recently

## 2017-09-10 NOTE — Telephone Encounter (Signed)
NCCR in media- no discrepancies  

## 2017-11-19 ENCOUNTER — Telehealth: Payer: Self-pay | Admitting: Internal Medicine

## 2017-11-20 NOTE — Telephone Encounter (Signed)
Refill request for alprazolam.   Last OV: 08/30/2017 Last Fill: 09/10/2017 #60 and 1RF UDS: 04/22/2017 Low risk  NCCR printed- no discrepancies noted- sent for scanning

## 2017-11-20 NOTE — Telephone Encounter (Signed)
Sent!

## 2017-11-28 ENCOUNTER — Other Ambulatory Visit: Payer: Self-pay | Admitting: Internal Medicine

## 2017-12-08 ENCOUNTER — Encounter: Payer: Self-pay | Admitting: Internal Medicine

## 2018-01-20 ENCOUNTER — Telehealth: Payer: Self-pay | Admitting: Gastroenterology

## 2018-01-20 ENCOUNTER — Other Ambulatory Visit: Payer: Self-pay | Admitting: Gastroenterology

## 2018-01-20 MED ORDER — FAMOTIDINE 40 MG PO TABS: 40.0000 mg | ORAL_TABLET | Freq: Every day | ORAL | 0 refills | Status: DC

## 2018-01-20 NOTE — Telephone Encounter (Signed)
Left a message for patient stating we will send in famotodine 40 mg daily in the place of ranitidine. Also to call back if he has any questions.

## 2018-02-02 ENCOUNTER — Other Ambulatory Visit: Payer: Self-pay | Admitting: Gastroenterology

## 2018-02-11 ENCOUNTER — Other Ambulatory Visit: Payer: Self-pay | Admitting: Gastroenterology

## 2018-03-11 ENCOUNTER — Other Ambulatory Visit: Payer: Self-pay | Admitting: Gastroenterology

## 2018-04-11 ENCOUNTER — Other Ambulatory Visit: Payer: Self-pay | Admitting: Gastroenterology

## 2018-04-16 ENCOUNTER — Encounter: Payer: Self-pay | Admitting: Internal Medicine

## 2018-04-17 ENCOUNTER — Other Ambulatory Visit: Payer: Self-pay | Admitting: Gastroenterology

## 2018-04-18 ENCOUNTER — Other Ambulatory Visit: Payer: Self-pay | Admitting: Gastroenterology

## 2018-04-23 ENCOUNTER — Telehealth: Payer: Self-pay | Admitting: *Deleted

## 2018-04-23 NOTE — Telephone Encounter (Signed)
Received Dermatopathology Report results from [facility]; forwarded to provider/SLS 01/22

## 2018-04-25 ENCOUNTER — Other Ambulatory Visit: Payer: Self-pay | Admitting: Gastroenterology

## 2018-04-25 ENCOUNTER — Other Ambulatory Visit: Payer: Self-pay

## 2018-04-25 MED ORDER — FAMOTIDINE 20 MG PO TABS: 40.0000 mg | ORAL_TABLET | Freq: Every day | ORAL | 1 refills | Status: DC

## 2018-04-25 NOTE — Telephone Encounter (Signed)
Received fax that pepcid 40 mg is on back order. New prescription for pepcid 20 mg 2 tablets by mouth daily has been sent to the pharmacy.

## 2018-04-28 ENCOUNTER — Other Ambulatory Visit: Payer: Self-pay

## 2018-04-28 MED ORDER — FAMOTIDINE 40 MG PO TABS: 40.0000 mg | ORAL_TABLET | Freq: Every day | ORAL | 0 refills | Status: DC

## 2018-04-29 ENCOUNTER — Other Ambulatory Visit: Payer: Self-pay | Admitting: Gastroenterology

## 2018-04-29 ENCOUNTER — Other Ambulatory Visit: Payer: Self-pay

## 2018-04-29 MED ORDER — FAMOTIDINE 40 MG PO TABS: 40.0000 mg | ORAL_TABLET | Freq: Every day | ORAL | 0 refills | Status: DC

## 2018-05-23 ENCOUNTER — Telehealth: Payer: Self-pay | Admitting: Internal Medicine

## 2018-05-23 NOTE — Telephone Encounter (Signed)
Refill request for alprazolam.   Last OV: 08/30/2017 Last Fill: 11/20/2017 #60 and 4rf UDS: 04/22/2017 Low risk

## 2018-05-23 NOTE — Telephone Encounter (Signed)
Sent , has a f/u 06/2018

## 2018-06-04 ENCOUNTER — Encounter: Payer: Self-pay | Admitting: Internal Medicine

## 2018-06-04 ENCOUNTER — Other Ambulatory Visit: Payer: Self-pay

## 2018-06-04 ENCOUNTER — Ambulatory Visit (INDEPENDENT_AMBULATORY_CARE_PROVIDER_SITE_OTHER): Payer: BLUE CROSS/BLUE SHIELD | Admitting: Internal Medicine

## 2018-06-04 VITALS — BP 140/90 | HR 85 | Temp 98.2°F | Resp 18 | Ht 69.0 in | Wt 192.4 lb

## 2018-06-04 DIAGNOSIS — R972 Elevated prostate specific antigen [PSA]: Secondary | ICD-10-CM | POA: Diagnosis not present

## 2018-06-04 DIAGNOSIS — F411 Generalized anxiety disorder: Secondary | ICD-10-CM | POA: Diagnosis not present

## 2018-06-04 DIAGNOSIS — E785 Hyperlipidemia, unspecified: Secondary | ICD-10-CM

## 2018-06-04 DIAGNOSIS — Z79899 Other long term (current) drug therapy: Secondary | ICD-10-CM | POA: Diagnosis not present

## 2018-06-04 LAB — LIPID PANEL
Cholesterol: 169 mg/dL (ref 0–200)
HDL: 43.5 mg/dL (ref >39.00)
LDL Cholesterol: 88 mg/dL (ref 0–99)
NonHDL: 125.41
Total CHOL/HDL Ratio: 4
Triglycerides: 186 mg/dL — ABNORMAL HIGH (ref 0.0–149.0)
VLDL: 37.2 mg/dL (ref 0.0–40.0)

## 2018-06-04 LAB — PSA: PSA: 4.85 ng/mL — ABNORMAL HIGH (ref 0.10–4.00)

## 2018-06-04 LAB — AST: AST: 41 U/L — ABNORMAL HIGH (ref 0–37)

## 2018-06-04 LAB — ALT: ALT: 66 U/L — ABNORMAL HIGH (ref 0–53)

## 2018-06-04 NOTE — Progress Notes (Signed)
Pre visit review using our clinic review tool, if applicable. No additional management support is needed unless otherwise documented below in the visit note. 

## 2018-06-04 NOTE — Progress Notes (Signed)
Subjective:    Patient ID: Ryan Smith, male    DOB: 28-Apr-1955, 63 y.o.   MRN: [id number]  DOS:  06/04/2018 Type of visit - description: Follow-up Anxiety: On Xanax.  Needs a UDS GERD: Still has a sporadic symptoms.  Good compliance to medication. High cholesterol: Based on last FLP, Crestor dose increased.  No apparent side effects  Review of Systems Denies dysuria, gross hematuria or difficulty urinating   Past Medical History:  Diagnosis Date  . Abnormal LFTs    2008 neg. hep. serology, (-) ANA, ceruloplasmin, etc.;  liver biopsy showed  minimal active inflammation  . Anxiety   . Barrett's esophagus   . Elevated PSA   . Esophageal reflux   . Hearing loss    has hearing aids  . Hiatal hernia   . History of diverticulitis of colon   . Personal history of colonic polyps    tublar adenomas 2008 & hyerplastic 2011  . PVD (peripheral vascular disease) ([place])   . SCC (squamous cell carcinoma)    L shoulder    Past Surgical History:  Procedure Laterality Date  . ABDOMINAL AORTAGRAM N/A 11/25/2013   Procedure: ABDOMINAL Maxcine Ham;  Surgeon: Wellington Hampshire, MD;  Location: [facility];  Service: Cardiovascular;  Laterality: N/A;  . Laparoscopically-assisted Sigmoid Colectomy  11/10/2007  . LIVER BIOPSY     minimal active inflammation    Social History   Socioeconomic History  . Marital status: Married    Spouse name: Not on file  . Number of children: 2  . Years of education: Not on file  . Highest education level: Not on file  Occupational History  . 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 Financial resource strain: Not on file  . Food insecurity:    Worry: Not on file    Inability: Not on file  . Transportation needs:    Medical: Not on file    Non-medical: Not on file  Tobacco Use  . Smoking status: Current Some Day Smoker    Years: 40.00    Types: Cigarettes  . Smokeless tobacco: Never Used  .  Tobacco comment: 1/4 ppd x 40 y, now smokes rarely   Substance and Sexual Activity  . Alcohol use: Yes    Alcohol/week: 21.0 standard drinks    Types: 21 Glasses of wine per week    Comment: 2-3 glasses wine glasses daily  . Drug use: No  . Sexual activity: Not on file  Lifestyle  . Physical activity:    Days per week: Not on file    Minutes per session: Not on file  . Stress: Not on file  Relationships  . Social connections:    Talks on phone: Not on file    Gets together: Not on file    Attends religious service: Not on file    Active member of club or organization: Not on file    Attends meetings of clubs or organizations: Not on file    Relationship status: Not on file  . Intimate partner violence:    Fear of current or ex partner: Not on file    Emotionally abused: Not on file    Physically abused: Not on file    Forced sexual activity: Not on file  Other Topics Concern  . Not on file  Social History Narrative   Born in [place]   Married, 2 step sons (adults)  Allergies as of 06/04/2018   No Known Allergies     Medication List       Accurate as of June 04, 2018 11:05 AM. Always use your most recent med list.        ALPRAZolam 0.5 MG tablet Commonly known as:  XANAX Take 1 tablet (0.5 mg total) by mouth 2 (two) times daily.   aspirin EC 81 MG tablet Take 162 mg by mouth daily.   Coenzyme Q10 10 MG capsule Take 10 mg by mouth daily.   DEXILANT 60 MG capsule Generic drug:  dexlansoprazole TAKE 1 CAPSULE BY MOUTH EVERY DAY. NEED APT FOR REFILLS   famotidine 40 MG tablet Commonly known as:  PEPCID Take 1 tablet (40 mg total) by mouth at bedtime.   Magnesium 500 MG Caps Take 1 capsule by mouth at bedtime.   psyllium 58.6 % powder Commonly known as:  METAMUCIL Take 1 packet by mouth at bedtime.   rosuvastatin 20 MG tablet Commonly known as:  CRESTOR TAKE 1 TABLET BY MOUTH EVERY DAY           Objective:   Physical Exam BP 140/90  (BP Location: Left Arm, Patient Position: Sitting, Cuff Size: Large)   Pulse 85   Temp 98.2 F (36.8 C) (Oral)   Resp 18   Ht 5\' 9"  (1.753 m)   Wt 192 lb 6.4 oz (87.3 kg)   SpO2 97%   BMI 28.41 kg/m  General:   Well developed, NAD, BMI noted. HEENT:  Normocephalic . Face symmetric, atraumatic Lungs:  CTA B Normal respiratory effort, no intercostal retractions, no accessory muscle use. Heart: RRR,  no murmur.  No pretibial edema bilaterally  Skin: Not pale. Not jaundice DRE: Normal sphincter tone, no stools found, prostate with minimal enlargement, no nodules, not tender. Neurologic:  alert & oriented X3.  Speech normal, gait appropriate for age and unassisted Psych--  Cognition and judgment appear intact.  Cooperative with normal attention span and concentration.  Behavior appropriate. No anxious or depressed appearing.      Assessment     Assessment Anxiety Hyperlipidemia  PVD R leg, dx 2015,   claudication 10-2013, ABIs showed obstruction, saw [name], had a aortogram , was referred to surgery. They rx a CT chest show mild atherosclerosis disease of the aorta, coronary arthrosclerosis. Had a MRI of the right leg, see report. Was recommended a bypass but sx  improved, did not proceed. No benefit from cilostazol GI:  --Barrett's, GERD, last EGD,BX : 02-2016  --Abnormal LFTs: 2008 neg. hep. serology, (-) ANA, ceruloplasmin, etc.;  liver bx:  minimal active inflammation Hearing loss, has aids  Sees dermatology q 6 ,months as off 12-2015 Stress 12-2013: Low risk Prostate BX 12-2016 (-)  PLAN: Anxiety: On Xanax as needed, takes mostly at night, check a UDS. Hyperlipidemia: Based on last FLP, Crestor dose increased.  Good compliance.  Check a lipid panel and liver test. GERD: Still has occasional symptoms, good compliance with meds, plans to discuss with GI. Elevated PSA: Negative biopsy 12-2016, has not seen urology again, DRE show enlarged prostate but nothing else.   Check a PSA.  Reports no symptoms. RTC CPX 4 months

## 2018-06-04 NOTE — Patient Instructions (Addendum)
GO TO THE LAB : Get the blood work     GO TO THE FRONT DESK Schedule your next appointment   for a physical exam in 4 months approximately.  Not fasting

## 2018-06-05 NOTE — Assessment & Plan Note (Signed)
Anxiety: On Xanax as needed, takes mostly at night, check a UDS. Hyperlipidemia: Based on last FLP, Crestor dose increased.  Good compliance.  Check a lipid panel and liver test. GERD: Still has occasional symptoms, good compliance with meds, plans to discuss with GI. Elevated PSA: Negative biopsy 12-2016, has not seen urology again, DRE show enlarged prostate but nothing else.  Check a PSA.  Reports no symptoms. RTC CPX 4 months

## 2018-06-06 ENCOUNTER — Other Ambulatory Visit: Payer: Self-pay | Admitting: Internal Medicine

## 2018-06-07 LAB — PAIN MGMT, PROFILE 8 W/CONF, U
6 Acetylmorphine: NEGATIVE ng/mL (ref <10)
Alcohol Metabolites: NEGATIVE ng/mL (ref <500)
Alphahydroxyalprazolam: 42 ng/mL — ABNORMAL HIGH (ref <25)
Alphahydroxymidazolam: NEGATIVE ng/mL (ref <50)
Alphahydroxytriazolam: NEGATIVE ng/mL (ref <50)
Aminoclonazepam: NEGATIVE ng/mL (ref <25)
Amphetamines: NEGATIVE ng/mL (ref <500)
Benzodiazepines: POSITIVE ng/mL — AB (ref <100)
Buprenorphine, Urine: NEGATIVE ng/mL (ref <5)
Cocaine Metabolite: NEGATIVE ng/mL (ref <150)
Creatinine: 23.2 mg/dL
Hydroxyethylflurazepam: NEGATIVE ng/mL (ref <50)
Lorazepam: NEGATIVE ng/mL (ref <50)
MDMA: NEGATIVE ng/mL (ref <500)
Marijuana Metabolite: NEGATIVE ng/mL (ref <20)
Nordiazepam: NEGATIVE ng/mL (ref <50)
Opiates: NEGATIVE ng/mL (ref <100)
Oxazepam: NEGATIVE ng/mL (ref <50)
Oxidant: NEGATIVE ug/mL (ref <200)
Oxycodone: NEGATIVE ng/mL (ref <100)
Temazepam: NEGATIVE ng/mL (ref <50)
pH: 7.16 (ref 4.5–9.0)

## 2018-07-19 ENCOUNTER — Other Ambulatory Visit: Payer: Self-pay | Admitting: Gastroenterology

## 2018-07-23 ENCOUNTER — Other Ambulatory Visit: Payer: Self-pay | Admitting: Gastroenterology

## 2018-07-29 ENCOUNTER — Encounter: Payer: Self-pay | Admitting: General Surgery

## 2018-07-30 ENCOUNTER — Other Ambulatory Visit: Payer: Self-pay

## 2018-07-30 ENCOUNTER — Encounter: Payer: Self-pay | Admitting: Gastroenterology

## 2018-07-30 ENCOUNTER — Ambulatory Visit (INDEPENDENT_AMBULATORY_CARE_PROVIDER_SITE_OTHER): Payer: BLUE CROSS/BLUE SHIELD | Admitting: Gastroenterology

## 2018-07-30 VITALS — Ht 69.0 in | Wt 185.0 lb

## 2018-07-30 DIAGNOSIS — K219 Gastro-esophageal reflux disease without esophagitis: Secondary | ICD-10-CM

## 2018-07-30 DIAGNOSIS — Z86010 Personal history of colonic polyps: Secondary | ICD-10-CM

## 2018-07-30 DIAGNOSIS — K2270 Barrett's esophagus without dysplasia: Secondary | ICD-10-CM

## 2018-07-30 MED ORDER — FAMOTIDINE 40 MG PO TABS: 40.0000 mg | ORAL_TABLET | Freq: Every day | ORAL | 3 refills | Status: DC

## 2018-07-30 MED ORDER — DEXLANSOPRAZOLE 60 MG PO CPDR: DELAYED_RELEASE_CAPSULE | ORAL | 3 refills | Status: DC

## 2018-07-30 NOTE — Patient Instructions (Signed)
We have sent the following medications to your pharmacy for you to pick up at your convenience: Dexilant and famotidine.   We changed your recall EGD to 10/2018.

## 2018-07-30 NOTE — Progress Notes (Signed)
    History of Present Illness: This is a 63 year old male with a history of GERD and short segment Barrett's esophagus without dysplasia.  EGD in 2021 revealed Barrett's.  Subsequent endoscopies in 2014 and 2017 have not shown Barrett's on biopsy however esophageal changes consistent with Barrett's were noted visually.  He relates that his reflux is under very good control except for occasional nighttime breakthrough.  The head of his bed is elevated and he uses a wedge pillow.  Current Medications, Allergies, Past Medical History, Past Surgical History, Family History and Social History were reviewed in [employer] record.   Physical Exam: Telemedicine visit - not performed   Assessment and Recommendations:  1.  GERD with history of short segment Barrett's esophagus without dysplasia.  Surveillance EGD recommended in November 2020 and he would like to schedule this summer as opposed to this fall/winter which is reasonable.  Change EGD recall to July 2020.  Refill Dexilant 60 mg p.o. every morning and famotidine 40 mg every afternoon for 1 year. REV in 1 year.   2.  Personal history of adenomatous colon polyps.  A 5-year interval surveillance colonoscopy is recommended in November 2022.    These services were provided via telemedicine with video and audio.  The patient was at home and the provider was in the office, alone.  We discussed the limitations of evaluation and management by telemedicine and the availability of in person appointments.  Patient consented for this telemedicine visit and is aware of possible charges for this service.  The other person participating in the telemedicine service was Marlon Pel, [place] who reviewed medications, allergies, past history and completed AVS.  Time spent on call: 10 minutes

## 2018-07-31 ENCOUNTER — Telehealth: Payer: Self-pay | Admitting: Internal Medicine

## 2018-08-01 NOTE — Telephone Encounter (Signed)
Alprazolam.   Last OV: 06/04/2018 Last Fill: 05/23/2018 #60 and 1RF UDS: 06/04/2018 Low risk

## 2018-08-01 NOTE — Telephone Encounter (Signed)
sent 

## 2018-11-07 ENCOUNTER — Telehealth: Payer: Self-pay | Admitting: Internal Medicine

## 2018-11-07 ENCOUNTER — Encounter: Payer: Self-pay | Admitting: Gastroenterology

## 2018-11-07 NOTE — Telephone Encounter (Signed)
Alprazolam refill.   Last OV: 06/04/2018  Last Fill: 08/01/2018 #60 and 2RF Pt sig: 1 tab bid UDS: 06/04/2018 Low risk

## 2018-11-07 NOTE — Telephone Encounter (Signed)
sent 

## 2018-12-03 ENCOUNTER — Ambulatory Visit (AMBULATORY_SURGERY_CENTER): Payer: Self-pay | Admitting: *Deleted

## 2018-12-03 ENCOUNTER — Other Ambulatory Visit: Payer: Self-pay

## 2018-12-03 VITALS — Ht 68.5 in | Wt 191.0 lb

## 2018-12-03 DIAGNOSIS — K219 Gastro-esophageal reflux disease without esophagitis: Secondary | ICD-10-CM

## 2018-12-03 DIAGNOSIS — K2270 Barrett's esophagus without dysplasia: Secondary | ICD-10-CM

## 2018-12-03 NOTE — Progress Notes (Signed)
Patient is here in-person for PV. Patient denies any allergies to eggs or soy. Patient denies any problems with anesthesia/sedation. Patient denies any oxygen use at home. Patient denies taking any diet/weight loss medications or blood thinners. EMMI education assisgned to patient on EGD, this was explained and instructions given to patient. Pt is aware that care partner will wait in the car during procedure; if they feel like they will be too hot to wait in the car; they may wait in the lobby.  We want them to wear a mask (we do not have any that we can provide them), practice social distancing, and we will check their temperatures when they get here.  I did remind patient that their care partner needs to stay in the parking lot the entire time. Pt will wear mask into building.

## 2018-12-05 ENCOUNTER — Encounter: Payer: Self-pay | Admitting: Gastroenterology

## 2018-12-10 ENCOUNTER — Telehealth: Payer: Self-pay | Admitting: Internal Medicine

## 2018-12-10 NOTE — Telephone Encounter (Signed)
Alprazolam refill.   Last OV: 06/04/2018 Last Fill: 11/07/2018 #60 and 0RF Pt sig: 1 tab bid UDS: 06/04/2018 Low risk

## 2018-12-10 NOTE — Telephone Encounter (Signed)
sent 

## 2018-12-16 ENCOUNTER — Telehealth: Payer: Self-pay

## 2018-12-16 NOTE — Telephone Encounter (Signed)
Covid-19 screening questions   Do you now or have you had a fever in the last 14 days? NO   Do you have any respiratory symptoms of shortness of breath or cough now or in the last 14 days? NO  Do you have any family members or close contacts with diagnosed or suspected Covid-19 in the past 14 days? NO  Have you been tested for Covid-19 and found to be positive? NO        

## 2018-12-17 ENCOUNTER — Other Ambulatory Visit: Payer: Self-pay | Admitting: Gastroenterology

## 2018-12-17 ENCOUNTER — Other Ambulatory Visit: Payer: Self-pay

## 2018-12-17 ENCOUNTER — Ambulatory Visit (AMBULATORY_SURGERY_CENTER): Payer: BC Managed Care – PPO | Admitting: Gastroenterology

## 2018-12-17 ENCOUNTER — Encounter: Payer: Self-pay | Admitting: Gastroenterology

## 2018-12-17 VITALS — BP 141/78 | HR 61 | Temp 97.9°F | Resp 19 | Ht 68.5 in | Wt 191.0 lb

## 2018-12-17 DIAGNOSIS — K317 Polyp of stomach and duodenum: Secondary | ICD-10-CM | POA: Diagnosis not present

## 2018-12-17 DIAGNOSIS — K2270 Barrett's esophagus without dysplasia: Secondary | ICD-10-CM

## 2018-12-17 DIAGNOSIS — K219 Gastro-esophageal reflux disease without esophagitis: Secondary | ICD-10-CM

## 2018-12-17 MED ORDER — SODIUM CHLORIDE 0.9 % IV SOLN: 500.0000 mL | Freq: Once | INTRAVENOUS | Status: DC

## 2018-12-17 NOTE — Progress Notes (Signed)
Report to PACU, RN, vss, BBS= Clear.  

## 2018-12-17 NOTE — Patient Instructions (Signed)
Handout given for GERD.  YOU HAD AN ENDOSCOPIC PROCEDURE TODAY AT THE [facility]:   Refer to the procedure report that was given to you for any specific questions about what was found during the examination.  If the procedure report does not answer your questions, please call your gastroenterologist to clarify.  If you requested that your care partner not be given the details of your procedure findings, then the procedure report has been included in a sealed envelope for you to review at your convenience later.  YOU SHOULD EXPECT: Some feelings of bloating in the abdomen. Passage of more gas than usual.  Walking can help get rid of the air that was put into your GI tract during the procedure and reduce the bloating. If you had a lower endoscopy (such as a colonoscopy or flexible sigmoidoscopy) you may notice spotting of blood in your stool or on the toilet paper. If you underwent a bowel prep for your procedure, you may not have a normal bowel movement for a few days.  Please Note:  You might notice some irritation and congestion in your nose or some drainage.  This is from the oxygen used during your procedure.  There is no need for concern and it should clear up in a day or so.  SYMPTOMS TO REPORT IMMEDIATELY:   Following upper endoscopy (EGD)  Vomiting of blood or coffee ground material  New chest pain or pain under the shoulder blades  Painful or persistently difficult swallowing  New shortness of breath  Fever of 100F or higher  Black, tarry-looking stools  For urgent or emergent issues, a gastroenterologist can be reached at any hour by calling [phone].   DIET:  We do recommend a small meal at first, but then you may proceed to your regular diet.  Drink plenty of fluids but you should avoid alcoholic beverages for 24 hours.  ACTIVITY:  You should plan to take it easy for the rest of today and you should NOT DRIVE or use heavy machinery until tomorrow (because of  the sedation medicines used during the test).    FOLLOW UP: Our staff will call the number listed on your records 48-72 hours following your procedure to check on you and address any questions or concerns that you may have regarding the information given to you following your procedure. If we do not reach you, we will leave a message.  We will attempt to reach you two times.  During this call, we will ask if you have developed any symptoms of COVID 19. If you develop any symptoms (ie: fever, flu-like symptoms, shortness of breath, cough etc.) before then, please call [phone].  If you test positive for Covid 19 in the 2 weeks post procedure, please call and report this information to us.    If any biopsies were taken you will be contacted by phone or by letter within the next 1-3 weeks.  Please call us at [phone] if you have not heard about the biopsies in 3 weeks.    SIGNATURES/CONFIDENTIALITY: You and/or your care partner have signed paperwork which will be entered into your electronic medical record.  These signatures attest to the fact that that the information above on your After Visit Summary has been reviewed and is understood.  Full responsibility of the confidentiality of this discharge information lies with you and/or your care-partner. 

## 2018-12-17 NOTE — Progress Notes (Signed)
Called to room to assist during endoscopic procedure.  Patient ID and intended procedure confirmed with present staff. Received instructions for my participation in the procedure from the performing physician.  

## 2018-12-17 NOTE — Progress Notes (Signed)
Pt's states no medical or surgical changes since previsit or office visit.  KA - temps Brewer - vitals

## 2018-12-17 NOTE — Op Note (Signed)
[facility] Patient Name: Ryan Smith Procedure Date: 12/17/2018 9:58 AM MRN: [id number] Endoscopist: Ladene Artist , MD Age: 63 Referring MD:  Date of Birth: 1955-08-14 Gender: Male Account #: [id number] Procedure:                Upper GI endoscopy Indications:              Surveillance for malignancy due to personal history                            of Barrett's esophagus Medicines:                Monitored Anesthesia Care Procedure:                Pre-Anesthesia Assessment:                           - Prior to the procedure, a History and Physical                            was performed, and patient medications and                            allergies were reviewed. The patient's tolerance of                            previous anesthesia was also reviewed. The risks                            and benefits of the procedure and the sedation                            options and risks were discussed with the patient.                            All questions were answered, and informed consent                            was obtained. Prior Anticoagulants: The patient has                            taken no previous anticoagulant or antiplatelet                            agents. ASA Grade Assessment: II - A patient with                            mild systemic disease. After reviewing the risks                            and benefits, the patient was deemed in                            satisfactory condition to undergo the procedure.  After obtaining informed consent, the endoscope was                            passed under direct vision. Throughout the                            procedure, the patient's blood pressure, pulse, and                            oxygen saturations were monitored continuously. The                            Endoscope was introduced through the mouth, and                            advanced to the second part of  duodenum. The upper                            GI endoscopy was accomplished without difficulty.                            The patient tolerated the procedure well. Scope In: Scope Out: Findings:                 There were esophageal mucosal changes secondary to                            established short-segment Barrett's disease present                            in the distal esophagus. The maximum longitudinal                            extent of these mucosal changes was 2 cm in length.                            Mucosa was biopsied with a cold forceps for                            histology in a targeted manner at intervals of 1 cm                            in the lower third of the esophagus. One specimen                            bottle was sent to pathology.                           The exam of the esophagus was otherwise normal.                           Multiple 4 to 6 mm sessile polyps with no bleeding  and no stigmata of recent bleeding were found in                            the gastric body. Biopsies were taken with a cold                            forceps for histology.                           The exam of the stomach was otherwise normal.                           The duodenal bulb and second portion of the                            duodenum were normal. Complications:            No immediate complications. Estimated Blood Loss:     Estimated blood loss was minimal. Impression:               - Esophageal mucosal changes secondary to                            established short-segment Barrett's disease.                            Biopsied.                           - Multiple gastric polyps. Biopsied.                           - Normal duodenal bulb and second portion of the                            duodenum. Recommendation:           - Patient has a contact number available for                            emergencies. The signs and  symptoms of potential                            delayed complications were discussed with the                            patient. Return to normal activities tomorrow.                            Written discharge instructions were provided to the                            patient.                           - Resume previous diet.                           -  Antireflux measures.                           - Continue present medications.                           - Await pathology results.                           - Repeat upper endoscopy in 3 years for                            surveillance of Barrett's esophagus if no dysplasia. Ladene Artist, MD 12/17/2018 10:22:43 AM This report has been signed electronically.

## 2018-12-19 ENCOUNTER — Telehealth: Payer: Self-pay

## 2018-12-19 ENCOUNTER — Telehealth: Payer: Self-pay | Admitting: *Deleted

## 2018-12-19 NOTE — Telephone Encounter (Signed)
  Follow up Call-  Call back number 12/17/2018  Post procedure Call Back phone  # [phone]  Permission to leave phone message Yes  Some recent data might be hidden     Patient questions:  Do you have a fever, pain , or abdominal swelling? No. Pain Score  0 *  Have you tolerated food without any problems? Yes.    Have you been able to return to your normal activities? Yes.    Do you have any questions about your discharge instructions: Diet   No. Medications  No. Follow up visit  No.  Do you have questions or concerns about your Care? No.  Actions: * If pain score is 4 or above: No action needed, pain <4.  1. Have you developed a fever since your procedure? no  2.   Have you had an respiratory symptoms (SOB or cough) since your procedure? no  3.   Have you tested positive for COVID 19 since your procedure no  4.   Have you had any family members/close contacts diagnosed with the COVID 19 since your procedure?  no   If yes to any of these questions please route to Joylene Zadiel, RN and Alphonsa Gin, Therapist, sports.

## 2018-12-19 NOTE — Telephone Encounter (Signed)
Attempted to reach patient for post-procedure f/u call. No answer. Left message for that we will make another attempt to reach him later today and for him to please not hesitate to call us if he has any questions/concerns.

## 2018-12-25 ENCOUNTER — Encounter: Payer: Self-pay | Admitting: Gastroenterology

## 2019-01-02 ENCOUNTER — Other Ambulatory Visit: Payer: Self-pay | Admitting: Internal Medicine

## 2019-01-27 ENCOUNTER — Other Ambulatory Visit: Payer: Self-pay | Admitting: Internal Medicine

## 2019-02-20 ENCOUNTER — Telehealth: Payer: Self-pay | Admitting: Internal Medicine

## 2019-02-20 MED ORDER — ALPRAZOLAM 0.5 MG PO TABS: 0.5000 mg | ORAL_TABLET | Freq: Two times a day (BID) | ORAL | 0 refills | Status: DC

## 2019-02-20 MED ORDER — ROSUVASTATIN CALCIUM 20 MG PO TABS: 20.0000 mg | ORAL_TABLET | Freq: Every day | ORAL | 0 refills | Status: DC

## 2019-02-20 NOTE — Addendum Note (Signed)
Addended byDamita Dunnings D on: 02/20/2019 03:18 PM   Modules accepted: Orders

## 2019-02-20 NOTE — Telephone Encounter (Signed)
Overdue for a office visit  but I trust he will come 03/03/2019 for his appointment.  Prescription sent

## 2019-02-20 NOTE — Telephone Encounter (Signed)
Pt called to schedule OV for 03/03/2019. He will be out of both rosuvastatin and alprazolam 11/21. He is requesting 30 day refills to pharmacy please.   [id number] [address], [place] [phone] (Phone) [phone] (Fax)

## 2019-02-20 NOTE — Telephone Encounter (Signed)
Alprazolam refill.   Last OV: 06/04/2018, appt scheduled 03/03/2019 Last Fill: 12/10/2018 #60 and 1RF UDS: 06/04/2018 Low risk

## 2019-02-20 NOTE — Addendum Note (Signed)
Addended by: Kathlene November E on: 02/20/2019 04:44 PM   Modules accepted: Orders

## 2019-03-02 ENCOUNTER — Other Ambulatory Visit: Payer: Self-pay

## 2019-03-03 ENCOUNTER — Encounter: Payer: Self-pay | Admitting: Internal Medicine

## 2019-03-03 ENCOUNTER — Ambulatory Visit: Payer: BC Managed Care – PPO | Admitting: Internal Medicine

## 2019-03-03 ENCOUNTER — Other Ambulatory Visit: Payer: Self-pay

## 2019-03-03 VITALS — BP 165/97 | HR 77 | Temp 96.5°F | Resp 16 | Ht 69.0 in | Wt 192.1 lb

## 2019-03-03 DIAGNOSIS — R030 Elevated blood-pressure reading, without diagnosis of hypertension: Secondary | ICD-10-CM | POA: Diagnosis not present

## 2019-03-03 DIAGNOSIS — F411 Generalized anxiety disorder: Secondary | ICD-10-CM | POA: Diagnosis not present

## 2019-03-03 DIAGNOSIS — E785 Hyperlipidemia, unspecified: Secondary | ICD-10-CM | POA: Diagnosis not present

## 2019-03-03 LAB — COMPREHENSIVE METABOLIC PANEL
ALT: 90 U/L — ABNORMAL HIGH (ref 0–53)
AST: 46 U/L — ABNORMAL HIGH (ref 0–37)
Albumin: 4.5 g/dL (ref 3.5–5.2)
Alkaline Phosphatase: 93 U/L (ref 39–117)
BUN: 8 mg/dL (ref 6–23)
CO2: 31 mEq/L (ref 19–32)
Calcium: 9.7 mg/dL (ref 8.4–10.5)
Chloride: 103 mEq/L (ref 96–112)
Creatinine, Ser: 0.91 mg/dL (ref 0.40–1.50)
GFR: 83.84 mL/min (ref >60.00)
Glucose, Bld: 100 mg/dL — ABNORMAL HIGH (ref 70–99)
Potassium: 5.3 mEq/L — ABNORMAL HIGH (ref 3.5–5.1)
Sodium: 141 mEq/L (ref 135–145)
Total Bilirubin: 0.7 mg/dL (ref 0.2–1.2)
Total Protein: 6.8 g/dL (ref 6.0–8.3)

## 2019-03-03 LAB — CBC WITH DIFFERENTIAL/PLATELET
Basophils Absolute: 0.1 10*3/uL (ref 0.0–0.1)
Basophils Relative: 0.9 % (ref 0.0–3.0)
Eosinophils Absolute: 0.1 10*3/uL (ref 0.0–0.7)
Eosinophils Relative: 1.6 % (ref 0.0–5.0)
HCT: 49.2 % (ref 39.0–52.0)
Hemoglobin: 16.7 g/dL (ref 13.0–17.0)
Lymphocytes Relative: 34.2 % (ref 12.0–46.0)
Lymphs Abs: 2.2 10*3/uL (ref 0.7–4.0)
MCHC: 34 g/dL (ref 30.0–36.0)
MCV: 94.7 fl (ref 78.0–100.0)
Monocytes Absolute: 0.7 10*3/uL (ref 0.1–1.0)
Monocytes Relative: 11.5 % (ref 3.0–12.0)
Neutro Abs: 3.3 10*3/uL (ref 1.4–7.7)
Neutrophils Relative %: 51.8 % (ref 43.0–77.0)
Platelets: 358 10*3/uL (ref 150.0–400.0)
RBC: 5.2 Mil/uL (ref 4.22–5.81)
RDW: 13.7 % (ref 11.5–15.5)
WBC: 6.4 10*3/uL (ref 4.0–10.5)

## 2019-03-03 MED ORDER — ALPRAZOLAM 0.5 MG PO TABS: 0.5000 mg | ORAL_TABLET | Freq: Two times a day (BID) | ORAL | 1 refills | Status: DC

## 2019-03-03 MED ORDER — ROSUVASTATIN CALCIUM 20 MG PO TABS: 20.0000 mg | ORAL_TABLET | Freq: Every day | ORAL | 0 refills | Status: DC

## 2019-03-03 NOTE — Patient Instructions (Addendum)
GO TO THE LAB : Get the blood work     Ryan Smith Will ask patient to come back for a physical 04-2019      Check the  blood pressure 2 times a month   BP GOAL is between 110/65 and  135/85. If it is consistently higher or lower, let me know    DASH Eating Plan DASH stands for "Dietary Approaches to Stop Hypertension." The DASH eating plan is a healthy eating plan that has been shown to reduce high blood pressure (hypertension). It may also reduce your risk for type 2 diabetes, heart disease, and stroke. The DASH eating plan may also help with weight loss. What are tips for following this plan?  General guidelines  Avoid eating more than 2,300 mg (milligrams) of salt (sodium) a day. If you have hypertension, you may need to reduce your sodium intake to 1,500 mg a day.  Limit alcohol intake to no more than 1 drink a day for nonpregnant women and 2 drinks a day for men. One drink equals 12 oz of beer, 5 oz of wine, or 1 oz of hard liquor.  Work with your health care provider to maintain a healthy body weight or to lose weight. Ask what an ideal weight is for you.  Get at least 30 minutes of exercise that causes your heart to beat faster (aerobic exercise) most days of the week. Activities may include walking, swimming, or biking.  Work with your health care provider or diet and nutrition specialist (dietitian) to adjust your eating plan to your individual calorie needs. Reading food labels   Check food labels for the amount of sodium per serving. Choose foods with less than 5 percent of the Daily Value of sodium. Generally, foods with less than 300 mg of sodium per serving fit into this eating plan.  To find whole grains, look for the word "whole" as the first word in the ingredient list. Shopping  Buy products labeled as "low-sodium" or "no salt added."  Buy fresh foods. Avoid canned foods and premade or frozen meals. Cooking  Avoid adding salt when cooking. Use  salt-free seasonings or herbs instead of table salt or sea salt. Check with your health care provider or pharmacist before using salt substitutes.  Do not fry foods. Cook foods using healthy methods such as baking, boiling, grilling, and broiling instead.  Cook with heart-healthy oils, such as olive, canola, soybean, or sunflower oil. Meal planning  Eat a balanced diet that includes: ? 5 or more servings of fruits and vegetables each day. At each meal, try to fill half of your plate with fruits and vegetables. ? Up to 6-8 servings of whole grains each day. ? Less than 6 oz of lean meat, poultry, or fish each day. A 3-oz serving of meat is about the same size as a deck of cards. One egg equals 1 oz. ? 2 servings of low-fat dairy each day. ? A serving of nuts, seeds, or beans 5 times each week. ? Heart-healthy fats. Healthy fats called Omega-3 fatty acids are found in foods such as flaxseeds and [place] fish, like sardines, salmon, and mackerel.  Limit how much you eat of the following: ? Canned or prepackaged foods. ? Food that is high in trans fat, such as fried foods. ? Food that is high in saturated fat, such as fatty meat. ? Sweets, desserts, sugary drinks, and other foods with added sugar. ? Full-fat dairy products.  Do not  salt foods before eating.  Try to eat at least 2 vegetarian meals each week.  Eat more home-cooked food and less restaurant, buffet, and fast food.  When eating at a restaurant, ask that your food be prepared with less salt or no salt, if possible. What foods are recommended? The items listed may not be a complete list. Talk with your dietitian about what dietary choices are best for you. Grains Whole-grain or whole-wheat bread. Whole-grain or whole-wheat pasta. Brown rice. Ryan Smith. Bulgur. Whole-grain and low-sodium cereals. Pita bread. Low-fat, low-sodium crackers. Whole-wheat flour tortillas. Vegetables Fresh or frozen vegetables (raw, steamed,  roasted, or grilled). Low-sodium or reduced-sodium tomato and vegetable juice. Low-sodium or reduced-sodium tomato sauce and tomato paste. Low-sodium or reduced-sodium canned vegetables. Fruits All fresh, dried, or frozen fruit. Canned fruit in natural juice (without added sugar). Meat and other protein foods Skinless chicken or [place]. Ground chicken or [place]. Pork with fat trimmed off. Fish and seafood. Egg whites. Dried beans, peas, or lentils. Unsalted nuts, nut butters, and seeds. Unsalted canned beans. Lean cuts of beef with fat trimmed off. Low-sodium, lean deli meat. Dairy Low-fat (1%) or fat-free (skim) milk. Fat-free, low-fat, or reduced-fat cheeses. Nonfat, low-sodium ricotta or cottage cheese. Low-fat or nonfat yogurt. Low-fat, low-sodium cheese. Fats and oils Soft margarine without trans fats. Vegetable oil. Low-fat, reduced-fat, or light mayonnaise and salad dressings (reduced-sodium). Canola, safflower, olive, soybean, and sunflower oils. Avocado. Seasoning and other foods Herbs. Spices. Seasoning mixes without salt. Unsalted popcorn and pretzels. Fat-free sweets. What foods are not recommended? The items listed may not be a complete list. Talk with your dietitian about what dietary choices are best for you. Grains Baked goods made with fat, such as croissants, muffins, or some breads. Dry pasta or rice meal packs. Vegetables Creamed or fried vegetables. Vegetables in a cheese sauce. Regular canned vegetables (not low-sodium or reduced-sodium). Regular canned tomato sauce and paste (not low-sodium or reduced-sodium). Regular tomato and vegetable juice (not low-sodium or reduced-sodium). Ryan Smith. Olives. Fruits Canned fruit in a light or heavy syrup. Fried fruit. Fruit in cream or butter sauce. Meat and other protein foods Fatty cuts of meat. Ribs. Fried meat. Ryan Smith. Sausage. Bologna and other processed lunch meats. Salami. Fatback. Hotdogs. Bratwurst. Salted nuts and seeds. Canned  beans with added salt. Canned or smoked fish. Whole eggs or egg yolks. Chicken or [place] with skin. Dairy Whole or 2% milk, cream, and half-and-half. Whole or full-fat cream cheese. Whole-fat or sweetened yogurt. Full-fat cheese. Nondairy creamers. Whipped toppings. Processed cheese and cheese spreads. Fats and oils Butter. Stick margarine. Lard. Shortening. Ghee. Bacon fat. Tropical oils, such as coconut, palm kernel, or palm oil. Seasoning and other foods Salted popcorn and pretzels. Onion salt, garlic salt, seasoned salt, table salt, and sea salt. Worcestershire sauce. Tartar sauce. Barbecue sauce. Teriyaki sauce. Soy sauce, including reduced-sodium. Steak sauce. Canned and packaged gravies. Fish sauce. Oyster sauce. Cocktail sauce. Horseradish that you find on the shelf. Ketchup. Mustard. Meat flavorings and tenderizers. Bouillon cubes. Hot sauce and Tabasco sauce. Premade or packaged marinades. Premade or packaged taco seasonings. Relishes. Regular salad dressings. Where to find more information:  National Heart, Lung, and [place]: https://wilson-eaton.com/  American Heart Association: www.heart.org Summary  The DASH eating plan is a healthy eating plan that has been shown to reduce high blood pressure (hypertension). It may also reduce your risk for type 2 diabetes, heart disease, and stroke.  With the DASH eating plan, you should limit salt (sodium) intake to  2,300 mg a day. If you have hypertension, you may need to reduce your sodium intake to 1,500 mg a day.  When on the DASH eating plan, aim to eat more fresh fruits and vegetables, whole grains, lean proteins, low-fat dairy, and heart-healthy fats.  Work with your health care provider or diet and nutrition specialist (dietitian) to adjust your eating plan to your individual calorie needs. This information is not intended to replace advice given to you by your health care provider. Make sure you discuss any questions you have with your  health care provider. Document Released: 03/08/2011 Document Revised: 03/01/2017 Document Reviewed: 03/12/2016 Elsevier Patient Education  2020 [employer].

## 2019-03-03 NOTE — Progress Notes (Signed)
Pre visit review using our clinic review tool, if applicable. No additional management support is needed unless otherwise documented below in the visit note. 

## 2019-03-03 NOTE — Progress Notes (Signed)
Subjective:    Patient ID: Ryan Smith, male    DOB: 29-Apr-1955, 63 y.o.   MRN: [id number]  DOS:  03/03/2019 Type of visit - description: Routine office visit No major concerns, good compliance with medication. BP noted to be slightly elevated. He reports that he takes walks regularly his diet is usually good except for in the last couple of weeks due to Thanksgiving.    Review of Systems No fever chills No chest pain or palpitations.  No difficulty breathing  Past Medical History:  Diagnosis Date  . Abnormal LFTs    2008 neg. hep. serology, (-) ANA, ceruloplasmin, etc.;  liver biopsy showed  minimal active inflammation  . Anxiety   . Barrett's esophagus   . Elevated PSA   . Esophageal reflux   . Hearing loss    has hearing aids  . Hiatal hernia   . History of diverticulitis of colon   . Personal history of colonic polyps    tublar adenomas 2008 & hyerplastic 2011  . PVD (peripheral vascular disease) ([place])   . SCC (squamous cell carcinoma)    L shoulder    Past Surgical History:  Procedure Laterality Date  . ABDOMINAL AORTAGRAM N/A 11/25/2013   Procedure: ABDOMINAL Maxcine Ham;  Surgeon: Wellington Hampshire, MD;  Location: [facility];  Service: Cardiovascular;  Laterality: N/A;  . COLONOSCOPY  02/07/2016  . Laparoscopically-assisted Sigmoid Colectomy  11/10/2007  . LIVER BIOPSY     minimal active inflammation  . UPPER GASTROINTESTINAL ENDOSCOPY  02/07/2016    Social History   Socioeconomic History  . Marital status: Married    Spouse name: Not on file  . Number of children: 2  . Years of education: Not on file  . Highest education level: Not on file  Occupational History  . 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 Financial resource strain: Not on file  . Food insecurity    Worry: Not on file    Inability: Not on file  . Transportation needs    Medical: Not on file    Non-medical: Not on file   Tobacco Use  . Smoking status: Current Some Day Smoker    Years: 40.00    Types: Cigarettes  . Smokeless tobacco: Never Used  . Tobacco comment: uses 2-3 times per week  Substance and Sexual Activity  . Alcohol use: Yes    Alcohol/week: 20.0 standard drinks    Types: 20 Glasses of wine per week    Comment: 2-3 glasses wine glasses daily  . Drug use: No  . Sexual activity: Not on file  Lifestyle  . Physical activity    Days per week: Not on file    Minutes per session: Not on file  . Stress: Not on file  Relationships  . Social Herbalist on phone: Not on file    Gets together: Not on file    Attends religious service: Not on file    Active member of club or organization: Not on file    Attends meetings of clubs or organizations: Not on file    Relationship status: Not on file  . Intimate partner violence    Fear of current or ex partner: Not on file    Emotionally abused: Not on file    Physically abused: Not on file    Forced sexual activity: Not on file  Other Topics Concern  . Not on  file  Social History Narrative   Born in [place]   Married, 2 step sons (adults)      Allergies as of 03/03/2019   No Known Allergies     Medication List       Accurate as of March 03, 2019 11:20 AM. If you have any questions, ask your nurse or doctor.        ALPRAZolam 0.5 MG tablet Commonly known as: XANAX Take 1 tablet (0.5 mg total) by mouth 2 (two) times daily.   Coenzyme Q10 10 MG capsule Take 10 mg by mouth daily.   D-Limonene 1 g Caps Take by mouth.   dexlansoprazole 60 MG capsule Commonly known as: Dexilant TAKE 1 CAPSULE BY MOUTH EVERY DAY.   famotidine 40 MG tablet Commonly known as: PEPCID Take 1 tablet (40 mg total) by mouth at bedtime.   Magnesium 500 MG Caps Take 1 capsule by mouth at bedtime.   psyllium 58.6 % powder Commonly known as: METAMUCIL Take 1 packet by mouth at bedtime.   rosuvastatin 20 MG tablet Commonly known  as: CRESTOR Take 1 tablet (20 mg total) by mouth daily.           Objective:   Physical Exam BP (!) 165/97 (BP Location: Left Arm, Patient Position: Sitting, Cuff Size: Normal)   Pulse 77   Temp (!) 96.5 F (35.8 C) (Temporal)   Resp 16   Ht 5\' 9"  (1.753 m)   Wt 192 lb 2 oz (87.1 kg)   SpO2 98%   BMI 28.37 kg/m  General:   Well developed, NAD, BMI noted. HEENT:  Normocephalic . Face symmetric, atraumatic Lungs:  CTA B Normal respiratory effort, no intercostal retractions, no accessory muscle use. Heart: RRR,  no murmur.  No pretibial edema bilaterally  Skin: Not pale. Not jaundice Neurologic:  alert & oriented X3.  Speech normal, gait appropriate for age and unassisted Psych--  Cognition and judgment appear intact.  Cooperative with normal attention span and concentration.  Behavior appropriate. No anxious or depressed appearing.      Assessment      Assessment Anxiety Hyperlipidemia  PVD R leg, dx 2015,   claudication 10-2013, ABIs showed obstruction, saw [name], had a aortogram , was referred to surgery. They rx a CT chest show mild atherosclerosis disease of the aorta, coronary arthrosclerosis. Had a MRI of the right leg, see report. Was recommended a bypass but sx  improved, did not proceed. No benefit from cilostazol GI:  --Barrett's, GERD, last EGD,BX : 02-2016  --Abnormal LFTs: 2008 neg. hep. serology, (-) ANA, ceruloplasmin, etc.;  liver bx:  minimal active inflammation Hearing loss, has aids  Sees dermatology q 6 ,months as off 12-2015 Stress 12-2013: Low risk Prostate BX 12-2016 (-)  PLAN: Elevated BP: BP was slightly elevated, I recheck manually: Right arm 155/95, left arm 160/95. Recommend low-salt diet, remain active, risks of elevated BP discussed.  He will be quite reluctant to take medication though. We will reassess BP when he comes back for a physical. Hyperlipidemia: Controlled, checking LFTs Anxiety: Refill Xanax Barrett's: Last EGD  12/17/2018, biopsy benign next per GI Preventive care: Had a flu shot already. RTC CPX 04-2019   This visit occurred during the SARS-CoV-2 public health emergency.  Safety protocols were in place, including screening questions prior to the visit, additional usage of staff PPE, and extensive cleaning of exam room while observing appropriate contact time as indicated for disinfecting solutions.

## 2019-03-04 NOTE — Assessment & Plan Note (Signed)
Elevated BP: BP was slightly elevated, I recheck manually: Right arm 155/95, left arm 160/95. Recommend low-salt diet, remain active, risks of elevated BP discussed.  He will be quite reluctant to take medication though. We will reassess BP when he comes back for a physical. Hyperlipidemia: Controlled, checking LFTs Anxiety: Refill Xanax Barrett's: Last EGD 12/17/2018, biopsy benign next per GI Preventive care: Had a flu shot already. RTC CPX 04-2019

## 2019-03-12 NOTE — Telephone Encounter (Signed)
-----   Message from [place], [place] sent at 03/12/2019  2:30 PM EST ----- Regarding: FW: Please call patient, change appointment to a physical exam on 04-2019. Hey- just a reminder that his nurse visit for 04/2019 needs to be changed to cpe please.  ----- Message ----- From: Colon Branch, MD Sent: 03/04/2019   4:54 PM EST To: Colon Branch, MD, Damita Dunnings, CMA Subject: Please call patient, change appointment to a#

## 2019-03-12 NOTE — Telephone Encounter (Signed)
LMOVM for pt to call office to change nurse visit for BP check to a CPE the week of April 20, 2019.

## 2019-04-06 ENCOUNTER — Other Ambulatory Visit: Payer: Self-pay

## 2019-04-06 ENCOUNTER — Telehealth: Payer: Self-pay | Admitting: Medical

## 2019-04-06 ENCOUNTER — Ambulatory Visit: Payer: BC Managed Care – PPO | Admitting: Medical

## 2019-04-06 ENCOUNTER — Encounter: Payer: Self-pay | Admitting: Medical

## 2019-04-06 VITALS — BP 172/97 | HR 71 | Temp 98.4°F | Resp 18 | Ht 69.0 in | Wt 191.4 lb

## 2019-04-06 DIAGNOSIS — R030 Elevated blood-pressure reading, without diagnosis of hypertension: Secondary | ICD-10-CM | POA: Diagnosis not present

## 2019-04-06 DIAGNOSIS — H6123 Impacted cerumen, bilateral: Secondary | ICD-10-CM | POA: Diagnosis not present

## 2019-04-06 NOTE — Patient Instructions (Signed)
Your wax was cleared completely. If get blocked again in future use debrox again for 2-3 days then come in for lavage as you did this time.  Saw bp elevated after hours. I saw on last note Dr. Larose Kells noted 2 bp readings elevated. Will ask MA Tiffany to call pt and see if pt has bp cuff to check bp 2-3 times over next week. Then notify us in one on bp levels. I will update Dr. Larose Kells on those readings and see if he might want to start bp medication. Today bp was quite high and need to proceed with caution.  Follow up one week or as needed

## 2019-04-06 NOTE — Telephone Encounter (Signed)
Opened to review 

## 2019-04-06 NOTE — Telephone Encounter (Addendum)
Saw bp elevated after hours. I saw on last note Dr. Larose Kells noted 2 bp readings elevated. Will you   call pt and see if pt has bp cuff to check bp 2-3 times over next week. Then notify us in one on bp levels. I will update Dr. Larose Kells on those readings and see if he might want to start bp medication. Today bp was quite high and need to proceed with caution.  Follow up one week

## 2019-04-06 NOTE — Progress Notes (Signed)
Subjective:    Patient ID: Ryan Smith, male    DOB: 04-03-1955, 64 y.o.   MRN: [id number]  HPI  Pt in with sensation of rt ear feeling blocked. Some on left side as well.   He wants lavage if ears blocked..  No nasal congestion.  He does wear hearing aids.   No ear pain.  Symptoms for past week or so.     Review of Systems  Constitutional: Negative for chills and fatigue.  HENT:       Ear blocked sensation.  Respiratory: Negative for cough, chest tightness, shortness of breath and wheezing.   Cardiovascular: Negative for chest pain and palpitations.  Gastrointestinal: Negative for abdominal distention, abdominal pain, constipation, diarrhea, nausea and vomiting.  Musculoskeletal: Negative for back pain.  Neurological: Negative for dizziness, syncope, weakness and numbness.       None reported.  Hematological: Negative for adenopathy. Does not bruise/bleed easily.   Past Medical History:  Diagnosis Date  . Abnormal LFTs    2008 neg. hep. serology, (-) ANA, ceruloplasmin, etc.;  liver biopsy showed  minimal active inflammation  . Anxiety   . Barrett's esophagus   . Elevated PSA   . Esophageal reflux   . Hearing loss    has hearing aids  . Hiatal hernia   . History of diverticulitis of colon   . Personal history of colonic polyps    tublar adenomas 2008 & hyerplastic 2011  . PVD (peripheral vascular disease) ([place])   . SCC (squamous cell carcinoma)    L shoulder     Social History   Socioeconomic History  . Marital status: Married    Spouse name: Not on file  . Number of children: 2  . Years of education: Not on file  . Highest education level: Not on file  Occupational History  . 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 Smoking status: Current Some Day Smoker    Years: 40.00    Types: Cigarettes  . Smokeless tobacco: Never Used  . Tobacco comment: uses 2-3 times per week  Substance and  Sexual Activity  . Alcohol use: Yes    Alcohol/week: 20.0 standard drinks    Types: 20 Glasses of wine per week    Comment: 2-3 glasses wine glasses daily  . Drug use: No  . Sexual activity: Not on file  Other Topics Concern  . Not on file  Social History Narrative   Born in [place]   Married, 2 step sons (adults)   Social Determinants of Health   Financial Resource Strain:   . Difficulty of Paying Living Expenses: Not on file  Food Insecurity:   . Worried About Charity fundraiser in the Last Year: Not on file  . Ran Out of Food in the Last Year: Not on file  Transportation Needs:   . Lack of Transportation (Medical): Not on file  . Lack of Transportation (Non-Medical): Not on file  Physical Activity:   . Days of Exercise per Week: Not on file  . Minutes of Exercise per Session: Not on file  Stress:   . Feeling of Stress : Not on file  Social Connections:   . Frequency of Communication with Friends and Family: Not on file  . Frequency of Social Gatherings with Friends and Family: Not on file  . Attends Religious Services: Not on file  . Active Member of Clubs or Organizations: Not  on file  . Attends Archivist Meetings: Not on file  . Marital Status: Not on file  Intimate Partner Violence:   . Fear of Current or Ex-Partner: Not on file  . Emotionally Abused: Not on file  . Physically Abused: Not on file  . Sexually Abused: Not on file    Past Surgical History:  Procedure Laterality Date  . ABDOMINAL AORTAGRAM N/A 11/25/2013   Procedure: ABDOMINAL Maxcine Ham;  Surgeon: Wellington Hampshire, MD;  Location: [facility];  Service: Cardiovascular;  Laterality: N/A;  . COLONOSCOPY  02/07/2016  . Laparoscopically-assisted Sigmoid Colectomy  11/10/2007  . LIVER BIOPSY     minimal active inflammation  . UPPER GASTROINTESTINAL ENDOSCOPY  02/07/2016    Family History  Problem Relation Age of Onset  . Prostate cancer Father 64  . Esophageal cancer Father   .  Coronary artery disease Father        dx age 23, CABG  . Heart disease Father   . Heart disease Paternal Uncle        x 2  . Diverticulitis Mother   . Colon cancer Neg Hx   . Diabetes Neg Hx   . Rectal cancer Neg Hx   . Stomach cancer Neg Hx   . Colon polyps Neg Hx     No Known Allergies  Current Outpatient Medications on File Prior to Visit  Medication Sig Dispense Refill  . ALPRAZolam (XANAX) 0.5 MG tablet Take 1 tablet (0.5 mg total) by mouth 2 (two) times daily. 60 tablet 1  . Coenzyme Q10 10 MG capsule Take 10 mg by mouth daily.    Marland Kitchen dexlansoprazole (DEXILANT) 60 MG capsule TAKE 1 CAPSULE BY MOUTH EVERY DAY. 90 capsule 3  . famotidine (PEPCID) 40 MG tablet Take 1 tablet (40 mg total) by mouth at bedtime. 90 tablet 3  . Magnesium 500 MG CAPS Take 1 capsule by mouth at bedtime.      Jenne Pane (D-LIMONENE) 1 g CAPS Take by mouth.    . psyllium (METAMUCIL) 58.6 % powder Take 1 packet by mouth at bedtime.     . rosuvastatin (CRESTOR) 20 MG tablet Take 1 tablet (20 mg total) by mouth daily. 90 tablet 0   No current facility-administered medications on file prior to visit.    BP (!) 172/97 (BP Location: Right Arm, Patient Position: Sitting, Cuff Size: Large)   Pulse 71   Temp 98.4 F (36.9 C) (Oral)   Resp 18   Ht 5\' 9"  (1.753 m)   Wt 191 lb 6.4 oz (86.8 kg)   SpO2 96%   BMI 28.26 kg/m       Objective:   Physical Exam  General- No acute distress. Pleasant patient. Neck- Full range of motion, no jvd Lungs- Clear, even and unlabored. Heart- regular rate and rhythm. Neurologic- CNII- XII grossly intact. heent- negative excepet prelavage severe wax obstruction. Post lavage wax cleared completely. TM are normal. No redness.       Assessment & Plan:  Your wax was cleared completely. If get blocked again in future use debrox again for 2-3 days then come in for lavage as you did this time.  Saw bp elevated after hours. I saw on last note Dr. Larose Kells noted 2 bp readings  elevated. Will ask MA Tiffany to call pt and see if pt has bp cuff to check bp 2-3 times over next week. Then notify us in one on bp levels. I will update Dr. Larose Kells  on those readings and see if he might want to start bp medication. Today bp was quite high and need to proceed with caution.  Follow up one week or as needed  [employer], PA-C   20 minutes spent with pt today/

## 2019-04-07 NOTE — Telephone Encounter (Signed)
Spoke to patient and he wanted to schedule a annual with Dr. Larose Kells. Annual scheduled

## 2019-04-07 NOTE — Telephone Encounter (Signed)
LVM for patient to call back. ?

## 2019-04-10 ENCOUNTER — Encounter: Payer: Self-pay | Admitting: Internal Medicine

## 2019-04-10 ENCOUNTER — Other Ambulatory Visit: Payer: Self-pay

## 2019-04-10 ENCOUNTER — Ambulatory Visit (INDEPENDENT_AMBULATORY_CARE_PROVIDER_SITE_OTHER): Payer: BC Managed Care – PPO | Admitting: Internal Medicine

## 2019-04-10 VITALS — BP 147/78 | HR 75 | Temp 96.3°F | Resp 12 | Ht 69.0 in | Wt 193.0 lb

## 2019-04-10 DIAGNOSIS — Z0000 Encounter for general adult medical examination without abnormal findings: Secondary | ICD-10-CM | POA: Diagnosis not present

## 2019-04-10 DIAGNOSIS — E785 Hyperlipidemia, unspecified: Secondary | ICD-10-CM | POA: Diagnosis not present

## 2019-04-10 DIAGNOSIS — R972 Elevated prostate specific antigen [PSA]: Secondary | ICD-10-CM

## 2019-04-10 DIAGNOSIS — R739 Hyperglycemia, unspecified: Secondary | ICD-10-CM

## 2019-04-10 LAB — LIPID PANEL
Cholesterol: 183 mg/dL (ref 0–200)
HDL: 44.7 mg/dL (ref >39.00)
LDL Cholesterol: 107 mg/dL — ABNORMAL HIGH (ref 0–99)
NonHDL: 137.94
Total CHOL/HDL Ratio: 4
Triglycerides: 156 mg/dL — ABNORMAL HIGH (ref 0.0–149.0)
VLDL: 31.2 mg/dL (ref 0.0–40.0)

## 2019-04-10 LAB — BASIC METABOLIC PANEL
BUN: 12 mg/dL (ref 6–23)
CO2: 31 mEq/L (ref 19–32)
Calcium: 9.7 mg/dL (ref 8.4–10.5)
Chloride: 103 mEq/L (ref 96–112)
Creatinine, Ser: 1 mg/dL (ref 0.40–1.50)
GFR: 75.17 mL/min (ref >60.00)
Glucose, Bld: 110 mg/dL — ABNORMAL HIGH (ref 70–99)
Potassium: 5.2 mEq/L — ABNORMAL HIGH (ref 3.5–5.1)
Sodium: 140 mEq/L (ref 135–145)

## 2019-04-10 LAB — HEMOGLOBIN A1C: Hgb A1c MFr Bld: 5.5 % (ref 4.6–6.5)

## 2019-04-10 LAB — PSA: PSA: 4.24 ng/mL — ABNORMAL HIGH (ref 0.10–4.00)

## 2019-04-10 LAB — AST: AST: 47 U/L — ABNORMAL HIGH (ref 0–37)

## 2019-04-10 LAB — ALT: ALT: 73 U/L — ABNORMAL HIGH (ref 0–53)

## 2019-04-10 NOTE — Progress Notes (Signed)
Subjective:    Patient ID: Ryan Smith, male    DOB: 12/01/1955, 64 y.o.   MRN: [id number]  DOS:  04/10/2019 Type of visit - description: Here for CPX In general feeling well.    Review of Systems Has a history of right leg peripheral vascular disease, able to walk 2 miles before he developed mild calf pain.  Other than above, a 14 point review of systems is negative    Past Medical History:  Diagnosis Date  . Abnormal LFTs    2008 neg. hep. serology, (-) ANA, ceruloplasmin, etc.;  liver biopsy showed  minimal active inflammation  . Anxiety   . Barrett's esophagus   . Elevated PSA   . Esophageal reflux   . Hearing loss    has hearing aids  . Hiatal hernia   . History of diverticulitis of colon   . Personal history of colonic polyps    tublar adenomas 2008 & hyerplastic 2011  . PVD (peripheral vascular disease) ([place])   . SCC (squamous cell carcinoma)    L shoulder    Past Surgical History:  Procedure Laterality Date  . ABDOMINAL AORTAGRAM N/A 11/25/2013   Procedure: ABDOMINAL Maxcine Ham;  Surgeon: Wellington Hampshire, MD;  Location: [facility];  Service: Cardiovascular;  Laterality: N/A;  . COLONOSCOPY  02/07/2016  . Laparoscopically-assisted Sigmoid Colectomy  11/10/2007  . LIVER BIOPSY     minimal active inflammation  . UPPER GASTROINTESTINAL ENDOSCOPY  02/07/2016    Social History   Socioeconomic History  . Marital status: Married    Spouse name: Not on file  . Number of children: 2  . Years of education: Not on file  . Highest education level: Not on file  Occupational History  . 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 Smoking status: Former Smoker    Years: 40.00    Types: Cigarettes    Quit date: 02/26/2019    Years since quitting: 0.1  . Smokeless tobacco: Never Used  . Tobacco comment:    Substance and Sexual Activity  . Alcohol use: Yes    Alcohol/week: 20.0 standard drinks    Types: 20  Glasses of wine per week    Comment: 1~ 2 glasses wine glasses daily  . Drug use: No  . Sexual activity: Not on file  Other Topics Concern  . Not on file  Social History Narrative   Born in [place]   Married, 2 step sons (adults)   Social Determinants of Health   Financial Resource Strain:   . Difficulty of Paying Living Expenses: Not on file  Food Insecurity:   . Worried About Charity fundraiser in the Last Year: Not on file  . Ran Out of Food in the Last Year: Not on file  Transportation Needs:   . Lack of Transportation (Medical): Not on file  . Lack of Transportation (Non-Medical): Not on file  Physical Activity:   . Days of Exercise per Week: Not on file  . Minutes of Exercise per Session: Not on file  Stress:   . Feeling of Stress : Not on file  Social Connections:   . Frequency of Communication with Friends and Family: Not on file  . Frequency of Social Gatherings with Friends and Family: Not on file  . Attends Religious Services: Not on file  . Active Member of Clubs or Organizations: Not on file  . Attends Archivist  Meetings: Not on file  . Marital Status: Not on file  Intimate Partner Violence:   . Fear of Current or Ex-Partner: Not on file  . Emotionally Abused: Not on file  . Physically Abused: Not on file  . Sexually Abused: Not on file     Family History  Problem Relation Age of Onset  . Prostate cancer Father 68  . Esophageal cancer Father   . Coronary artery disease Father        dx age 47, CABG  . Heart disease Father   . Heart disease Paternal Uncle        x 2  . Diverticulitis Mother   . Colon cancer Neg Hx   . Diabetes Neg Hx   . Rectal cancer Neg Hx   . Stomach cancer Neg Hx   . Colon polyps Neg Hx      Allergies as of 04/10/2019   No Known Allergies     Medication List       Accurate as of April 10, 2019 11:59 PM. If you have any questions, ask your nurse or doctor.        ALPRAZolam 0.5 MG tablet Commonly  known as: XANAX Take 1 tablet (0.5 mg total) by mouth 2 (two) times daily.   Coenzyme Q10 10 MG capsule Take 10 mg by mouth daily.   D-Limonene 1 g Caps Take by mouth.   dexlansoprazole 60 MG capsule Commonly known as: Dexilant TAKE 1 CAPSULE BY MOUTH EVERY DAY.   famotidine 40 MG tablet Commonly known as: PEPCID Take 1 tablet (40 mg total) by mouth at bedtime.   Magnesium 500 MG Caps Take 1 capsule by mouth at bedtime.   psyllium 58.6 % powder Commonly known as: METAMUCIL Take 1 packet by mouth at bedtime.   rosuvastatin 20 MG tablet Commonly known as: CRESTOR Take 1 tablet (20 mg total) by mouth daily.           Objective:   Physical Exam BP (!) 147/78 (BP Location: Right Arm, Cuff Size: Large)   Pulse 75   Temp (!) 96.3 F (35.7 C) (Temporal)   Resp 12   Ht 5\' 9"  (1.753 m)   Wt 193 lb (87.5 kg)   SpO2 100%   BMI 28.50 kg/m  General: Well developed, NAD, BMI noted Neck: No  thyromegaly  HEENT:  Normocephalic . Face symmetric, atraumatic Lungs:  CTA B Normal respiratory effort, no intercostal retractions, no accessory muscle use. Heart: RRR,  no murmur.  Abdomen:  Not distended, soft, non-tender. No rebound or rigidity.  No bruit at Lower extremities: No edema, very good femoral pulses Skin: Exposed areas without rash. Not pale. Not jaundice DRE: Brown stools, normal sphincter tone, prostate slightly enlarged but not tender or nodular Neurologic:  alert & oriented X3.  Speech normal, gait appropriate for age and unassisted Strength symmetric and appropriate for age.  Psych: Cognition and judgment appear intact.  Cooperative with normal attention span and concentration.  Behavior appropriate. No anxious or depressed appearing.     Assessment      Assessment Anxiety Hyperlipidemia  PVD R leg, dx 2015,   claudication 10-2013, ABIs showed obstruction, saw [name], had a aortogram , was referred to surgery. They rx a CT chest show mild  atherosclerosis disease of the aorta, coronary arthrosclerosis. Had a MRI of the right leg, see report. Was recommended a bypass but sx  improved, did not proceed. No benefit from cilostazol GI:  --Barrett's, GERD,  last EGD,BX : 02-2016  --Abnormal LFTs: 2008 neg. hep. serology, (-) ANA, ceruloplasmin, etc.;  liver bx:  minimal active inflammation Hearing loss, has aids  Sees dermatology q 6 ,months as off 12-2015 Stress 12-2013: Low risk Prostate BX 12-2016 (-)  PLAN: Here for CPX Elevated BP: Is doing better with diet, increasing his exercise level, BP today 147/78.  Close to goal.  Recommend to continue monitoring, watch salt intake Anxiety: Not an issue at this point Hyperlipidemia: On Crestor, last LDL 88, goal less than 70.  Patient aware, my need to increase Crestor dose, states he won't increase his Crestor dose. PVD:  Does not see vascular regularly, have mild claudication after walking 2 miles, plan is to control CV RFs Abnormal LFTs: Monitor them today Mild hyperglycemia: Check A1c RTC 6 months (states he will not return until he has Medicare)     This visit occurred during the SARS-CoV-2 public health emergency.  Safety protocols were in place, including screening questions prior to the visit, additional usage of staff PPE, and extensive cleaning of exam room while observing appropriate contact time as indicated for disinfecting solutions.

## 2019-04-10 NOTE — Patient Instructions (Signed)
GO TO THE LAB : Get the blood work     GO TO THE FRONT DESK Schedule your next appointment for a checkup in 6 months     Check your blood pressure to 3 times a month   BP GOAL is between 110/65 and  135/85. If it is consistently higher or lower, let me know

## 2019-04-12 NOTE — Assessment & Plan Note (Signed)
Here for CPX Elevated BP: Is doing better with diet, increasing his exercise level, BP today 147/78.  Close to goal.  Recommend to continue monitoring, watch salt intake Anxiety: Not an issue at this point Hyperlipidemia: On Crestor, last LDL 88, goal less than 70.  Patient aware, my need to increase Crestor dose, states he won't increase his Crestor dose. PVD:  Does not see vascular regularly, have mild claudication after walking 2 miles, plan is to control CV RFs Abnormal LFTs: Monitor them today Mild hyperglycemia: Check A1c RTC 6 months (states he will not return until he has Medicare)

## 2019-04-12 NOTE — Assessment & Plan Note (Signed)
--  Td  05-2016  - pnm 23  2016 -  shingrix: we agreed on wait for medicare  - had a flu shot  --CCS: last colonoscopy 10/2009, polyp,   cscope 02-2016: + Polyps, 5 years. --Prostate cancer screening: 12-2016 prostate bx (-) , has not seen urology lately, waiting for Medicare.  DRE today show enlarged gland, check a PSA --Exercise-diet: Discussed --Tobacco: Quit 02-2019, praised  --Lung cancer screening: Smoke 0.25 PPD for 40 years, officially does not qualify under Medicare guidelines.  Will discuss on RTC.  Currently cost is an issue for him. --Labs: BMP, AST, ALT, FLP, A1c, PSA.

## 2019-04-21 ENCOUNTER — Ambulatory Visit: Payer: BC Managed Care – PPO

## 2019-04-27 DIAGNOSIS — C4492 Squamous cell carcinoma of skin, unspecified: Secondary | ICD-10-CM | POA: Insufficient documentation

## 2019-05-10 ENCOUNTER — Ambulatory Visit: Payer: BC Managed Care – PPO | Attending: Internal Medicine

## 2019-05-10 DIAGNOSIS — Z23 Encounter for immunization: Secondary | ICD-10-CM

## 2019-05-10 NOTE — Progress Notes (Signed)
   Covid-19 Vaccination Clinic  Name:  Ryan Smith    MRN: [id number] DOB: [birth year]  05/10/2019  [name] was observed post Covid-19 immunization for 15 minutes without incidence. He was provided with Vaccine Information Sheet and instruction to access the V-Safe system.   [name] was instructed to call 911 with any severe reactions post vaccine: Marland Kitchen Difficulty breathing  . Swelling of your face and throat  . A fast heartbeat  . A bad rash all over your body  . Dizziness and weakness    Immunizations Administered    Name Date Dose VIS Date Route   Pfizer COVID-19 Vaccine 05/10/2019  3:44 PM 0.3 mL 03/13/2019 Intramuscular   Manufacturer: [place]   Lot: [id number]   [place]: [id number]

## 2019-05-26 ENCOUNTER — Ambulatory Visit: Payer: BC Managed Care – PPO

## 2019-06-04 ENCOUNTER — Ambulatory Visit: Payer: BC Managed Care – PPO | Attending: Internal Medicine

## 2019-06-04 DIAGNOSIS — Z23 Encounter for immunization: Secondary | ICD-10-CM | POA: Insufficient documentation

## 2019-06-04 NOTE — Progress Notes (Signed)
   Covid-19 Vaccination Clinic  Name:  Ryan Smith    MRN: [id number] DOB: 29-Mar-1956  06/04/2019  [name] was observed post Covid-19 immunization for 15 minutes without incident. He was provided with Vaccine Information Sheet and instruction to access the V-Safe system.   [name] was instructed to call 911 with any severe reactions post vaccine: Marland Kitchen Difficulty breathing  . Swelling of face and throat  . A fast heartbeat  . A bad rash all over body  . Dizziness and weakness   Immunizations Administered    Name Date Dose VIS Date Route   Pfizer COVID-19 Vaccine 06/04/2019 12:14 PM 0.3 mL 03/13/2019 Intramuscular   Manufacturer: [place]   Lot: [id number]   [place]: [id number]

## 2019-06-12 ENCOUNTER — Other Ambulatory Visit: Payer: Self-pay | Admitting: Internal Medicine

## 2019-06-12 ENCOUNTER — Other Ambulatory Visit: Payer: Self-pay | Admitting: Gastroenterology

## 2019-06-12 NOTE — Telephone Encounter (Signed)
PDMP okay, prescription sent 

## 2019-06-12 NOTE — Telephone Encounter (Signed)
Requesting:Alprazolam Contract:04/19/2017 UDS:06/04/2018 Last Visit:04/10/2019 Next Visit:none Last Refill:03/03/2019  Please Advise

## 2019-07-22 ENCOUNTER — Ambulatory Visit: Payer: BC Managed Care – PPO | Admitting: Internal Medicine

## 2019-07-22 ENCOUNTER — Encounter: Payer: Self-pay | Admitting: Internal Medicine

## 2019-07-22 ENCOUNTER — Other Ambulatory Visit: Payer: Self-pay

## 2019-07-22 VITALS — BP 169/92 | HR 75 | Temp 97.6°F | Resp 16 | Ht 69.0 in | Wt 191.1 lb

## 2019-07-22 DIAGNOSIS — G4700 Insomnia, unspecified: Secondary | ICD-10-CM

## 2019-07-22 DIAGNOSIS — Z79899 Other long term (current) drug therapy: Secondary | ICD-10-CM

## 2019-07-22 DIAGNOSIS — F411 Generalized anxiety disorder: Secondary | ICD-10-CM | POA: Diagnosis not present

## 2019-07-22 DIAGNOSIS — I10 Essential (primary) hypertension: Secondary | ICD-10-CM | POA: Diagnosis not present

## 2019-07-22 HISTORY — DX: Essential (primary) hypertension: I10

## 2019-07-22 MED ORDER — HYDROCHLOROTHIAZIDE 25 MG PO TABS: 25.0000 mg | ORAL_TABLET | Freq: Every day | ORAL | 0 refills | Status: DC

## 2019-07-22 NOTE — Patient Instructions (Signed)
Start hydrochlorothiazide 1 tablet in the morning. It is a diuretic  Check your blood pressures 2-3 times a week BP GOAL is between 110/65 and  135/85. If it is consistently higher or lower, let me know    GO TO THE FRONT DESK, [place] back for   blood work in 2 weeks

## 2019-07-22 NOTE — Progress Notes (Signed)
Subjective:    Patient ID: Ryan Smith, male    DOB: [birth year], 64 y.o.   MRN: [id number]  DOS:  07/22/2019 Type of visit - description: Acute, BP management Since the last visit, has checked his blood pressure 2-3 times, it has been consistently elevated and is elevated today when checked by my nurse. Denies taking NSAIDs, does not eat excessive salt, room for improvement on diet.  BP Readings from Last 3 Encounters:  07/22/19 (!) 169/92  04/10/19 (!) 147/78  04/06/19 (!) 172/97   Wt Readings from Last 3 Encounters:  07/22/19 191 lb 2 oz (86.7 kg)  04/10/19 193 lb (87.5 kg)  04/06/19 191 lb 6.4 oz (86.8 kg)     Review of Systems Denies chest pain or difficulty breathing No edema No headache or dizziness  Past Medical History:  Diagnosis Date  . Abnormal LFTs    2008 neg. hep. serology, (-) ANA, ceruloplasmin, etc.;  liver biopsy showed  minimal active inflammation  . Anxiety   . Barrett's esophagus   . Elevated PSA   . Esophageal reflux   . Hearing loss    has hearing aids  . Hiatal hernia   . History of diverticulitis of colon   . Personal history of colonic polyps    tublar adenomas 2008 & hyerplastic 2011  . PVD (peripheral vascular disease) ([place])   . SCC (squamous cell carcinoma)    L shoulder    Past Surgical History:  Procedure Laterality Date  . ABDOMINAL AORTAGRAM N/A 11/25/2013   Procedure: ABDOMINAL Maxcine Ham;  Surgeon: Wellington Hampshire, MD;  Location: [facility];  Service: Cardiovascular;  Laterality: N/A;  . COLONOSCOPY  02/07/2016  . Laparoscopically-assisted Sigmoid Colectomy  11/10/2007  . LIVER BIOPSY     minimal active inflammation  . UPPER GASTROINTESTINAL ENDOSCOPY  02/07/2016    Allergies as of 07/22/2019   No Known Allergies     Medication List       Accurate as of July 22, 2019  2:23 PM. If you have any questions, ask your nurse or doctor.        ALPRAZolam 0.5 MG tablet Commonly known as: XANAX TAKE 1 TABLET BY MOUTH 2  TIMES DAILY.   Coenzyme Q10 10 MG capsule Take 10 mg by mouth daily.   D-Limonene 1 g Caps Take by mouth.   dexlansoprazole 60 MG capsule Commonly known as: Dexilant TAKE 1 CAPSULE BY MOUTH EVERY DAY.   famotidine 40 MG tablet Commonly known as: PEPCID TAKE 1 TABLET BY MOUTH EVERYDAY AT BEDTIME   Magnesium 500 MG Caps Take 1 capsule by mouth at bedtime.   psyllium 58.6 % powder Commonly known as: METAMUCIL Take 1 packet by mouth at bedtime.   rosuvastatin 20 MG tablet Commonly known as: CRESTOR TAKE 1 TABLET BY MOUTH EVERY DAY          Objective:   Physical Exam BP (!) 169/92 (BP Location: Left Arm, Patient Position: Sitting, Cuff Size: Normal)   Pulse 75   Temp 97.6 F (36.4 C) (Temporal)   Resp 16   Ht 5\' 9"  (1.753 m)   Wt 191 lb 2 oz (86.7 kg)   SpO2 100%   BMI 28.22 kg/m  General:   Well developed, NAD, BMI noted. HEENT:  Normocephalic . Face symmetric, atraumatic Lungs:  CTA B Normal respiratory effort, no intercostal retractions, no accessory muscle use. Heart: RRR,  no murmur.  Lower extremities: no pretibial edema bilaterally  Skin: Not pale.  Not jaundice Neurologic:  alert & oriented X3.  Speech normal, gait appropriate for age and unassisted Psych--  Cognition and judgment appear intact.  Cooperative with normal attention span and concentration.  Behavior appropriate. No anxious or depressed appearing.      Assessment    Assessment Anxiety Hyperlipidemia  PVD R leg, dx 2015,   claudication 10-2013, ABIs showed obstruction, saw [name], had a aortogram , was referred to surgery. They rx a CT chest show mild atherosclerosis disease of the aorta, coronary arthrosclerosis. Had a MRI of the right leg, see report. Was recommended a bypass but sx  improved, did not proceed. No benefit from cilostazol GI:  --Barrett's, GERD, last EGD,BX : 02-2016  --Abnormal LFTs: 2008 neg. hep. serology, (-) ANA, ceruloplasmin, etc.;  liver bx:  minimal  active inflammation Hearing loss, has aids  Sees dermatology q 6 ,months as off 12-2015 Stress 12-2013: Low risk Prostate BX 12-2016 (-)  PLAN: HTN: Previously BP was elevated, now has been consistently in the high side, thus has  HTN. We talk about the diagnosis, what it is, consequences of untreated hypertension, the need to eat low-salt, increase physical activity. On chart review potassium has been slightly elevated before. Plan: HCTZ 25 mg daily, BMP in 2 weeks.  Monitor BPs. If BP responds well, no side effects and follow-up BMP is okay, will refill his medication. Addendum: Due for an EKG, will do on RTC. Anxiety: On Xanax, check UDS   This visit occurred during the SARS-CoV-2 public health emergency.  Safety protocols were in place, including screening questions prior to the visit, additional usage of staff PPE, and extensive cleaning of exam room while observing appropriate contact time as indicated for disinfecting solutions.

## 2019-07-22 NOTE — Progress Notes (Signed)
Pre visit review using our clinic review tool, if applicable. No additional management support is needed unless otherwise documented below in the visit note. 

## 2019-07-23 NOTE — Assessment & Plan Note (Signed)
HTN: Previously BP was elevated, now has been consistently in the high side, thus has  HTN. We talk about the diagnosis, what it is, consequences of untreated hypertension, the need to eat low-salt, increase physical activity. On chart review potassium has been slightly elevated before. Plan: HCTZ 25 mg daily, BMP in 2 weeks.  Monitor BPs. If BP responds well, no side effects and follow-up BMP is okay, will refill his medication. Addendum: Due for an EKG, will do on RTC.

## 2019-07-23 NOTE — Assessment & Plan Note (Signed)
HTN: Previously BP was elevated, now has been consistently in the high side, thus has  HTN. We talk about the diagnosis, what it is, consequences of untreated hypertension, the need to eat low-salt, increase physical activity. On chart review potassium has been slightly elevated before. Plan: HCTZ 25 mg daily, BMP in 2 weeks.  Monitor BPs. If BP responds well, no side effects and follow-up BMP is okay, will refill his medication. Addendum: Due for an EKG, will do on RTC. Anxiety: On Xanax, check UDS

## 2019-08-05 ENCOUNTER — Other Ambulatory Visit: Payer: Self-pay | Admitting: Gastroenterology

## 2019-08-11 ENCOUNTER — Telehealth: Payer: Self-pay | Admitting: Gastroenterology

## 2019-08-11 MED ORDER — DEXILANT 60 MG PO CPDR: DELAYED_RELEASE_CAPSULE | ORAL | 0 refills | Status: DC

## 2019-08-11 NOTE — Telephone Encounter (Signed)
Prescription sent to patient's pharmacy and patient scheduled for my chart video visit for 09/23/19 at 2:30pm. Patient verbalized understanding.

## 2019-08-11 NOTE — Telephone Encounter (Signed)
Patient would like to schedule a my chart video appt for a GERD f/u, med refill. Dr. Fuller Plan is this ok?

## 2019-08-11 NOTE — Telephone Encounter (Signed)
OK. You, Sheri or someone will have to assist me at the time of the appt as I haven't used the my chart video visit system.

## 2019-08-12 ENCOUNTER — Other Ambulatory Visit: Payer: Self-pay

## 2019-08-12 ENCOUNTER — Other Ambulatory Visit (INDEPENDENT_AMBULATORY_CARE_PROVIDER_SITE_OTHER): Payer: BC Managed Care – PPO

## 2019-08-12 ENCOUNTER — Encounter: Payer: Self-pay | Admitting: Internal Medicine

## 2019-08-12 DIAGNOSIS — I10 Essential (primary) hypertension: Secondary | ICD-10-CM

## 2019-08-12 DIAGNOSIS — F411 Generalized anxiety disorder: Secondary | ICD-10-CM

## 2019-08-12 DIAGNOSIS — G4700 Insomnia, unspecified: Secondary | ICD-10-CM

## 2019-08-12 DIAGNOSIS — Z79899 Other long term (current) drug therapy: Secondary | ICD-10-CM

## 2019-08-12 LAB — BASIC METABOLIC PANEL
BUN: 12 mg/dL (ref 6–23)
CO2: 30 mEq/L (ref 19–32)
Calcium: 10.2 mg/dL (ref 8.4–10.5)
Chloride: 98 mEq/L (ref 96–112)
Creatinine, Ser: 1 mg/dL (ref 0.40–1.50)
GFR: 75.09 mL/min (ref >60.00)
Glucose, Bld: 98 mg/dL (ref 70–99)
Potassium: 4.6 mEq/L (ref 3.5–5.1)
Sodium: 138 mEq/L (ref 135–145)

## 2019-08-12 NOTE — Addendum Note (Signed)
Addended byDamita Dunnings D on: 08/12/2019 11:17 AM   Modules accepted: Orders

## 2019-08-13 MED ORDER — AMLODIPINE BESYLATE 5 MG PO TABS: 5.0000 mg | ORAL_TABLET | Freq: Every day | ORAL | 6 refills | Status: DC

## 2019-08-13 MED ORDER — HYDROCHLOROTHIAZIDE 25 MG PO TABS: 25.0000 mg | ORAL_TABLET | Freq: Every day | ORAL | 6 refills | Status: DC

## 2019-08-13 NOTE — Addendum Note (Signed)
Addended byDamita Dunnings D on: 08/13/2019 10:19 AM   Modules accepted: Orders

## 2019-08-27 ENCOUNTER — Encounter: Payer: Self-pay | Admitting: Internal Medicine

## 2019-09-01 ENCOUNTER — Other Ambulatory Visit: Payer: Self-pay | Admitting: Internal Medicine

## 2019-09-01 ENCOUNTER — Encounter: Payer: Self-pay | Admitting: Internal Medicine

## 2019-09-01 MED ORDER — CARVEDILOL 6.25 MG PO TABS: 6.2500 mg | ORAL_TABLET | Freq: Two times a day (BID) | ORAL | 3 refills | Status: DC

## 2019-09-02 ENCOUNTER — Other Ambulatory Visit: Payer: Self-pay | Admitting: Gastroenterology

## 2019-09-07 ENCOUNTER — Encounter: Payer: Self-pay | Admitting: Internal Medicine

## 2019-09-07 ENCOUNTER — Other Ambulatory Visit: Payer: Self-pay

## 2019-09-07 ENCOUNTER — Ambulatory Visit: Payer: BC Managed Care – PPO | Admitting: Internal Medicine

## 2019-09-07 VITALS — BP 155/88 | HR 88 | Temp 97.4°F | Resp 16 | Ht 69.0 in | Wt 192.0 lb

## 2019-09-07 DIAGNOSIS — I10 Essential (primary) hypertension: Secondary | ICD-10-CM

## 2019-09-07 DIAGNOSIS — R9431 Abnormal electrocardiogram [ECG] [EKG]: Secondary | ICD-10-CM

## 2019-09-07 DIAGNOSIS — R0789 Other chest pain: Secondary | ICD-10-CM

## 2019-09-07 MED ORDER — LOSARTAN POTASSIUM 25 MG PO TABS: 25.0000 mg | ORAL_TABLET | Freq: Every day | ORAL | 0 refills | Status: DC

## 2019-09-07 NOTE — Progress Notes (Signed)
Subjective:    Patient ID: Ryan Smith, male    DOB: Dec 14, 1955, 64 y.o.   MRN: [id number]  DOS:  09/07/2019 Type of visit - description: Acute Here for management of hypertension. Since the last visit he tried amlodipine and carvedilol. In both locations he developed chest tightness described as a discomfort at the upper chest, some radiation to the throat. Symptoms completely stopped after he discontinue above medicines. Symptoms were not exertional. No associated nausea or vomiting. No GERD. No shortness of breath. He continue taking walks and playing golf without apparent problems.    Review of Systems See above   Past Medical History:  Diagnosis Date  . Abnormal LFTs    2008 neg. hep. serology, (-) ANA, ceruloplasmin, etc.;  liver biopsy showed  minimal active inflammation  . Anxiety   . Barrett's esophagus   . Elevated PSA   . Esophageal reflux   . Hearing loss    has hearing aids  . Hiatal hernia   . History of diverticulitis of colon   . Personal history of colonic polyps    tublar adenomas 2008 & hyerplastic 2011  . PVD (peripheral vascular disease) ([place])   . SCC (squamous cell carcinoma)    L shoulder    Past Surgical History:  Procedure Laterality Date  . ABDOMINAL AORTAGRAM N/A 11/25/2013   Procedure: ABDOMINAL Maxcine Ham;  Surgeon: Wellington Hampshire, MD;  Location: [facility];  Service: Cardiovascular;  Laterality: N/A;  . COLONOSCOPY  02/07/2016  . Laparoscopically-assisted Sigmoid Colectomy  11/10/2007  . LIVER BIOPSY     minimal active inflammation  . UPPER GASTROINTESTINAL ENDOSCOPY  02/07/2016    Allergies as of 09/07/2019   No Known Allergies     Medication List       Accurate as of September 07, 2019 11:59 PM. If you have any questions, ask your nurse or doctor.        STOP taking these medications   carvedilol 6.25 MG tablet Commonly known as: COREG Stopped by: Kathlene November, MD     TAKE these medications   ALPRAZolam 0.5 MG tablet  Commonly known as: XANAX TAKE 1 TABLET BY MOUTH 2 TIMES DAILY.   aspirin EC 81 MG tablet Take 81 mg by mouth daily.   Coenzyme Q10 10 MG capsule Take 10 mg by mouth daily.   D-Limonene 1 g Caps Take by mouth.   Dexilant 60 MG capsule Generic drug: dexlansoprazole TAKE 1 CAPSULE BY MOUTH EVERY DAY   famotidine 40 MG tablet Commonly known as: PEPCID TAKE 1 TABLET BY MOUTH EVERYDAY AT BEDTIME   hydrochlorothiazide 25 MG tablet Commonly known as: HYDRODIURIL Take 1 tablet (25 mg total) by mouth daily.   losartan 25 MG tablet Commonly known as: COZAAR Take 1 tablet (25 mg total) by mouth daily. Started by: Kathlene November, MD   Magnesium 500 MG Caps Take 1 capsule by mouth at bedtime.   psyllium 58.6 % powder Commonly known as: METAMUCIL Take 1 packet by mouth at bedtime.   rosuvastatin 20 MG tablet Commonly known as: CRESTOR TAKE 1 TABLET BY MOUTH EVERY DAY          Objective:   Physical Exam BP (!) 155/88 (BP Location: Left Arm, Patient Position: Sitting, Cuff Size: Normal)   Pulse 88   Temp (!) 97.4 F (36.3 C) (Temporal)   Resp 16   Ht 5\' 9"  (1.753 m)   Wt 192 lb (87.1 kg)   SpO2 100%  BMI 28.35 kg/m  General:   Well developed, NAD, BMI noted. HEENT:  Normocephalic . Face symmetric, atraumatic Lungs:  CTA B Normal respiratory effort, no intercostal retractions, no accessory muscle use. Heart: RRR,  no murmur.  Lower extremities: no pretibial edema bilaterally  Skin: Not pale. Not jaundice Neurologic:  alert & oriented X3.  Speech normal, gait appropriate for age and unassisted Psych--  Cognition and judgment appear intact.  Cooperative with normal attention span and concentration.  Behavior appropriate. No anxious or depressed appearing.      Assessment    Assessment HTN: dx 07/2019 (intolerant amlodipine, carvedilol, see OV 09/07/2019) Anxiety Hyperlipidemia  PVD R leg, dx 2015,   claudication 10-2013, ABIs showed obstruction, saw [name],  had a aortogram , was referred to surgery. They rx a CT chest show mild atherosclerosis disease of the aorta, coronary arthrosclerosis. Had a MRI of the right leg, see report. Was recommended a bypass but sx  improved, did not proceed. No benefit from cilostazol GI:  --Barrett's, GERD, last EGD,BX : 02-2016  --Abnormal LFTs: 2008 neg. hep. serology, (-) ANA, ceruloplasmin, etc.;  liver bx:  minimal active inflammation Hearing loss, has aids  Sees dermatology q 6 ,months as off 12-2015 Stress 12-2013: Low risk Prostate BX 12-2016 (-)  PLAN: HTN Since the last visit, he tolerated HCTZ well, but BP was still elevated. Amlodipine added 08/12/2019, but d/c on 09/01/2019 d/t s/e: (increased heart rate, dull headache, today he also reports chest tightness). Then was Rx carvedilol, took it for 3 days, BP was very good but again developed chest tightness, symptoms completely resolved after he stopped it. Currently asymptomatic. EKG today: NSR, lateral TWI noted, new compared to 2015. Plan: Aspirin 81 Cardiology referral due to chest tightness, FH CAD, personal history of PAD ER if severe chest tightness Needs additional BP control, intolerant to amlodipine, carvedilol, baseline potassium is slightly elevated, plan: low dose >>>Losartan 25, CMP in 2 weeks. In the future I think it will be okay to consider a different CCB or BB if needed Chest tightness: See above RTC 3 months   This visit occurred during the SARS-CoV-2 public health emergency.  Safety protocols were in place, including screening questions prior to the visit, additional usage of staff PPE, and extensive cleaning of exam room while observing appropriate contact time as indicated for disinfecting solutions.

## 2019-09-07 NOTE — Patient Instructions (Signed)
Start taking losartan  Start aspirin 81 mg 1 tablet every day  Continue checking your blood pressures BP GOAL is between 110/65 and  135/85. If it is consistently higher or lower, let me know  We are referring you to cardiology for the chest tightness  If he has severe chest tightness, go to the ER  GO TO [place], [place] back for blood work in 2 weeks  Come back for a checkup in 3 months

## 2019-09-07 NOTE — Progress Notes (Signed)
Pre visit review using our clinic review tool, if applicable. No additional management support is needed unless otherwise documented below in the visit note. 

## 2019-09-08 NOTE — Assessment & Plan Note (Signed)
HTN Since the last visit, he tolerated HCTZ well, but BP was still elevated. Amlodipine added 08/12/2019, but d/c on 09/01/2019 d/t s/e: (increased heart rate, dull headache, today he also reports chest tightness). Then was Rx carvedilol, took it for 3 days, BP was very good but again developed chest tightness, symptoms completely resolved after he stopped it. Currently asymptomatic. EKG today: NSR, lateral TWI noted, new compared to 2015. Plan: Aspirin 81 Cardiology referral due to chest tightness, FH CAD, personal history of PAD ER if severe chest tightness Needs additional BP control, intolerant to amlodipine, carvedilol, baseline potassium is slightly elevated, plan: low dose >>>Losartan 25, CMP in 2 weeks. In the future I think it will be okay to consider a different CCB or BB if needed Chest tightness: See above RTC 3 months

## 2019-09-20 NOTE — Progress Notes (Signed)
Cardiology Office Note:    Date:  09/21/2019   ID:  Ryan Smith, DOB [birth year], MRN [id number]  PCP:  Colon Branch, MD  Cardiologist:  Shirlee More, MD   Referring MD: Colon Branch, MD  ASSESSMENT:    1. Chest pain of uncertain etiology   2. PVD (peripheral vascular disease) with claudication ([place])   3. Hypertension, essential   4. Hyperlipidemia, unspecified hyperlipidemia type    PLAN:    In order of problems listed above:  1. His symptoms are atypical but he is high risk and after discussion of modalities undergo cardiac CTA for calcium score and CT angiography presence or absence of CAD and define severity.  Continue medical treatment including aspirin statin and I gave him a prescription for nitroglycerin to use as needed 2. Stable he is done well and has very low claudication in a regular walking program 3. Stable recently placed on ARB 4. Continue statin lipids are at target  Next appointment 6 weeks   Medication Adjustments/Labs and Tests Ordered: Current medicines are reviewed at length with the patient today.  Concerns regarding medicines are outlined above.  Orders Placed This Encounter  Procedures  . CT CORONARY MORPH W/CTA COR W/SCORE W/CA W/CM &/OR WO/CM  . CT CORONARY FRACTIONAL FLOW RESERVE DATA PREP  . CT CORONARY FRACTIONAL FLOW RESERVE FLUID ANALYSIS  . Basic metabolic panel  . EKG 12-Lead   Meds ordered this encounter  Medications  . metoprolol tartrate (LOPRESSOR) 100 MG tablet    Sig: Take 1 tablet (100 mg total) by mouth once for 1 dose. Take two hours prior to cardiac CT    Dispense:  1 tablet    Refill:  0  . nitroGLYCERIN (NITROSTAT) 0.4 MG SL tablet    Sig: Place 1 tablet (0.4 mg total) under the tongue every 5 (five) minutes as needed for chest pain.    Dispense:  90 tablet    Refill:  3     No chief complaint on file.   History of Present Illness:    Ryan Smith is a 64 y.o. male with hypertension hyperlipidemia and PAD with RLE  popliteal entrapment syndrome who is being seen today for the evaluation of chest pain at the request of Colon Branch, MD.  He has done well with his lower extremity arterial disease then walked 2 months without claudication.  About 3 weeks ago he is having nonexertional chest pain described as a fullness indigestion substernal up to his throat generally without activity but did occur on the golf [place] and at times could be very vague mild to moderate and lasted for hours.  His blood pressure medication was changed stopping a beta-blocker and he has had no recurrence.  It was not predictable and not typical angina.  No associated GI symptoms no diaphoresis was not pleuritic and he was not short of breath.  He has no known congenital rheumatic heart disease.  Past Medical History:  Diagnosis Date  . Abnormal LFTs    2008 neg. hep. serology, (-) ANA, ceruloplasmin, etc.;  liver biopsy showed  minimal active inflammation  . Anxiety   . Barrett's esophagus   . Elevated PSA   . Esophageal reflux   . Hearing loss    has hearing aids  . Hiatal hernia   . History of diverticulitis of colon   . Personal history of colonic polyps    tublar adenomas 2008 & hyerplastic 2011  . PVD (peripheral vascular  disease) ([place])   . SCC (squamous cell carcinoma)    L shoulder    Past Surgical History:  Procedure Laterality Date  . ABDOMINAL AORTAGRAM N/A 11/25/2013   Procedure: ABDOMINAL Maxcine Ham;  Surgeon: Wellington Hampshire, MD;  Location: [facility];  Service: Cardiovascular;  Laterality: N/A;  . COLONOSCOPY  02/07/2016  . Laparoscopically-assisted Sigmoid Colectomy  11/10/2007  . LIVER BIOPSY     minimal active inflammation  . UPPER GASTROINTESTINAL ENDOSCOPY  02/07/2016    Current Medications: Current Meds  Medication Sig  . ALPRAZolam (XANAX) 0.5 MG tablet TAKE 1 TABLET BY MOUTH 2 TIMES DAILY.  Marland Kitchen aspirin EC 81 MG tablet Take 81 mg by mouth daily.  . Coenzyme Q10 10 MG capsule Take 10 mg by mouth  daily.  Marland Kitchen DEXILANT 60 MG capsule TAKE 1 CAPSULE BY MOUTH EVERY DAY  . famotidine (PEPCID) 40 MG tablet TAKE 1 TABLET BY MOUTH EVERYDAY AT BEDTIME  . hydrochlorothiazide (HYDRODIURIL) 25 MG tablet Take 1 tablet (25 mg total) by mouth daily.  Marland Kitchen losartan (COZAAR) 25 MG tablet Take 1 tablet (25 mg total) by mouth daily.  . Magnesium 500 MG CAPS Take 1 capsule by mouth at bedtime.    Jenne Pane (D-LIMONENE) 1 g CAPS Take by mouth.  . psyllium (METAMUCIL) 58.6 % powder Take 1 packet by mouth at bedtime.   . rosuvastatin (CRESTOR) 20 MG tablet TAKE 1 TABLET BY MOUTH EVERY DAY     Allergies:   Patient has no known allergies.   Social History   Socioeconomic History  . Marital status: Married    Spouse name: Not on file  . Number of children: 2  . Years of education: Not on file  . Highest education level: Not on file  Occupational History  . 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 Smoking status: Former Smoker    Years: 40.00    Types: Cigarettes    Quit date: 02/26/2019    Years since quitting: 0.5  . Smokeless tobacco: Never Used  . Tobacco comment:    Vaping Use  . Vaping Use: Never used  Substance and Sexual Activity  . Alcohol use: Yes    Alcohol/week: 20.0 standard drinks    Types: 20 Glasses of wine per week    Comment: 1~ 2 glasses wine glasses daily  . Drug use: No  . Sexual activity: Not on file  Other Topics Concern  . Not on file  Social History Narrative   Born in [place]   Married, 2 step sons (adults)   Social Determinants of Health   Financial Resource Strain:   . Difficulty of Paying Living Expenses:   Food Insecurity:   . Worried About Charity fundraiser in the Last Year:   . Arboriculturist in the Last Year:   Transportation Needs:   . Film/video editor (Medical):   Marland Kitchen Lack of Transportation (Non-Medical):   Physical Activity:   . Days of Exercise per Week:   . Minutes of  Exercise per Session:   Stress:   . Feeling of Stress :   Social Connections:   . Frequency of Communication with Friends and Family:   . Frequency of Social Gatherings with Friends and Family:   . Attends Religious Services:   . Active Member of Clubs or Organizations:   . Attends Archivist Meetings:   Marland Kitchen Marital Status:  Family History: The patient's family history includes Coronary artery disease in his father; Diverticulitis in his mother; Esophageal cancer in his father; Heart disease in his father and paternal uncle; Prostate cancer (age of onset: 31) in his father. There is no history of Colon cancer, Diabetes, Rectal cancer, Stomach cancer, or Colon polyps.  ROS:   Review of Systems  Constitutional: Negative.  HENT: Negative.   Eyes: Negative.   Cardiovascular: Positive for chest pain.  Respiratory: Negative.   Endocrine: Negative.   Hematologic/Lymphatic: Negative.   Skin: Negative.   Musculoskeletal: Negative.   Gastrointestinal: Negative.   Neurological: Negative.   Psychiatric/Behavioral: Negative.   Allergic/Immunologic: Negative.    Please see the history of present illness.     All other systems reviewed and are negative.  EKGs/Labs/Other Studies Reviewed:    The following studies were reviewed today:   EKG:  EKG is  ordered today.  The ekg ordered today is personally reviewed and demonstrates sinus rhythm normal  Recent Labs: 03/03/2019: Hemoglobin 16.7; Platelets 358.0 04/10/2019: ALT 73 08/12/2019: BUN 12; Creatinine, Ser 1.00; Potassium 4.6; Sodium 138  Recent Lipid Panel    Component Value Date/Time   CHOL 183 04/10/2019 1028   TRIG 156.0 (H) 04/10/2019 1028   HDL 44.70 04/10/2019 1028   CHOLHDL 4 04/10/2019 1028   VLDL 31.2 04/10/2019 1028   LDLCALC 107 (H) 04/10/2019 1028   LDLDIRECT 145.2 04/10/2011 0936    Physical Exam:    VS:  BP (!) 148/90   Pulse 82   Ht 5\' 9"  (1.753 m)   Wt 193 lb (87.5 kg)   SpO2 97%   BMI 28.50  kg/m     Wt Readings from Last 3 Encounters:  09/21/19 193 lb (87.5 kg)  09/07/19 192 lb (87.1 kg)  07/22/19 191 lb 2 oz (86.7 kg)     GEN:  Well nourished, well developed in no acute distress HEENT: Normal NECK: No JVD; No carotid bruits LYMPHATICS: No lymphadenopathy CARDIAC: RRR, no murmurs, rubs, gallops RESPIRATORY:  Clear to auscultation without rales, wheezing or rhonchi  ABDOMEN: Soft, non-tender, non-distended MUSCULOSKELETAL:  No edema; No deformity  SKIN: Warm and dry NEUROLOGIC:  Alert and oriented x 3 PSYCHIATRIC:  Normal affect     Signed, Shirlee More, MD  09/21/2019 2:59 PM    [place]

## 2019-09-21 ENCOUNTER — Encounter: Payer: Self-pay | Admitting: Cardiology

## 2019-09-21 ENCOUNTER — Telehealth: Payer: BC Managed Care – PPO | Admitting: Gastroenterology

## 2019-09-21 ENCOUNTER — Ambulatory Visit: Payer: BC Managed Care – PPO | Admitting: Cardiology

## 2019-09-21 ENCOUNTER — Other Ambulatory Visit: Payer: Self-pay

## 2019-09-21 VITALS — BP 148/90 | HR 82 | Ht 69.0 in | Wt 193.0 lb

## 2019-09-21 DIAGNOSIS — R079 Chest pain, unspecified: Secondary | ICD-10-CM | POA: Diagnosis not present

## 2019-09-21 DIAGNOSIS — I10 Essential (primary) hypertension: Secondary | ICD-10-CM

## 2019-09-21 DIAGNOSIS — E785 Hyperlipidemia, unspecified: Secondary | ICD-10-CM | POA: Diagnosis not present

## 2019-09-21 DIAGNOSIS — I739 Peripheral vascular disease, unspecified: Secondary | ICD-10-CM | POA: Diagnosis not present

## 2019-09-21 MED ORDER — NITROGLYCERIN 0.4 MG SL SUBL
0.4000 mg | SUBLINGUAL_TABLET | SUBLINGUAL | 3 refills | Status: DC | PRN
Start: 2019-09-21 — End: 2021-06-22

## 2019-09-21 MED ORDER — METOPROLOL TARTRATE 100 MG PO TABS
100.0000 mg | ORAL_TABLET | Freq: Once | ORAL | 0 refills | Status: DC
Start: 2019-09-21 — End: 2019-09-23

## 2019-09-21 NOTE — Patient Instructions (Signed)
Medication Instructions:  Your physician has recommended you make the following change in your medication:  START: Nitroglycerin 0.4 mg take one tablet by mouth every 5 minutes up to three times as needed for chest pain.  *If you need a refill on your cardiac medications before your next appointment, please call your pharmacy*   Lab Work: Your physician recommends that you return for lab work within 1 week before you scheduled CT: BMP If you have labs (blood work) drawn today and your tests are completely normal, you will receive your results only by: Marland Kitchen MyChart Message (if you have MyChart) OR . A paper copy in the mail If you have any lab test that is abnormal or we need to change your treatment, we will call you to review the results.   Testing/Procedures: Your cardiac CT will be scheduled at the below location:   [facility] [address] [place], [place] [address] [phone]   If scheduled at [facility], please arrive at the [facility] main entrance of [facility] 30 minutes prior to test start time. Proceed to the [facility] Radiology Department (first floor) to check-in and test prep.  Please follow these instructions carefully (unless otherwise directed):  Hold all erectile dysfunction medications at least 3 days (72 hrs) prior to test.  On the Night Before the Test: . Be sure to Drink plenty of water. . Do not consume any caffeinated/decaffeinated beverages or chocolate 12 hours prior to your test. . Do not take any antihistamines 12 hours prior to your test.  On the Day of the Test: . Drink plenty of water. Do not drink any water within one hour of the test. . Do not eat any food 4 hours prior to the test. . You may take your regular medications prior to the test.  . Take metoprolol (Lopressor) two hours prior to test. . HOLD Hydrochlorothiazide morning of the test.       After the Test: . Drink plenty of water. . After  receiving IV contrast, you may experience a mild flushed feeling. This is normal. . On occasion, you may experience a mild rash up to 24 hours after the test. This is not dangerous. If this occurs, you can take Benadryl 25 mg and increase your fluid intake. . If you experience trouble breathing, this can be serious. If it is severe call 911 IMMEDIATELY. If it is mild, please call our office. . If you take any of these medications: Glipizide/Metformin, Avandament, Glucavance, please do not take 48 hours after completing test unless otherwise instructed.   Once we have confirmed authorization from your insurance company, we will call you to set up a date and time for your test.   For non-scheduling related questions, please contact the cardiac imaging nurse navigator should you have any questions/concerns: Marchia Bond, Cardiac Imaging Nurse Navigator Burley Saver, Interim Cardiac Imaging Nurse [place] [facility] Direct Office Dial: [phone]   For scheduling needs, including cancellations and rescheduling, please call [phone].      Follow-Up: At [facility], you and your health needs are our priority.  As part of our continuing mission to provide you with exceptional heart care, we have created designated Provider Care Teams.  These Care Teams include your primary Cardiologist (physician) and Advanced Practice Providers (APPs -  Physician Assistants and Nurse Practitioners) who all work together to provide you with the care you need, when you need it.  We  recommend signing up for the patient portal called "MyChart".  Sign up information is provided on this After Visit Summary.  MyChart is used to connect with patients for Virtual Visits (Telemedicine).  Patients are able to view lab/test results, encounter notes, upcoming appointments, etc.  Non-urgent messages can be sent to your provider as well.   To learn more about what you can do with MyChart, go to  [email or web address].    Your next appointment:   6 week(s)  The format for your next appointment:   In Person  Provider:   Shirlee More, MD   Other Instructions

## 2019-09-22 ENCOUNTER — Other Ambulatory Visit (INDEPENDENT_AMBULATORY_CARE_PROVIDER_SITE_OTHER): Payer: BC Managed Care – PPO

## 2019-09-22 DIAGNOSIS — I10 Essential (primary) hypertension: Secondary | ICD-10-CM | POA: Diagnosis not present

## 2019-09-22 DIAGNOSIS — Z79899 Other long term (current) drug therapy: Secondary | ICD-10-CM

## 2019-09-22 DIAGNOSIS — G4700 Insomnia, unspecified: Secondary | ICD-10-CM

## 2019-09-22 DIAGNOSIS — F411 Generalized anxiety disorder: Secondary | ICD-10-CM

## 2019-09-22 LAB — COMPREHENSIVE METABOLIC PANEL
ALT: 48 U/L (ref 0–53)
AST: 33 U/L (ref 0–37)
Albumin: 4.5 g/dL (ref 3.5–5.2)
Alkaline Phosphatase: 79 U/L (ref 39–117)
BUN: 11 mg/dL (ref 6–23)
CO2: 33 mEq/L — ABNORMAL HIGH (ref 19–32)
Calcium: 10.2 mg/dL (ref 8.4–10.5)
Chloride: 100 mEq/L (ref 96–112)
Creatinine, Ser: 0.97 mg/dL (ref 0.40–1.50)
GFR: 77.75 mL/min (ref >60.00)
Glucose, Bld: 104 mg/dL — ABNORMAL HIGH (ref 70–99)
Potassium: 5.1 mEq/L (ref 3.5–5.1)
Sodium: 139 mEq/L (ref 135–145)
Total Bilirubin: 0.5 mg/dL (ref 0.2–1.2)
Total Protein: 6.7 g/dL (ref 6.0–8.3)

## 2019-09-23 ENCOUNTER — Encounter: Payer: Self-pay | Admitting: Gastroenterology

## 2019-09-23 ENCOUNTER — Telehealth (INDEPENDENT_AMBULATORY_CARE_PROVIDER_SITE_OTHER): Payer: BC Managed Care – PPO | Admitting: Gastroenterology

## 2019-09-23 VITALS — Ht 69.0 in | Wt 187.0 lb

## 2019-09-23 DIAGNOSIS — K219 Gastro-esophageal reflux disease without esophagitis: Secondary | ICD-10-CM

## 2019-09-23 DIAGNOSIS — Z86010 Personal history of colonic polyps: Secondary | ICD-10-CM | POA: Diagnosis not present

## 2019-09-23 DIAGNOSIS — K2270 Barrett's esophagus without dysplasia: Secondary | ICD-10-CM

## 2019-09-23 MED ORDER — DEXILANT 60 MG PO CPDR: 60.0000 mg | DELAYED_RELEASE_CAPSULE | Freq: Every day | ORAL | 11 refills | Status: DC

## 2019-09-23 NOTE — Progress Notes (Signed)
    History of Present Illness: This is a 64 year old male returning for follow-up of GERD and short segment Barrett's without dysplasia.  Biopsies at his endoscopy in September 2020 did not reveal Barrett's however previous biopsies have.  A small hiatal hernia has been noted on prior EGDs however it was not noted on his last EGD.  His reflux symptoms have generally been well controlled on his current regimen.  He states his insurance company notified him that famotidine would no longer be covered so he needs to buy famotidine over-the-counter.   Current Medications, Allergies, Past Medical History, Past Surgical History, Family History and Social History were reviewed in [employer] record.   Telehealth Limited Exam: General: Well developed, well nourished, no acute distress Head: Normocephalic and atraumatic Eyes:  sclerae anicteric, EOMI Ears: Normal auditory acuity Psychological:  Alert and cooperative. Normal mood and affect   Assessment and Recommendations:  1.  GERD and short segment Barrett's esophagus without dysplasia.  Surveillance EGD recommended in September 2023.  Follow antireflux measures long-term.  Continue Dexilant 60 mg p.o. every morning and obtain famotidine OTC 20 mg 2 p.o. every evening.  REV in 1 year.  2.  Personal history of adenomatous colon polyps.  5-year interval surveillance colonoscopy is recommended in November 2022.   These services were provided via telemedicine, audio and video.  The patient was at home and the provider was in the office, alone.  We discussed the limitations of evaluation and management by telemedicine and the availability of in person appointments.  Patient consented for this telemedicine visit and is aware of possible charges for this service.  Office CMA or LPN participated in this telemedicine service.  Time spent on call: 8 minutes

## 2019-09-23 NOTE — Patient Instructions (Signed)
Continue Dexilant once daily.   Continue Famotidine 20mg ( 2 p.o) over the counter every evening.   Follow-up in 1 year.   We have sent the following medications to your pharmacy for you to pick up at your convenience: Dexilant  If you are age 65 or older, your body mass index should be between 23-30. Your Body mass index is 27.62 kg/m. If this is out of the aforementioned range listed, please consider follow up with your Primary Care Provider.  If you are age 36 or younger, your body mass index should be between 19-25. Your Body mass index is 27.62 kg/m. If this is out of the aformentioned range listed, please consider follow up with your Primary Care Provider.    Thank you for choosing me and [facility].  Pricilla Riffle. Dagoberto Ligas., MD., Marval Regal

## 2019-09-24 LAB — DRUG MONITORING, PANEL 8 WITH CONFIRMATION, URINE
6 Acetylmorphine: NEGATIVE ng/mL (ref <10)
Alcohol Metabolites: POSITIVE ng/mL — AB
Amphetamines: NEGATIVE ng/mL (ref <500)
Benzodiazepines: NEGATIVE ng/mL (ref <100)
Buprenorphine, Urine: NEGATIVE ng/mL (ref <5)
Cocaine Metabolite: NEGATIVE ng/mL (ref <150)
Creatinine: 15.7 mg/dL
Ethyl Glucuronide (ETG): 1083 ng/mL — ABNORMAL HIGH (ref <500)
Ethyl Sulfate (ETS): 457 ng/mL — ABNORMAL HIGH (ref <100)
MDMA: NEGATIVE ng/mL (ref <500)
Marijuana Metabolite: NEGATIVE ng/mL (ref <20)
Opiates: NEGATIVE ng/mL (ref <100)
Oxidant: NEGATIVE ug/mL
Oxycodone: NEGATIVE ng/mL (ref <100)
Specific Gravity: 1.003 (ref >=1.0)
pH: 7.4 (ref 4.5–9.0)

## 2019-09-24 LAB — DM TEMPLATE

## 2019-09-29 ENCOUNTER — Other Ambulatory Visit: Payer: Self-pay | Admitting: Internal Medicine

## 2019-10-03 ENCOUNTER — Telehealth: Payer: Self-pay | Admitting: Internal Medicine

## 2019-10-06 NOTE — Telephone Encounter (Signed)
Alprazolam refill.   Last OV: 09/07/2019 Last Fill: 06/12/2019 #60 and 2RF Pt sig: 1 tab bid UDS: 09/22/2019 Moderate risk

## 2019-10-06 NOTE — Telephone Encounter (Signed)
PDMP okay, Rx sent Next UDS 01-2020 at the time of OV

## 2019-10-15 ENCOUNTER — Telehealth (HOSPITAL_COMMUNITY): Payer: Self-pay | Admitting: *Deleted

## 2019-10-15 NOTE — Telephone Encounter (Signed)
Reaching out to patient to offer assistance regarding upcoming cardiac imaging study; pt verbalizes understanding of appt date/time, parking situation and where to check in, pre-test NPO status and medications ordered, and verified current allergies; name and call back number provided for further questions should they arise  Kieran Nachtigal Tai RN Navigator Cardiac Imaging [facility] [phone] office [phone] cell 

## 2019-10-16 ENCOUNTER — Encounter: Payer: BC Managed Care – PPO | Admitting: *Deleted

## 2019-10-16 ENCOUNTER — Ambulatory Visit (HOSPITAL_COMMUNITY)
Admission: RE | Admit: 2019-10-16 | Discharge: 2019-10-16 | Disposition: A | Discharge status: Home / Self Care | Payer: BC Managed Care – PPO | Source: Ambulatory Visit | Attending: Cardiology | Admitting: Cardiology

## 2019-10-16 ENCOUNTER — Other Ambulatory Visit: Payer: Self-pay

## 2019-10-16 DIAGNOSIS — R079 Chest pain, unspecified: Secondary | ICD-10-CM

## 2019-10-16 DIAGNOSIS — Z006 Encounter for examination for normal comparison and control in clinical research program: Secondary | ICD-10-CM

## 2019-10-16 IMAGING — CT CT HEART MORP W/ CTA COR W/ SCORE W/ CA W/CM &/OR W/O CM
4 of 7 series · 8 of 20 positions shown, 9 images · IV contrast (omnipaque)
Comparison: 12/30/2013
COMPARISON: 12/30/2013

Addendum:
EXAM:
OVER-READ INTERPRETATION  CT CHEST

The following report is an over-read performed by radiologist Dr.
Chatito Trucco [REDACTED] on 10/16/2019. This over-read
does not include interpretation of cardiac or coronary anatomy or
pathology. The coronary CTA interpretation by the cardiologist is
attached.
HISTORY: 63 yo chest pain/anginal equiv, 10yr CHD risk > 20%, not treadmill
candidate
Cardiac/Coronary CTA
TECHNIQUE: The patient was scanned on a Siemens Force scanner.
PROTOCOL: A 120 kV prospective scan was triggered in the descending thoracic
aorta at 111 HU's. Axial non-contrast 3 mm slices were carried out
through the heart. The data set was analyzed on a dedicated work
station and scored using the Agatson method. Gantry rotation speed
was 250 msecs and collimation was .6 mm. Beta blockade and 0.8 mg of
sl NTG was given. The 3D data set was reconstructed in 5% intervals
of the 67-82 % of the R-R cycle. Diastolic phases were analyzed on a
dedicated work station using MPR, MIP and VRT modes. The patient
received 80mL OMNIPAQUE IOHEXOL 350 MG/ML SOLN of contrast.

[Series 7: best diast 73 % · axial · 0.39mm/px · z∈[-158,-118]mm · 2 of 301 slices shown, 3 images]
[im 101/301  vessel]
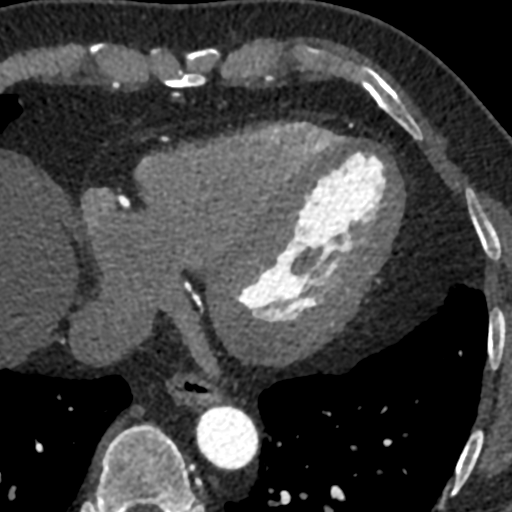
[im 101/301  lung]
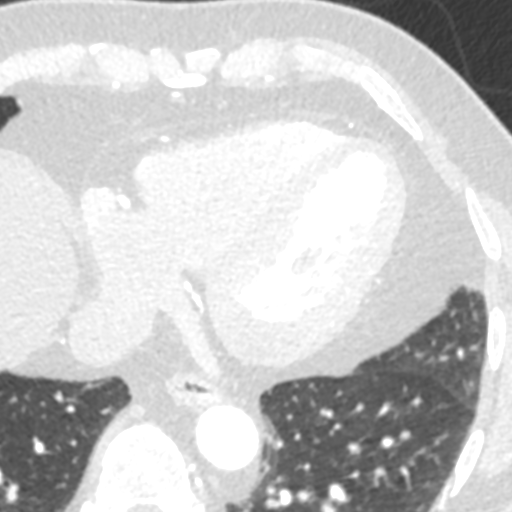
[im 201/301  vessel]
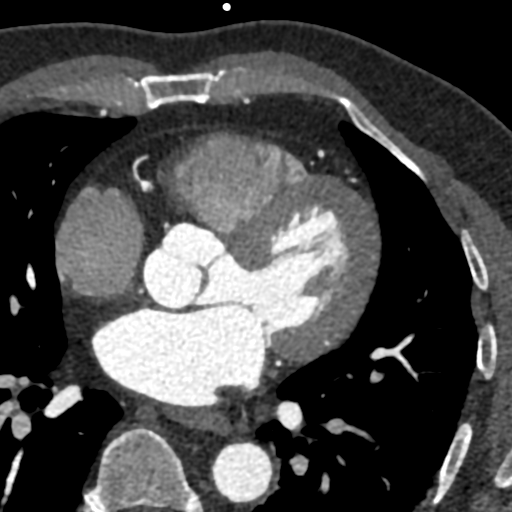

[Series 10: best syst 33 % · axial · 0.39mm/px · z∈[-158,-118]mm · 2 of 301 slices shown]
[im 101/301  vessel]
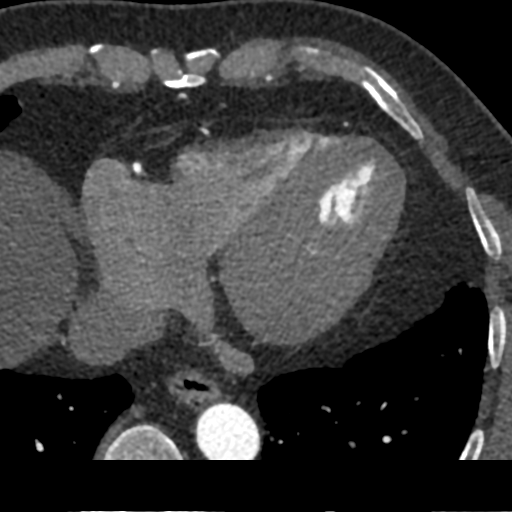
[im 201/301  vessel]
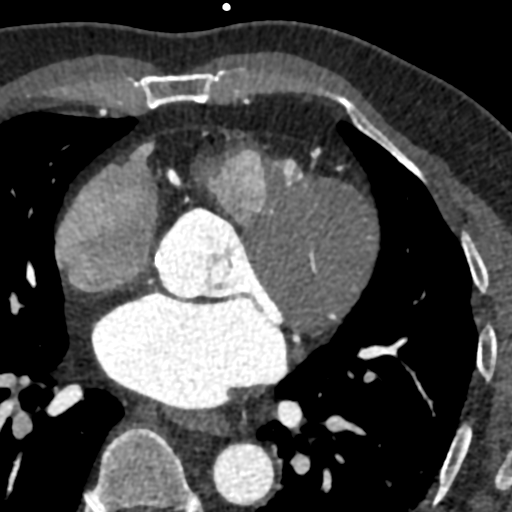

[Series 11: ts diast sharp 73 % · axial · 0.39mm/px · z∈[-158,-118]mm · 2 of 301 slices shown]
[im 101/301  lung]
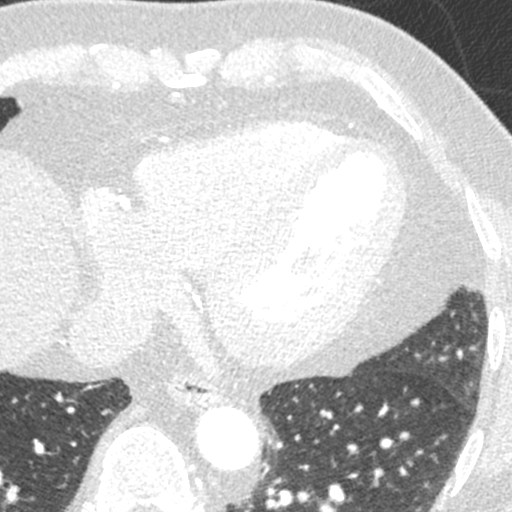
[im 201/301  lung]
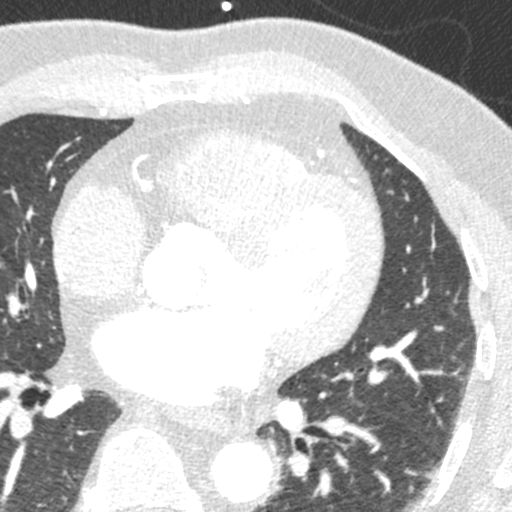

[Series 12: ts syst sharp 33 % · axial · 0.39mm/px · z∈[-158,-118]mm · 2 of 301 slices shown]
[im 101/301  lung]
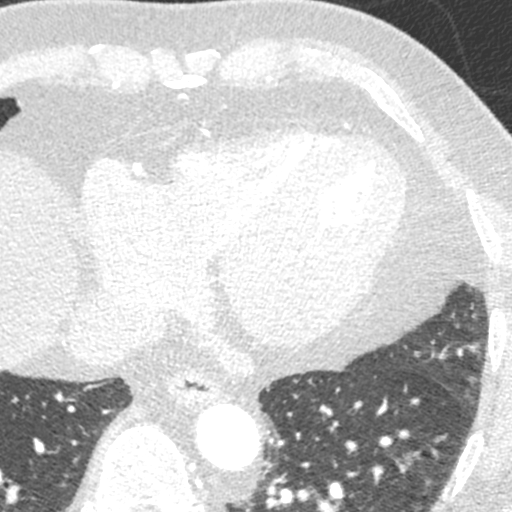
[im 201/301  lung]
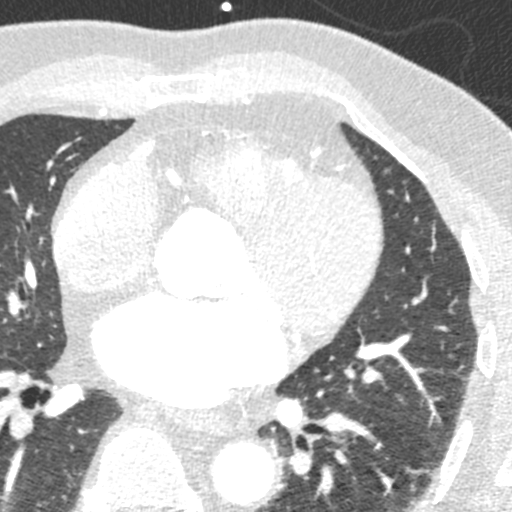

[8 of 20 positions shown; findings below may reference images not displayed]

FINDINGS: Vascular: Heart is borderline in size.  Aorta normal caliber.

Mediastinum/Nodes: No adenopathy.

Lungs/Pleura: No confluent opacities or effusions.

Upper Abdomen: Imaging into the upper abdomen shows no acute
findings.

Musculoskeletal: Chest wall soft tissues are unremarkable. No acute
bony abnormality.
IMPRESSION: No acute or significant extracardiac abnormality.
FINDINGS: Quality: Good, HR 55

Coronary calcium score: The patient's coronary artery calcium score
is 206, which places the patient in the 70th percentile.

Coronary arteries: Normal coronary origins.  Right dominance.

Right Coronary Artery: Dominant. Minimal mixed 1-24% proximal
stenosis (SRBCRB0B).

Left Main Coronary Artery: Minimal eccentric non-calcified 1-24%
distal stenosis (SRBCRB0B). Bifurcates into the LAD and LCx
stenosis.

Left Anterior Descending Coronary Artery: Mild mixed proximal 25-49%
proximal stenosis. Moderate 50-69% mid vessel mixed stenosis
(50-69%) at the bifurcation of the D4 vessel (largest diagonal).
There are 4 proximal diagonal vessels without disease.

Left Circumflex Artery: AV groove vessel. Minimal mixed 1-24%
proximal stenosis. Large proximal OM1 vessel without disease.

Aorta: Normal size, 28 mm at the mid ascending aorta (level of the
PA bifurcation) measured double oblique. No calcifications. No
dissection.

Aortic Valve: Trileaflet.  No calcifications.

Other findings:

Normal pulmonary vein drainage into the left atrium.

Normal left atrial appendage without a thrombus.

Normal size of the pulmonary artery.
IMPRESSION: 1. Mild to moderate, likely non-obstructive mixed CAD predominantly
in the LAD, CADRADS = 3. CT FFR will be performed and reported
separately.

2. Coronary calcium score of 206. This was 70th percentile for age
and sex matched control.

3. Normal coronary origin with right dominance.

*** End of Addendum ***
EXAM:
OVER-READ INTERPRETATION  CT CHEST

The following report is an over-read performed by radiologist Dr.
Chatito Trucco [REDACTED] on 10/16/2019. This over-read
does not include interpretation of cardiac or coronary anatomy or
pathology. The coronary CTA interpretation by the cardiologist is
attached.
FINDINGS: Vascular: Heart is borderline in size.  Aorta normal caliber.

Mediastinum/Nodes: No adenopathy.

Lungs/Pleura: No confluent opacities or effusions.

Upper Abdomen: Imaging into the upper abdomen shows no acute
findings.

Musculoskeletal: Chest wall soft tissues are unremarkable. No acute
bony abnormality.
IMPRESSION: No acute or significant extracardiac abnormality.

## 2019-10-16 MED ORDER — NITROGLYCERIN 0.4 MG SL SUBL
SUBLINGUAL_TABLET | SUBLINGUAL | Status: AC
  Filled 2019-10-16: qty 2

## 2019-10-16 MED ORDER — IOHEXOL 350 MG/ML SOLN
80.0000 mL | Freq: Once | INTRAVENOUS | Status: AC | PRN
  Administered 2019-10-16: 80 mL via INTRAVENOUS

## 2019-10-16 MED ORDER — NITROGLYCERIN 0.4 MG SL SUBL
0.8000 mg | SUBLINGUAL_TABLET | Freq: Once | SUBLINGUAL | Status: AC
  Administered 2019-10-16: 0.8 mg via SUBLINGUAL

## 2019-10-16 MED ORDER — METOPROLOL TARTRATE 5 MG/5ML IV SOLN: 5.0000 mg | INTRAVENOUS | Status: DC | PRN

## 2019-10-16 NOTE — Research (Signed)
CADFEM Informed Consent                  Subject Name:   Ryan Smith   Subject met inclusion and exclusion criteria.  The informed consent form, study requirements and expectations were reviewed with the subject and questions and concerns were addressed prior to the signing of the consent form.  The subject verbalized understanding of the trial requirements.  The subject agreed to participate in the CADFEM trial and signed the informed consent.  The informed consent was obtained prior to performance of any protocol-specific procedures for the subject.  A copy of the signed informed consent was given to the subject and a copy was placed in the subject's medical record.   [place] Chelsea Pedretti, Research Assistant 10/16/2019  13:00p.m.

## 2019-10-17 ENCOUNTER — Ambulatory Visit (HOSPITAL_COMMUNITY)
Admission: RE | Admit: 2019-10-17 | Discharge: 2019-10-17 | Disposition: A | Discharge status: Home / Self Care | Payer: BC Managed Care – PPO | Source: Ambulatory Visit | Attending: Cardiology | Admitting: Cardiology

## 2019-10-17 DIAGNOSIS — R079 Chest pain, unspecified: Secondary | ICD-10-CM | POA: Insufficient documentation

## 2019-10-19 ENCOUNTER — Telehealth: Payer: Self-pay

## 2019-10-19 MED ORDER — METOPROLOL SUCCINATE ER 25 MG PO TB24: 25.0000 mg | ORAL_TABLET | Freq: Every day | ORAL | 3 refills | Status: DC

## 2019-10-19 NOTE — Telephone Encounter (Signed)
-----   Message from Richardo Priest, MD sent at 10/17/2019  3:20 PM EDT ----- Normal or stable result  Abnormal but not severe.  I think you will do well with medication start Toprol-XL 25 mg daily and we can review at office follow-up.

## 2019-10-19 NOTE — Telephone Encounter (Signed)
Spoke with patient regarding results and recommendation.  Patient verbalizes understanding and is agreeable to plan of care. Advised patient to call back with any issues or concerns.  

## 2019-11-16 ENCOUNTER — Telehealth: Payer: Self-pay

## 2019-11-16 NOTE — Telephone Encounter (Signed)
Spoke with patient regarding results and recommendation.  Patient verbalizes understanding and is agreeable to plan of care. Advised patient to call back with any issues or concerns.  

## 2019-11-24 ENCOUNTER — Other Ambulatory Visit: Payer: Self-pay | Admitting: Internal Medicine

## 2019-12-14 ENCOUNTER — Ambulatory Visit: Payer: BC Managed Care – PPO | Admitting: Cardiology

## 2019-12-16 ENCOUNTER — Other Ambulatory Visit: Payer: Self-pay | Admitting: Internal Medicine

## 2020-01-06 DIAGNOSIS — C44629 Squamous cell carcinoma of skin of left upper limb, including shoulder: Secondary | ICD-10-CM | POA: Diagnosis not present

## 2020-01-12 ENCOUNTER — Telehealth: Payer: Self-pay | Admitting: Internal Medicine

## 2020-01-12 NOTE — Telephone Encounter (Signed)
Requesting: alprazolam 0.5mg  Contract: 04/19/2017 UDS: 09/22/2019 Moderate risk Last Visit: 09/07/2019 Next Visit: 01/13/2020  Last Refill: 10/06/2019 #60 and 2RF Pt sig: 1 tab bid  Please Advise

## 2020-01-12 NOTE — Telephone Encounter (Signed)
Last UDS showed no benzos, PDMP okay, RF sent, recheck UDS on RTC

## 2020-01-13 ENCOUNTER — Encounter: Payer: Self-pay | Admitting: Internal Medicine

## 2020-01-13 ENCOUNTER — Other Ambulatory Visit: Payer: Self-pay

## 2020-01-13 ENCOUNTER — Ambulatory Visit (INDEPENDENT_AMBULATORY_CARE_PROVIDER_SITE_OTHER): Payer: Medicare Other | Admitting: Internal Medicine

## 2020-01-13 VITALS — BP 132/89 | HR 84 | Temp 98.1°F | Resp 16 | Ht 69.0 in | Wt 193.5 lb

## 2020-01-13 DIAGNOSIS — E785 Hyperlipidemia, unspecified: Secondary | ICD-10-CM

## 2020-01-13 DIAGNOSIS — I10 Essential (primary) hypertension: Secondary | ICD-10-CM | POA: Diagnosis not present

## 2020-01-13 DIAGNOSIS — F411 Generalized anxiety disorder: Secondary | ICD-10-CM | POA: Diagnosis not present

## 2020-01-13 DIAGNOSIS — Z79899 Other long term (current) drug therapy: Secondary | ICD-10-CM | POA: Diagnosis not present

## 2020-01-13 NOTE — Patient Instructions (Addendum)
Happy belated Rudene Anda!  Check the  blood pressure regulalrly BP GOAL is between 110/65 and  135/85. If it is consistently higher or lower, let me know      [place], [place] back for blood work, fasting, next week    Come back for a physical exam by 04/2020

## 2020-01-13 NOTE — Progress Notes (Signed)
Pre visit review using our clinic review tool, if applicable. No additional management support is needed unless otherwise documented below in the visit note. 

## 2020-01-13 NOTE — Progress Notes (Signed)
Subjective:    Patient ID: Ryan Smith, male    DOB: 1956-01-22, 64 y.o.   MRN: [id number]  DOS:  01/13/2020 Type of visit - description: Follow-up Since the last office visit he is feeling well. Saw cardiology, notes reviewed. Ambulatory BPs in the 130, 140s/80s.    Review of Systems Denies chest pain. No difficulty breathing or lower extremity edema  Past Medical History:  Diagnosis Date  . Abnormal LFTs    2008 neg. hep. serology, (-) ANA, ceruloplasmin, etc.;  liver biopsy showed  minimal active inflammation  . Anxiety   . Barrett's esophagus   . Elevated PSA   . Esophageal reflux   . Hearing loss    has hearing aids  . Hiatal hernia   . History of diverticulitis of colon   . Personal history of colonic polyps    tublar adenomas 2008 & hyerplastic 2011  . PVD (peripheral vascular disease) ([place])   . SCC (squamous cell carcinoma)    L shoulder    Past Surgical History:  Procedure Laterality Date  . ABDOMINAL AORTAGRAM N/A 11/25/2013   Procedure: ABDOMINAL Maxcine Ham;  Surgeon: Wellington Hampshire, MD;  Location: [facility];  Service: Cardiovascular;  Laterality: N/A;  . COLONOSCOPY  02/07/2016  . Laparoscopically-assisted Sigmoid Colectomy  11/10/2007  . LIVER BIOPSY     minimal active inflammation  . UPPER GASTROINTESTINAL ENDOSCOPY  02/07/2016    Social History   Socioeconomic History  . Marital status: Married    Spouse name: Not on file  . Number of children: 2  . Years of education: Not on file  . Highest education level: Not on file  Occupational History  . 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 Smoking status: Former Smoker    Years: 40.00    Types: Cigarettes    Quit date: 02/26/2019    Years since quitting: 0.8  . Smokeless tobacco: Never Used  . Tobacco comment:    Vaping Use  . Vaping Use: Never used  Substance and Sexual Activity  . Alcohol use: Yes    Alcohol/week: 20.0  standard drinks    Types: 20 Glasses of wine per week    Comment: 1~ 2 glasses wine glasses daily  . Drug use: No  . Sexual activity: Not on file  Other Topics Concern  . Not on file  Social History Narrative   Born in [place]   Married, 2 step sons (adults)   Social Determinants of Health   Financial Resource Strain:   . Difficulty of Paying Living Expenses: Not on file  Food Insecurity:   . Worried About Charity fundraiser in the Last Year: Not on file  . Ran Out of Food in the Last Year: Not on file  Transportation Needs:   . Lack of Transportation (Medical): Not on file  . Lack of Transportation (Non-Medical): Not on file  Physical Activity:   . Days of Exercise per Week: Not on file  . Minutes of Exercise per Session: Not on file  Stress:   . Feeling of Stress : Not on file  Social Connections:   . Frequency of Communication with Friends and Family: Not on file  . Frequency of Social Gatherings with Friends and Family: Not on file  . Attends Religious Services: Not on file  . Active Member of Clubs or Organizations: Not on file  . Attends Archivist Meetings: Not  on file  . Marital Status: Not on file  Intimate Partner Violence:   . Fear of Current or Ex-Partner: Not on file  . Emotionally Abused: Not on file  . Physically Abused: Not on file  . Sexually Abused: Not on file      Allergies as of 01/13/2020   No Known Allergies     Medication List       Accurate as of January 13, 2020 11:59 PM. If you have any questions, ask your nurse or doctor.        ALPRAZolam 0.5 MG tablet Commonly known as: XANAX TAKE 1 TABLET BY MOUTH 2 TIMES DAILY.   aspirin EC 81 MG tablet Take 81 mg by mouth daily.   Coenzyme Q10 10 MG capsule Take 10 mg by mouth daily.   D-Limonene 1 g Caps Take by mouth.   Dexilant 60 MG capsule Generic drug: dexlansoprazole Take 1 capsule (60 mg total) by mouth daily.   famotidine 40 MG tablet Commonly known as:  PEPCID TAKE 1 TABLET BY MOUTH EVERYDAY AT BEDTIME   hydrochlorothiazide 25 MG tablet Commonly known as: HYDRODIURIL Take 1 tablet (25 mg total) by mouth daily.   losartan 25 MG tablet Commonly known as: COZAAR Take 1 tablet (25 mg total) by mouth daily.   Magnesium 500 MG Caps Take 1 capsule by mouth at bedtime.   metoprolol succinate 25 MG 24 hr tablet Commonly known as: Toprol XL Take 1 tablet (25 mg total) by mouth daily.   nitroGLYCERIN 0.4 MG SL tablet Commonly known as: NITROSTAT Place 1 tablet (0.4 mg total) under the tongue every 5 (five) minutes as needed for chest pain.   psyllium 58.6 % powder Commonly known as: METAMUCIL Take 1 packet by mouth at bedtime.   rosuvastatin 20 MG tablet Commonly known as: CRESTOR TAKE 1 TABLET BY MOUTH EVERY DAY          Objective:   Physical Exam BP 132/89 (BP Location: Left Arm, Patient Position: Sitting, Cuff Size: Normal)   Pulse 84   Temp 98.1 F (36.7 C) (Oral)   Resp 16   Ht 5\' 9"  (1.753 m)   Wt 193 lb 8 oz (87.8 kg)   SpO2 99%   BMI 28.57 kg/m  General:   Well developed, NAD, BMI noted. HEENT:  Normocephalic . Face symmetric, atraumatic Lungs:  CTA B Normal respiratory effort, no intercostal retractions, no accessory muscle use. Heart: RRR,  no murmur.  Lower extremities: no pretibial edema bilaterally  Skin: Not pale. Not jaundice Neurologic:  alert & oriented X3.  Speech normal, gait appropriate for age and unassisted Psych--  Cognition and judgment appear intact.  Cooperative with normal attention span and concentration.  Behavior appropriate. No anxious or depressed appearing.      Assessment     Assessment HTN: dx 07/2019 (intolerant amlodipine, carvedilol, see OV 09/07/2019) Anxiety Hyperlipidemia  PVD R leg, dx 2015,   claudication 10-2013, ABIs showed obstruction, saw [name], had a aortogram , was referred to surgery. They rx a CT chest show mild atherosclerosis disease of the aorta,  coronary arthrosclerosis. Had a MRI of the right leg, see report. Was recommended a bypass but sx  improved, did not proceed. No benefit from cilostazol Nonobstructive CAD -CT coronary morphology  10-2019:Mild to moderate, likely non-obstructive mixed CAD predominantly in the LAD,  GI:  --Barrett's, GERD, last EGD,BX : 02-2016  --Abnormal LFTs: 2008 neg. hep. serology, (-) ANA, ceruloplasmin, etc.;  liver bx:  minimal active inflammation Hearing loss, has aids  Sees dermatology q 6 ,months as off 12-2015 Stress 12-2013: Low risk Prostate BX 12-2016 (-)  PLAN: Nonobstructive CAD At the last OV c/o CP, saw cardiology, had a CT coronary study "Mild to moderate, likely non-obstructive mixed CAD predominantly in the LAD",  abnormal but not severe per cards, was recommended metoprolol and follow-up with cardiology. Discussed results with patient, plan is  CV RF's control. HTN: Ambulatory BPs 130, 140s/80s.  Currently on HCTZ, losartan, metoprolol, declined need to combine losartan &  HCT to decrease pill burden.  Check BMP continue monitoring BPs High cholesterol: On Crestor, last LDL 107, needs lower LDL, check a FLP see instructions.  Encourage healthy diet. GERD, Barrett's: Saw GI 09/23/2019, was recommended an EGD for Barrett esophagus surveillance 12-2021. Anxiety, on Xanax, check UDS. RTC labs next week RTC CPX 04-2020     This visit occurred during the SARS-CoV-2 public health emergency.  Safety protocols were in place, including screening questions prior to the visit, additional usage of staff PPE, and extensive cleaning of exam room while observing appropriate contact time as indicated for disinfecting solutions.

## 2020-01-14 NOTE — Assessment & Plan Note (Signed)
Nonobstructive CAD At the last OV c/o CP, saw cardiology, had a CT coronary study "Mild to moderate, likely non-obstructive mixed CAD predominantly in the LAD",  abnormal but not severe per cards, was recommended metoprolol and follow-up with cardiology. Discussed results with patient, plan is  CV RF's control. HTN: Ambulatory BPs 130, 140s/80s.  Currently on HCTZ, losartan, metoprolol, declined need to combine losartan &  HCT to decrease pill burden.  Check BMP continue monitoring BPs High cholesterol: On Crestor, last LDL 107, needs lower LDL, check a FLP see instructions.  Encourage healthy diet. GERD, Barrett's: Saw GI 09/23/2019, was recommended an EGD for Barrett esophagus surveillance 12-2021. Anxiety, on Xanax, check UDS. RTC labs next week RTC CPX 04-2020

## 2020-01-20 ENCOUNTER — Other Ambulatory Visit: Payer: Medicare Other

## 2020-01-21 ENCOUNTER — Other Ambulatory Visit: Payer: Self-pay

## 2020-01-21 DIAGNOSIS — K449 Diaphragmatic hernia without obstruction or gangrene: Secondary | ICD-10-CM | POA: Insufficient documentation

## 2020-01-21 DIAGNOSIS — K219 Gastro-esophageal reflux disease without esophagitis: Secondary | ICD-10-CM | POA: Insufficient documentation

## 2020-01-21 DIAGNOSIS — F419 Anxiety disorder, unspecified: Secondary | ICD-10-CM | POA: Insufficient documentation

## 2020-01-21 DIAGNOSIS — I739 Peripheral vascular disease, unspecified: Secondary | ICD-10-CM | POA: Insufficient documentation

## 2020-01-21 DIAGNOSIS — R7989 Other specified abnormal findings of blood chemistry: Secondary | ICD-10-CM | POA: Insufficient documentation

## 2020-01-21 DIAGNOSIS — Z860100 Personal history of colon polyps, unspecified: Secondary | ICD-10-CM | POA: Insufficient documentation

## 2020-01-21 DIAGNOSIS — Z8719 Personal history of other diseases of the digestive system: Secondary | ICD-10-CM | POA: Insufficient documentation

## 2020-01-21 DIAGNOSIS — R945 Abnormal results of liver function studies: Secondary | ICD-10-CM | POA: Insufficient documentation

## 2020-01-21 NOTE — Progress Notes (Signed)
Cardiology Office Note:    Date:  01/22/2020   ID:  Ryan Smith, DOB 1955-04-08, MRN [id number]  PCP:  Colon Branch, MD  Cardiologist:  Shirlee More, MD    Referring MD: Colon Branch, MD    ASSESSMENT:    1. Mild CAD   2. Agatston coronary artery calcium score between 200 and 399   3. Hypertension, essential   4. Hyperlipidemia, unspecified hyperlipidemia type   5. PVD (peripheral vascular disease) with claudication (HCC)    PLAN:    In order of problems listed above:  1. He is having no angina continue treatment aspirin antihypertensives and intensify lipid-lowering and Zetia to rosuvastatin with follow-up labs with his PCP.  Goal LDL less than 70 ideally less than 55 if not tolerated or ineffective PCSK9 inhibitor is appropriate 2. BP at target continue current therapy 3. Not having claudication. 4. Male greater than 65 on Medicare previous smoker 40 pack years check screening duplex abdominal aorta for aneurysm   Next appointment: As needed.  He will follow up with his primary care physician   Medication Adjustments/Labs and Tests Ordered: Current medicines are reviewed at length with the patient today.  Concerns regarding medicines are outlined above.  No orders of the defined types were placed in this encounter.  No orders of the defined types were placed in this encounter.   No chief complaint on file.   History of Present Illness:    Ryan Smith is a 64 y.o. male with a hx of hypertension hyperlipidemia and PAD with right lower extremity popliteal entrapment syndrome last seen 09/21/2019.  Compliance with diet, lifestyle and medications: Yes  Cardiac CTA 10/16/2019 showed elevated calcium score 206 which was 70th percentile for age and sex and mild to moderate nonobstructive CAD predominantly left anterior descending coronary artery with normal FFR.  He has good healthcare literacy I reviewed that his LDL remains above 100 and high intensity statin we  discussed ways to achieve goal less than 55 and we will add Zetia to his medical regimen and he has upcoming wellness exam in the next few months with his primary care physician.  His LDL remains significantly elevated PCSK9 inhibitor is an option.  He has no angina dyspnea or palpitation.  He has smoked for 40 pack years entered Medicare and should have a screening duplex abdominal aortic for aneurysm ordered Past Medical History:  Diagnosis Date  . Abnormal LFTs    2008 neg. hep. serology, (-) ANA, ceruloplasmin, etc.;  liver biopsy showed  minimal active inflammation  . Abnormal LFTs (liver function tests) 08/15/2010   increased LFTs: 2008 neg. hep. serology, (-) ANA, ceruloplasmin, etc.; liver biopsy showed minimal active inflammation    . Anxiety   . Anxiety state 08/30/2006   Qualifier: Diagnosis of  By: Carley Hammed     . Atherosclerosis of native arteries of the extremities with intermittent claudication 11/30/2013  . Barrett's esophagus   . Cerumen impaction 03/03/2014  . Claudication ([place]) 11/18/2013  . COMMON MIGRAINE 08/30/2006   Qualifier: Diagnosis of  By: Carley Hammed    . Diverticulosis 08/15/2010  . Elevated PSA   . Esophageal reflux   . GERD (gastroesophageal reflux disease) 08/15/2010  . Hearing loss    has hearing aids  . Hiatal hernia   . HIATAL HERNIA WITH REFLUX 12/21/2009   Qualifier: Diagnosis of  By: Nils Pyle CMA ([place]), Mearl Latin    . History of diverticulitis of colon   . Hx  of adenomatous colonic polyps 08/15/2010  . Hyperlipidemia 10/03/2011  . Hypertension, essential 07/22/2019  . INSOMNIA 08/30/2006   Qualifier: Diagnosis of  By: Carley Hammed    . LOSS, HEARING NOS 08/30/2006   Qualifier: Diagnosis of  By: Carley Hammed    . Other specified disorders of liver 11/15/2009   Qualifier: Diagnosis of  Problem Stop Reason:  By: Sharlett Iles MD Byrd Hesselbach Personal history of colonic polyps    tublar adenomas 2008 & hyerplastic 2011  . Preop cardiovascular  exam 01/19/2014  . PVD (peripheral vascular disease) ([place])   . PVD (peripheral vascular disease) with claudication ([place]) 01/04/2014  . SCC (squamous cell carcinoma)    L shoulder  . Tobacco use 11/24/2013    Past Surgical History:  Procedure Laterality Date  . ABDOMINAL AORTAGRAM N/A 11/25/2013   Procedure: ABDOMINAL Maxcine Ham;  Surgeon: Wellington Hampshire, MD;  Location: [facility]ATH [facility];  Service: Cardiovascular;  Laterality: N/A;  . COLONOSCOPY  02/07/2016  . Laparoscopically-assisted Sigmoid Colectomy  11/10/2007  . LIVER BIOPSY     minimal active inflammation  . UPPER GASTROINTESTINAL ENDOSCOPY  02/07/2016    Current Medications: Current Meds  Medication Sig  . ALPRAZolam (XANAX) 0.5 MG tablet Take 0.5 mg by mouth as needed for anxiety.  Marland Kitchen aspirin EC 81 MG tablet Take 81 mg by mouth daily.  . Coenzyme Q10 10 MG capsule Take 10 mg by mouth daily.  Marland Kitchen dexlansoprazole (DEXILANT) 60 MG capsule Take 1 capsule (60 mg total) by mouth daily.  . hydrochlorothiazide (HYDRODIURIL) 25 MG tablet Take 1 tablet (25 mg total) by mouth daily.  Marland Kitchen losartan (COZAAR) 25 MG tablet Take 1 tablet (25 mg total) by mouth daily.  . Magnesium 500 MG CAPS Take 1 capsule by mouth at bedtime.    . metoprolol succinate (TOPROL XL) 25 MG 24 hr tablet Take 1 tablet (25 mg total) by mouth daily.  [place] Pane (D-LIMONENE) 1 g CAPS Take 1 g by mouth as needed.   . psyllium (METAMUCIL) 58.6 % powder Take 1 packet by mouth at bedtime.   . rosuvastatin (CRESTOR) 20 MG tablet TAKE 1 TABLET BY MOUTH EVERY DAY     Allergies:   Patient has no known allergies.   Social History   Socioeconomic History  . Marital status: Married    Spouse name: Not on file  . Number of children: 2  . Years of education: Not on file  . Highest education level: Not on file  Occupational History  . 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 Smoking status: Former Smoker    Years: 40.00     Types: Cigarettes    Quit date: 02/26/2019    Years since quitting: 0.9  . Smokeless tobacco: Never Used  . Tobacco comment:    Vaping Use  . Vaping Use: Never used  Substance and Sexual Activity  . Alcohol use: Yes    Alcohol/week: 20.0 standard drinks    Types: 20 Glasses of wine per week    Comment: 1~ 2 glasses wine glasses daily  . Drug use: No  . Sexual activity: Not on file  Other Topics Concern  . Not on file  Social History Narrative   Born in [place]   Married, 2 step sons (adults)   Social Determinants of Health   Financial Resource Strain:   . Difficulty of Paying Living Expenses: Not on file  Food Insecurity:   . Worried About Charity fundraiser in the Last Year: Not on file  . Ran Out of Food in the Last Year: Not on file  Transportation Needs:   . Lack of Transportation (Medical): Not on file  . Lack of Transportation (Non-Medical): Not on file  Physical Activity:   . Days of Exercise per Week: Not on file  . Minutes of Exercise per Session: Not on file  Stress:   . Feeling of Stress : Not on file  Social Connections:   . Frequency of Communication with Friends and Family: Not on file  . Frequency of Social Gatherings with Friends and Family: Not on file  . Attends Religious Services: Not on file  . Active Member of Clubs or Organizations: Not on file  . Attends Archivist Meetings: Not on file  . Marital Status: Not on file     Family History: The patient's family history includes Coronary artery disease in his father; Diverticulitis in his mother; Esophageal cancer in his father; Heart disease in his father and paternal uncle; Prostate cancer (age of onset: 76) in his father. There is no history of Colon cancer, Diabetes, Rectal cancer, Stomach cancer, or Colon polyps. ROS:   Please see the history of present illness.    All other systems reviewed and are negative.  EKGs/Labs/Other Studies Reviewed:    The following studies  were reviewed today:   Recent Labs: 03/03/2019: Hemoglobin 16.7; Platelets 358.0 09/22/2019: ALT 48; BUN 11; Creatinine, Ser 0.97; Potassium 5.1; Sodium 139  Recent Lipid Panel    Component Value Date/Time   CHOL 183 04/10/2019 1028   TRIG 156.0 (H) 04/10/2019 1028   HDL 44.70 04/10/2019 1028   CHOLHDL 4 04/10/2019 1028   VLDL 31.2 04/10/2019 1028   LDLCALC 107 (H) 04/10/2019 1028   LDLDIRECT 145.2 04/10/2011 0936    Physical Exam:    VS:  BP 131/74   Pulse 68   Ht 5\' 9"  (1.753 m)   Wt 191 lb 1.9 oz (86.7 kg)   SpO2 97%   BMI 28.22 kg/m     Wt Readings from Last 3 Encounters:  01/22/20 191 lb 1.9 oz (86.7 kg)  01/13/20 193 lb 8 oz (87.8 kg)  09/23/19 187 lb (84.8 kg)     GEN:  Well nourished, well developed in no acute distress HEENT: Normal NECK: No JVD; No carotid bruits LYMPHATICS: No lymphadenopathy CARDIAC: RRR, no murmurs, rubs, gallops RESPIRATORY:  Clear to auscultation without rales, wheezing or rhonchi  ABDOMEN: Soft, non-tender, non-distended MUSCULOSKELETAL:  No edema; No deformity  SKIN: Warm and dry NEUROLOGIC:  Alert and oriented x 3 PSYCHIATRIC:  Normal affect    Signed, Shirlee More, MD  01/22/2020 1:35 PM    [facility]

## 2020-01-22 ENCOUNTER — Other Ambulatory Visit: Payer: Self-pay

## 2020-01-22 ENCOUNTER — Ambulatory Visit: Payer: Medicare Other | Admitting: Cardiology

## 2020-01-22 ENCOUNTER — Encounter: Payer: Self-pay | Admitting: Cardiology

## 2020-01-22 VITALS — BP 131/74 | HR 68 | Ht 69.0 in | Wt 191.1 lb

## 2020-01-22 DIAGNOSIS — R931 Abnormal findings on diagnostic imaging of heart and coronary circulation: Secondary | ICD-10-CM

## 2020-01-22 DIAGNOSIS — I10 Essential (primary) hypertension: Secondary | ICD-10-CM

## 2020-01-22 DIAGNOSIS — Z136 Encounter for screening for cardiovascular disorders: Secondary | ICD-10-CM

## 2020-01-22 DIAGNOSIS — I2510 Atherosclerotic heart disease of native coronary artery without angina pectoris: Secondary | ICD-10-CM | POA: Diagnosis not present

## 2020-01-22 DIAGNOSIS — E785 Hyperlipidemia, unspecified: Secondary | ICD-10-CM

## 2020-01-22 DIAGNOSIS — I739 Peripheral vascular disease, unspecified: Secondary | ICD-10-CM

## 2020-01-22 MED ORDER — EZETIMIBE 10 MG PO TABS
10.0000 mg | ORAL_TABLET | Freq: Every day | ORAL | 3 refills | Status: DC
Start: 2020-01-22 — End: 2021-01-11

## 2020-01-22 NOTE — Patient Instructions (Signed)
Medication Instructions:  Your physician has recommended you make the following change in your medication:  START: Zetia 10 mg take one tablet by mouth daily.  *If you need a refill on your cardiac medications before your next appointment, please call your pharmacy*   Lab Work: None If you have labs (blood work) drawn today and your tests are completely normal, you will receive your results only by: Marland Kitchen MyChart Message (if you have MyChart) OR . A paper copy in the mail If you have any lab test that is abnormal or we need to change your treatment, we will call you to review the results.   Testing/Procedures: Your physician has requested that you have an abdominal aorta duplex. During this test, an ultrasound is used to evaluate the aorta. Allow 30 minutes for this exam. Do not eat after midnight the day before and avoid carbonated beverages   Follow-Up: At [facility], you and your health needs are our priority.  As part of our continuing mission to provide you with exceptional heart care, we have created designated Provider Care Teams.  These Care Teams include your primary Cardiologist (physician) and Advanced Practice Providers (APPs -  Physician Assistants and Nurse Practitioners) who all work together to provide you with the care you need, when you need it.  We recommend signing up for the patient portal called "MyChart".  Sign up information is provided on this After Visit Summary.  MyChart is used to connect with patients for Virtual Visits (Telemedicine).  Patients are able to view lab/test results, encounter notes, upcoming appointments, etc.  Non-urgent messages can be sent to your provider as well.   To learn more about what you can do with MyChart, go to [email or web address].    Your next appointment:   As needed  The format for your next appointment:   In Person  Provider:   Shirlee More, MD   Other Instructions

## 2020-02-12 ENCOUNTER — Other Ambulatory Visit: Payer: Self-pay | Admitting: Internal Medicine

## 2020-02-12 NOTE — Telephone Encounter (Signed)
Requesting: alprazolam 0.5mg  Contract: 04/19/2017 UDS: 09/22/2019 Last Visit: 01/13/2020 Next Visit: 04/13/2020 Last Refill: 01/12/2020 #60 and 0RF   Please Advise

## 2020-02-29 ENCOUNTER — Ambulatory Visit (HOSPITAL_BASED_OUTPATIENT_CLINIC_OR_DEPARTMENT_OTHER)
Admission: RE | Admit: 2020-02-29 | Discharge: 2020-02-29 | Disposition: A | Discharge status: Home / Self Care | Payer: Medicare Other | Source: Ambulatory Visit | Attending: Cardiology | Admitting: Cardiology

## 2020-02-29 ENCOUNTER — Telehealth: Payer: Self-pay

## 2020-02-29 ENCOUNTER — Other Ambulatory Visit: Payer: Self-pay

## 2020-02-29 DIAGNOSIS — Z87891 Personal history of nicotine dependence: Secondary | ICD-10-CM | POA: Diagnosis not present

## 2020-02-29 DIAGNOSIS — Z136 Encounter for screening for cardiovascular disorders: Secondary | ICD-10-CM | POA: Insufficient documentation

## 2020-02-29 NOTE — Telephone Encounter (Signed)
Spoke with patient regarding results and recommendation.  Patient verbalizes understanding and is agreeable to plan of care. Advised patient to call back with any issues or concerns.  

## 2020-02-29 NOTE — Telephone Encounter (Signed)
-----   Message from Richardo Priest, MD sent at 02/29/2020 12:24 PM EST ----- Good result no aneurysm

## 2020-03-14 ENCOUNTER — Telehealth: Payer: Self-pay | Admitting: Family Medicine

## 2020-03-14 NOTE — Telephone Encounter (Signed)
PDMP okay, Rx sent, will get a UDS at the next opportunity.

## 2020-03-14 NOTE — Telephone Encounter (Signed)
Requesting: alprazolam 0.5mg  Contract: 04/19/2017 UDS: 09/22/2019 Moderate risk Last Visit: 01/13/2020 Next Visit: 04/13/2020 Last Refill: 02/12/2020 #60 and 0RF  Please Advise

## 2020-03-15 ENCOUNTER — Other Ambulatory Visit: Payer: Self-pay | Admitting: Internal Medicine

## 2020-03-30 ENCOUNTER — Other Ambulatory Visit: Payer: Self-pay | Admitting: Internal Medicine

## 2020-04-13 ENCOUNTER — Encounter: Payer: Self-pay | Admitting: Internal Medicine

## 2020-04-13 ENCOUNTER — Ambulatory Visit (INDEPENDENT_AMBULATORY_CARE_PROVIDER_SITE_OTHER): Payer: Medicare Other | Admitting: Internal Medicine

## 2020-04-13 ENCOUNTER — Other Ambulatory Visit: Payer: Self-pay

## 2020-04-13 VITALS — BP 137/85 | HR 65 | Temp 98.6°F | Resp 16 | Ht 69.0 in | Wt 192.4 lb

## 2020-04-13 DIAGNOSIS — Z79899 Other long term (current) drug therapy: Secondary | ICD-10-CM

## 2020-04-13 DIAGNOSIS — Z0000 Encounter for general adult medical examination without abnormal findings: Secondary | ICD-10-CM

## 2020-04-13 DIAGNOSIS — E785 Hyperlipidemia, unspecified: Secondary | ICD-10-CM

## 2020-04-13 DIAGNOSIS — I10 Essential (primary) hypertension: Secondary | ICD-10-CM

## 2020-04-13 DIAGNOSIS — R972 Elevated prostate specific antigen [PSA]: Secondary | ICD-10-CM

## 2020-04-13 DIAGNOSIS — G4700 Insomnia, unspecified: Secondary | ICD-10-CM

## 2020-04-13 DIAGNOSIS — F411 Generalized anxiety disorder: Secondary | ICD-10-CM

## 2020-04-13 LAB — LIPID PANEL
Cholesterol: 137 mg/dL (ref 0–200)
HDL: 49 mg/dL (ref >39.00)
LDL Cholesterol: 60 mg/dL (ref 0–99)
NonHDL: 87.68
Total CHOL/HDL Ratio: 3
Triglycerides: 140 mg/dL (ref 0.0–149.0)
VLDL: 28 mg/dL (ref 0.0–40.0)

## 2020-04-13 LAB — COMPREHENSIVE METABOLIC PANEL
ALT: 64 U/L — ABNORMAL HIGH (ref 0–53)
AST: 44 U/L — ABNORMAL HIGH (ref 0–37)
Albumin: 4.8 g/dL (ref 3.5–5.2)
Alkaline Phosphatase: 75 U/L (ref 39–117)
BUN: 10 mg/dL (ref 6–23)
CO2: 32 mEq/L (ref 19–32)
Calcium: 9.9 mg/dL (ref 8.4–10.5)
Chloride: 101 mEq/L (ref 96–112)
Creatinine, Ser: 0.94 mg/dL (ref 0.40–1.50)
GFR: 85.22 mL/min (ref >60.00)
Glucose, Bld: 102 mg/dL — ABNORMAL HIGH (ref 70–99)
Potassium: 4.5 mEq/L (ref 3.5–5.1)
Sodium: 140 mEq/L (ref 135–145)
Total Bilirubin: 0.7 mg/dL (ref 0.2–1.2)
Total Protein: 7.3 g/dL (ref 6.0–8.3)

## 2020-04-13 LAB — CBC WITH DIFFERENTIAL/PLATELET
Basophils Absolute: 0 10*3/uL (ref 0.0–0.1)
Basophils Relative: 0.6 % (ref 0.0–3.0)
Eosinophils Absolute: 0.1 10*3/uL (ref 0.0–0.7)
Eosinophils Relative: 1.9 % (ref 0.0–5.0)
HCT: 49.4 % (ref 39.0–52.0)
Hemoglobin: 16.5 g/dL (ref 13.0–17.0)
Lymphocytes Relative: 30.8 % (ref 12.0–46.0)
Lymphs Abs: 2.3 10*3/uL (ref 0.7–4.0)
MCHC: 33.4 g/dL (ref 30.0–36.0)
MCV: 92.6 fl (ref 78.0–100.0)
Monocytes Absolute: 0.7 10*3/uL (ref 0.1–1.0)
Monocytes Relative: 9.5 % (ref 3.0–12.0)
Neutro Abs: 4.2 10*3/uL (ref 1.4–7.7)
Neutrophils Relative %: 57.2 % (ref 43.0–77.0)
Platelets: 377 10*3/uL (ref 150.0–400.0)
RBC: 5.33 Mil/uL (ref 4.22–5.81)
RDW: 13 % (ref 11.5–15.5)
WBC: 7.4 10*3/uL (ref 4.0–10.5)

## 2020-04-13 LAB — TSH: TSH: 2.16 u[IU]/mL (ref 0.35–4.50)

## 2020-04-13 LAB — PSA: PSA: 4.31 ng/mL — ABNORMAL HIGH (ref 0.10–4.00)

## 2020-04-13 NOTE — Patient Instructions (Signed)
Check the  blood pressure  BP GOAL is between 110/65 and  135/85. If it is consistently higher or lower, let me know     GO TO THE LAB : Get the blood work     GO TO THE FRONT DESK, PLEASE SCHEDULE YOUR APPOINTMENTS Come back for a checkup in 6 months 

## 2020-04-13 NOTE — Progress Notes (Signed)
Pre visit review using our clinic review tool, if applicable. No additional management support is needed unless otherwise documented below in the visit note. 

## 2020-04-13 NOTE — Progress Notes (Signed)
Subjective:    Patient ID: Ryan Smith, male    DOB: [birth year], 65 y.o.   MRN: [id number]  DOS:  04/13/2020 Type of visit - description: CPX No major concerns.  Review of Systems   A 14 point review of systems is negative    Past Medical History:  Diagnosis Date  . Abnormal LFTs    2008 neg. hep. serology, (-) ANA, ceruloplasmin, etc.;  liver biopsy showed  minimal active inflammation  . Abnormal LFTs (liver function tests) 08/15/2010   increased LFTs: 2008 neg. hep. serology, (-) ANA, ceruloplasmin, etc.; liver biopsy showed minimal active inflammation    . Anxiety   . Anxiety state 08/30/2006   Qualifier: Diagnosis of  By: Carley Hammed     . Atherosclerosis of native arteries of the extremities with intermittent claudication 11/30/2013  . Barrett's esophagus   . Cerumen impaction 03/03/2014  . Claudication ([place]) 11/18/2013  . COMMON MIGRAINE 08/30/2006   Qualifier: Diagnosis of  By: Carley Hammed    . Diverticulosis 08/15/2010  . Elevated PSA   . Esophageal reflux   . GERD (gastroesophageal reflux disease) 08/15/2010  . Hearing loss    has hearing aids  . Hiatal hernia   . HIATAL HERNIA WITH REFLUX 12/21/2009   Qualifier: Diagnosis of  By: Nils Pyle CMA ([place]), Mearl Latin    . History of diverticulitis of colon   . Hx of adenomatous colonic polyps 08/15/2010  . Hyperlipidemia 10/03/2011  . Hypertension, essential 07/22/2019  . INSOMNIA 08/30/2006   Qualifier: Diagnosis of  By: Carley Hammed    . LOSS, HEARING NOS 08/30/2006   Qualifier: Diagnosis of  By: Carley Hammed    . Other specified disorders of liver 11/15/2009   Qualifier: Diagnosis of  Problem Stop Reason:  By: Sharlett Iles MD Byrd Hesselbach Personal history of colonic polyps    tublar adenomas 2008 & hyerplastic 2011  . Preop cardiovascular exam 01/19/2014  . PVD (peripheral vascular disease) ([place])   . PVD (peripheral vascular disease) with claudication ([place]) 01/04/2014  . SCC (squamous cell carcinoma)    L  shoulder  . Tobacco use 11/24/2013    Past Surgical History:  Procedure Laterality Date  . ABDOMINAL AORTAGRAM N/A 11/25/2013   Procedure: ABDOMINAL Maxcine Ham;  Surgeon: Wellington Hampshire, MD;  Location: [facility] CATH [facility];  Service: Cardiovascular;  Laterality: N/A;  . COLONOSCOPY  02/07/2016  . Laparoscopically-assisted Sigmoid Colectomy  11/10/2007  . LIVER BIOPSY     minimal active inflammation  . UPPER GASTROINTESTINAL ENDOSCOPY  02/07/2016    Allergies as of 04/13/2020   No Known Allergies     Medication List       Accurate as of April 13, 2020 11:59 PM. If you have any questions, ask your nurse or doctor.        ALPRAZolam 0.5 MG tablet Commonly known as: XANAX TAKE 1 TABLET BY MOUTH TWICE A DAY   aspirin EC 81 MG tablet Take 81 mg by mouth daily.   Coenzyme Q10 10 MG capsule Take 10 mg by mouth daily.   D-Limonene 1 g Caps Take 1 g by mouth as needed.   Dexilant 60 MG capsule Generic drug: dexlansoprazole Take 1 capsule (60 mg total) by mouth daily.   ezetimibe 10 MG tablet Commonly known as: ZETIA Take 1 tablet (10 mg total) by mouth daily.   hydrochlorothiazide 25 MG tablet Commonly known as: HYDRODIURIL Take 1 tablet (25 mg total) by mouth daily.   losartan  25 MG tablet Commonly known as: COZAAR Take 1 tablet (25 mg total) by mouth daily.   Magnesium 500 MG Caps Take 1 capsule by mouth at bedtime.   metoprolol succinate 25 MG 24 hr tablet Commonly known as: Toprol XL Take 1 tablet (25 mg total) by mouth daily.   nitroGLYCERIN 0.4 MG SL tablet Commonly known as: NITROSTAT Place 1 tablet (0.4 mg total) under the tongue every 5 (five) minutes as needed for chest pain.   psyllium 58.6 % powder Commonly known as: METAMUCIL Take 1 packet by mouth at bedtime.   rosuvastatin 20 MG tablet Commonly known as: CRESTOR Take 1 tablet (20 mg total) by mouth daily.          Objective:   Physical Exam BP 137/85 (BP Location: Left Arm, Patient Position:  Sitting, Cuff Size: Normal)   Pulse 65   Temp 98.6 F (37 C) (Oral)   Resp 16   Ht 5\' 9"  (1.753 m)   Wt 192 lb 6 oz (87.3 kg)   SpO2 98%   BMI 28.41 kg/m  General: Well developed, NAD, BMI noted Neck: No  thyromegaly  HEENT:  Normocephalic . Face symmetric, atraumatic Lungs:  CTA B Normal respiratory effort, no intercostal retractions, no accessory muscle use. Heart: RRR,  no murmur.  Abdomen:  Not distended, soft, non-tender. No rebound or rigidity.  Reducible umbilical hernia Lower extremities: no pretibial edema bilaterally DRE: Declined  Skin: Exposed areas without rash. Not pale. Not jaundice Neurologic:  alert & oriented X3.  Speech normal, gait appropriate for age and unassisted Strength symmetric and appropriate for age.  Psych: Cognition and judgment appear intact.  Cooperative with normal attention span and concentration.  Behavior appropriate. No anxious or depressed appearing.     Assessment    Assessment HTN: dx 07/2019 (intolerant amlodipine, carvedilol, see OV 09/07/2019) Anxiety Hyperlipidemia  PVD R leg, dx 2015,   claudication 10-2013, ABIs showed obstruction, saw [name], had a aortogram , was referred to surgery. They rx a CT chest show mild atherosclerosis disease of the aorta, coronary arthrosclerosis. Had a MRI of the right leg, see report. Was recommended a bypass but sx  improved, did not proceed. No benefit from cilostazol Nonobstructive CAD -CT coronary morphology  10-2019:Mild to moderate, likely non-obstructive mixed CAD predominantly in the LAD,  GI:  --Barrett's, GERD, last EGD,BX : 02-2016  --Abnormal LFTs: 2008 neg. hep. serology, (-) ANA, ceruloplasmin, etc.;  liver bx:  minimal active inflammation Hearing loss, has aids  Sees dermatology q 6 ,months as off 12-2015 Stress 12-2013: Low risk Prostate BX 12-2016 (-)  PLAN: Here for CPX HTN, seems well controlled, checking labs, monitor home recommended, continue HCTZ, losartan,  metoprolol. Anxiety: On Xanax, UDS and contract today, some stress related to taking care of his mother. High cholesterol: On Crestor, recently Zetia was added by cardiology, checking labs RTC 6 months     This visit occurred during the SARS-CoV-2 public health emergency.  Safety protocols were in place, including screening questions prior to the visit, additional usage of staff PPE, and extensive cleaning of exam room while observing appropriate contact time as indicated for disinfecting solutions.

## 2020-04-14 ENCOUNTER — Encounter: Payer: Self-pay | Admitting: Internal Medicine

## 2020-04-14 NOTE — Assessment & Plan Note (Signed)
Here for CPX HTN, seems well controlled, checking labs, monitor home recommended, continue HCTZ, losartan, metoprolol. Anxiety: On Xanax, UDS and contract today, some stress related to taking care of his mother. High cholesterol: On Crestor, recently Zetia was added by cardiology, checking labs RTC 6 months

## 2020-04-14 NOTE — Assessment & Plan Note (Signed)
--  Td 05-2016  - pnm 23 2016; prevnar: declined today ("too many shots lately") - shingrix: declined today - had covid shots x 3 - had a flu shot  --CCS: last colonoscopy 10/2009, polyp, cscope 02-2016: + Polyps, 5 years. --Prostate cancer screening:12-2016 prostate bx (-)DRE normal 2020, PSA was stable.  Declined DRE today, check a PSA. Plans to see urology "if really needed"  --Exercise-diet: Discussed --Tobacco: Quit 02-2019, praised  --Lung cancer screening: Smoke 0.25 PPD for 40 years, officially does not qualify under Medicare guidelines.  Does have the option to proceed and pay out-of-pocket. -- AAA  Screening (-) 02/2020 --Labs:  CMP, FLP, CBC, TSH, PSA,

## 2020-04-16 LAB — DRUG MONITORING, PANEL 8 WITH CONFIRMATION, URINE
6 Acetylmorphine: NEGATIVE ng/mL (ref <10)
Alcohol Metabolites: POSITIVE ng/mL — AB
Alphahydroxyalprazolam: 91 ng/mL — ABNORMAL HIGH (ref <25)
Alphahydroxymidazolam: NEGATIVE ng/mL (ref <50)
Alphahydroxytriazolam: NEGATIVE ng/mL (ref <50)
Aminoclonazepam: NEGATIVE ng/mL (ref <25)
Amphetamines: NEGATIVE ng/mL (ref <500)
Benzodiazepines: POSITIVE ng/mL — AB (ref <100)
Buprenorphine, Urine: NEGATIVE ng/mL (ref <5)
Cocaine Metabolite: NEGATIVE ng/mL (ref <150)
Creatinine: 60 mg/dL
Ethyl Glucuronide (ETG): 775 ng/mL — ABNORMAL HIGH (ref <500)
Ethyl Sulfate (ETS): 375 ng/mL — ABNORMAL HIGH (ref <100)
Hydroxyethylflurazepam: NEGATIVE ng/mL (ref <50)
Lorazepam: NEGATIVE ng/mL (ref <50)
MDMA: NEGATIVE ng/mL (ref <500)
Marijuana Metabolite: NEGATIVE ng/mL (ref <20)
Nordiazepam: NEGATIVE ng/mL (ref <50)
Opiates: NEGATIVE ng/mL (ref <100)
Oxazepam: NEGATIVE ng/mL (ref <50)
Oxidant: NEGATIVE ug/mL
Oxycodone: NEGATIVE ng/mL (ref <100)
Temazepam: NEGATIVE ng/mL (ref <50)
pH: 7.6 (ref 4.5–9.0)

## 2020-04-16 LAB — DM TEMPLATE

## 2020-04-24 ENCOUNTER — Other Ambulatory Visit: Payer: Self-pay | Admitting: Internal Medicine

## 2020-04-25 MED ORDER — HYDROCHLOROTHIAZIDE 25 MG PO TABS: 25.0000 mg | ORAL_TABLET | Freq: Every day | ORAL | 1 refills | Status: DC

## 2020-04-25 NOTE — Addendum Note (Signed)
Addended byDamita Dunnings D on: 04/25/2020 11:39 AM   Modules accepted: Orders

## 2020-04-25 NOTE — Telephone Encounter (Signed)
E-scribe down. Rx printed and faxed to [facility] .

## 2020-05-20 ENCOUNTER — Other Ambulatory Visit: Payer: Self-pay | Admitting: Internal Medicine

## 2020-05-20 NOTE — Telephone Encounter (Signed)
Requesting: alprazolam 0.5mg  Contract:04/13/2020 UDS: 04/13/2020 Last Visit: 04/13/2020 Next Visit: None Last Refill: 03/14/2020 #60 and 1RF  Please Advise

## 2020-05-20 NOTE — Telephone Encounter (Signed)
PDMP okay, RF sent 

## 2020-06-14 ENCOUNTER — Encounter: Payer: Self-pay | Admitting: Internal Medicine

## 2020-06-29 ENCOUNTER — Other Ambulatory Visit: Payer: Self-pay | Admitting: Internal Medicine

## 2020-07-12 DIAGNOSIS — D225 Melanocytic nevi of trunk: Secondary | ICD-10-CM | POA: Diagnosis not present

## 2020-07-12 DIAGNOSIS — L821 Other seborrheic keratosis: Secondary | ICD-10-CM | POA: Diagnosis not present

## 2020-07-12 DIAGNOSIS — D485 Neoplasm of uncertain behavior of skin: Secondary | ICD-10-CM | POA: Diagnosis not present

## 2020-07-12 DIAGNOSIS — L905 Scar conditions and fibrosis of skin: Secondary | ICD-10-CM | POA: Diagnosis not present

## 2020-07-12 DIAGNOSIS — L718 Other rosacea: Secondary | ICD-10-CM | POA: Diagnosis not present

## 2020-07-12 DIAGNOSIS — Z85828 Personal history of other malignant neoplasm of skin: Secondary | ICD-10-CM | POA: Diagnosis not present

## 2020-07-12 DIAGNOSIS — L570 Actinic keratosis: Secondary | ICD-10-CM | POA: Diagnosis not present

## 2020-07-12 DIAGNOSIS — C44719 Basal cell carcinoma of skin of left lower limb, including hip: Secondary | ICD-10-CM | POA: Diagnosis not present

## 2020-07-20 ENCOUNTER — Other Ambulatory Visit: Payer: Self-pay | Admitting: Internal Medicine

## 2020-07-21 DIAGNOSIS — C4491 Basal cell carcinoma of skin, unspecified: Secondary | ICD-10-CM | POA: Insufficient documentation

## 2020-08-01 ENCOUNTER — Other Ambulatory Visit: Payer: Self-pay | Admitting: Gastroenterology

## 2020-10-08 ENCOUNTER — Other Ambulatory Visit: Payer: Self-pay | Admitting: Internal Medicine

## 2020-10-13 ENCOUNTER — Other Ambulatory Visit: Payer: Self-pay | Admitting: Cardiology

## 2020-10-19 ENCOUNTER — Ambulatory Visit: Payer: Medicare Other | Admitting: Internal Medicine

## 2020-10-26 ENCOUNTER — Other Ambulatory Visit: Payer: Self-pay | Admitting: Gastroenterology

## 2020-11-08 ENCOUNTER — Other Ambulatory Visit: Payer: Self-pay | Admitting: Internal Medicine

## 2020-11-21 ENCOUNTER — Other Ambulatory Visit: Payer: Self-pay | Admitting: Gastroenterology

## 2020-11-21 ENCOUNTER — Other Ambulatory Visit: Payer: Self-pay | Admitting: Internal Medicine

## 2020-11-21 NOTE — Telephone Encounter (Signed)
PDMP okay, Rx sent.  He is due for a nonurgent checkup: send him a message

## 2020-11-21 NOTE — Telephone Encounter (Signed)
Mychart message sent.

## 2020-11-21 NOTE — Telephone Encounter (Signed)
Requesting:xanax 0.5 mg Contract:04/13/20 UDS:04/13/20 Last Visit:04/13/20 Next Visit:unknown Last Refill:05/20/20  Please Advise

## 2020-11-22 ENCOUNTER — Telehealth: Payer: Self-pay | Admitting: Gastroenterology

## 2020-11-22 NOTE — Telephone Encounter (Signed)
Left message for patient to return my call.

## 2020-11-22 NOTE — Telephone Encounter (Signed)
Hey Dr. Fuller Plan,   Patient is requesting a virtual visit for his annual f/u for medication refill. He states he is not having any GI issues he just does not want to come in the office because COVID numbers are rising again. Could you please advise on scheduling?  Thank you

## 2020-11-23 MED ORDER — DEXLANSOPRAZOLE 60 MG PO CPDR: 60.0000 mg | DELAYED_RELEASE_CAPSULE | Freq: Every day | ORAL | 1 refills | Status: DC

## 2020-11-23 NOTE — Telephone Encounter (Signed)
Scheduled patient for my chart video visit on 01/10/21 at 1:30 pm. Prescription sent to patient's pharmacy until appt.

## 2020-11-23 NOTE — Telephone Encounter (Signed)
Please look for a good time for [place] to do a virtual visit for his med refill.

## 2020-11-28 ENCOUNTER — Other Ambulatory Visit: Payer: Self-pay | Admitting: Internal Medicine

## 2020-12-02 ENCOUNTER — Other Ambulatory Visit: Payer: Self-pay | Admitting: Internal Medicine

## 2020-12-08 ENCOUNTER — Other Ambulatory Visit: Payer: Self-pay | Admitting: Internal Medicine

## 2020-12-15 ENCOUNTER — Other Ambulatory Visit: Payer: Self-pay | Admitting: Gastroenterology

## 2020-12-26 ENCOUNTER — Other Ambulatory Visit: Payer: Self-pay | Admitting: Internal Medicine

## 2020-12-29 ENCOUNTER — Encounter: Payer: Self-pay | Admitting: Internal Medicine

## 2020-12-29 ENCOUNTER — Other Ambulatory Visit: Payer: Self-pay

## 2020-12-29 ENCOUNTER — Ambulatory Visit (INDEPENDENT_AMBULATORY_CARE_PROVIDER_SITE_OTHER): Payer: Medicare Other | Admitting: Internal Medicine

## 2020-12-29 VITALS — BP 148/84 | HR 69 | Temp 97.8°F | Resp 18 | Ht 69.0 in | Wt 194.5 lb

## 2020-12-29 DIAGNOSIS — I10 Essential (primary) hypertension: Secondary | ICD-10-CM | POA: Diagnosis not present

## 2020-12-29 DIAGNOSIS — R635 Abnormal weight gain: Secondary | ICD-10-CM

## 2020-12-29 DIAGNOSIS — R972 Elevated prostate specific antigen [PSA]: Secondary | ICD-10-CM | POA: Diagnosis not present

## 2020-12-29 DIAGNOSIS — E785 Hyperlipidemia, unspecified: Secondary | ICD-10-CM | POA: Diagnosis not present

## 2020-12-29 DIAGNOSIS — Z23 Encounter for immunization: Secondary | ICD-10-CM

## 2020-12-29 LAB — BASIC METABOLIC PANEL
BUN: 12 mg/dL (ref 6–23)
CO2: 29 mEq/L (ref 19–32)
Calcium: 9.4 mg/dL (ref 8.4–10.5)
Chloride: 99 mEq/L (ref 96–112)
Creatinine, Ser: 0.99 mg/dL (ref 0.40–1.50)
GFR: 79.68 mL/min (ref >60.00)
Glucose, Bld: 98 mg/dL (ref 70–99)
Potassium: 4.5 mEq/L (ref 3.5–5.1)
Sodium: 137 mEq/L (ref 135–145)

## 2020-12-29 LAB — HEMOGLOBIN A1C: Hgb A1c MFr Bld: 5.8 % (ref 4.6–6.5)

## 2020-12-29 MED ORDER — ALPRAZOLAM 0.5 MG PO TABS: 0.5000 mg | ORAL_TABLET | Freq: Two times a day (BID) | ORAL | 4 refills | Status: DC

## 2020-12-29 NOTE — Progress Notes (Signed)
Subjective:    Patient ID: Ryan Smith, male    DOB: November 04, 1955, 65 y.o.   MRN: [id number]  DOS:  12/29/2020 Type of visit - description: Follow-up Since the last office visit is doing well. We talk about high blood pressure. He has gained a couple of pounds and he is concerned about it.  BP Readings from Last 3 Encounters:  12/29/20 (!) 148/84  04/13/20 137/85  01/22/20 131/74   Wt Readings from Last 3 Encounters:  12/29/20 194 lb 8 oz (88.2 kg)  04/13/20 192 lb 6 oz (87.3 kg)  01/22/20 191 lb 1.9 oz (86.7 kg)     Review of Systems He denies chest pain or claudication  Past Medical History:  Diagnosis Date   Abnormal LFTs    2008 neg. hep. serology, (-) ANA, ceruloplasmin, etc.;  liver biopsy showed  minimal active inflammation   Abnormal LFTs (liver function tests) 08/15/2010   increased LFTs: 2008 neg. hep. serology, (-) ANA, ceruloplasmin, etc.; liver biopsy showed minimal active inflammation     Anxiety    Anxiety state 08/30/2006   Qualifier: Diagnosis of  By: Carley Hammed      Atherosclerosis of native arteries of the extremities with intermittent claudication 11/30/2013   Barrett's esophagus    BCC (basal cell carcinoma of skin)    Cerumen impaction 03/03/2014   Claudication ([place]) 11/18/2013   COMMON MIGRAINE 08/30/2006   Qualifier: Diagnosis of  By: Carley Hammed     Diverticulosis 08/15/2010   Elevated PSA    Esophageal reflux    GERD (gastroesophageal reflux disease) 08/15/2010   Hearing loss    has hearing aids   Hiatal hernia    HIATAL HERNIA WITH REFLUX 12/21/2009   Qualifier: Diagnosis of  By: Nils Pyle CMA ([place]), Leisha     History of diverticulitis of colon    Hx of adenomatous colonic polyps 08/15/2010   Hyperlipidemia 10/03/2011   Hypertension, essential 07/22/2019   INSOMNIA 08/30/2006   Qualifier: Diagnosis of  By: Carley Hammed     LOSS, HEARING NOS 08/30/2006   Qualifier: Diagnosis of  By: Carley Hammed     Other specified disorders of  liver 11/15/2009   Qualifier: Diagnosis of  Problem Stop Reason:  By: Sharlett Iles MD Byrd Hesselbach    Personal history of colonic polyps    tublar adenomas 2008 & hyerplastic 2011   Preop cardiovascular exam 01/19/2014   PVD (peripheral vascular disease) ([place])    PVD (peripheral vascular disease) with claudication ([place]) 01/04/2014   SCC (squamous cell carcinoma)    L shoulder   Tobacco use 11/24/2013    Past Surgical History:  Procedure Laterality Date   ABDOMINAL AORTAGRAM N/A 11/25/2013   Procedure: ABDOMINAL Maxcine Ham;  Surgeon: Wellington Hampshire, MD;  Location: [facility];  Service: Cardiovascular;  Laterality: N/A;   COLONOSCOPY  02/07/2016   Laparoscopically-assisted Sigmoid Colectomy  11/10/2007   LIVER BIOPSY     minimal active inflammation   UPPER GASTROINTESTINAL ENDOSCOPY  02/07/2016    Allergies as of 12/29/2020   No Known Allergies      Medication List        Accurate as of December 29, 2020  8:56 PM. If you have any questions, ask your nurse or doctor.          ALPRAZolam 0.5 MG tablet Commonly known as: XANAX Take 1 tablet (0.5 mg total) by mouth 2 (two) times daily.   aspirin EC 81 MG tablet Take 81 mg  by mouth daily.   Coenzyme Q10 10 MG capsule Take 10 mg by mouth daily.   D-Limonene 1 g Caps Take 1 g by mouth as needed.   dexlansoprazole 60 MG capsule Commonly known as: DEXILANT TAKE 1 CAPSULE BY MOUTH EVERY DAY   ezetimibe 10 MG tablet Commonly known as: ZETIA Take 1 tablet (10 mg total) by mouth daily.   hydrochlorothiazide 25 MG tablet Commonly known as: HYDRODIURIL TAKE 1 TABLET EVERY DAY   losartan 25 MG tablet Commonly known as: COZAAR TAKE 1 TABLET (25 MG TOTAL) BY MOUTH DAILY.   Magnesium 500 MG Caps Take 1 capsule by mouth at bedtime.   metoprolol succinate 25 MG 24 hr tablet Commonly known as: TOPROL-XL TAKE 1 TABLET BY MOUTH EVERY DAY   nitroGLYCERIN 0.4 MG SL tablet Commonly known as: NITROSTAT Place 1 tablet (0.4 mg  total) under the tongue every 5 (five) minutes as needed for chest pain.   psyllium 58.6 % powder Commonly known as: METAMUCIL Take 1 packet by mouth at bedtime.   rosuvastatin 20 MG tablet Commonly known as: CRESTOR TAKE 1 TABLET BY MOUTH EVERY DAY           Objective:   Physical Exam BP (!) 148/84 (BP Location: Left Arm, Patient Position: Sitting, Cuff Size: Small)   Pulse 69   Temp 97.8 F (36.6 C) (Oral)   Resp 18   Ht 5\' 9"  (1.753 m)   Wt 194 lb 8 oz (88.2 kg)   SpO2 97%   BMI 28.72 kg/m  General:   Well developed, NAD, BMI noted. HEENT:  Normocephalic . Face symmetric, atraumatic Lungs:  CTA B Normal respiratory effort, no intercostal retractions, no accessory muscle use. Heart: RRR,  no murmur.  Lower extremities: no pretibial edema bilaterally  Skin: Not pale. Not jaundice Neurologic:  alert & oriented X3.  Speech normal, gait appropriate for age and unassisted Psych--  Cognition and judgment appear intact.  Cooperative with normal attention span and concentration.  Behavior appropriate. No anxious or depressed appearing.      Assessment     Assessment HTN: dx 07/2019 (intolerant amlodipine, carvedilol, see OV 09/07/2019) Anxiety Hyperlipidemia  PVD R leg, dx 2015,   claudication 10-2013, ABIs showed obstruction, saw [name], had a aortogram , was referred to surgery. They rx a CT chest show mild atherosclerosis disease of the aorta, coronary arthrosclerosis. Had a MRI of the right leg, see report. Was recommended a bypass but sx  improved, did not proceed. No benefit from cilostazol Nonobstructive CAD -CT coronary morphology  10-2019:Mild to moderate, likely non-obstructive mixed CAD predominantly in the LAD,  GI:  --Barrett's, GERD, last EGD,BX : 02-2016  --Abnormal LFTs: 2008 neg. hep. serology, (-) ANA, ceruloplasmin, etc.;  liver bx:  minimal active inflammation Hearing loss, has aids  Sees dermatology q 6 ,months as off 12-2015 Stress 12-2013:  Low risk Prostate BX 12-2016 (-)  PLAN: HTN: BP today slightly up, at home is consistently 135/70-80.  Very rarely systolic BP is 101. Continue HCTZ, losartan, metoprolol, check BMP, A1c. Weight gain: Has gained a couple of pounds, patient is somewhat concerned about it, we talked extensively about diet. Elevated PSA: Last PSA was 4.3, was rec to see urology but he declines.  PSA has been stable over time. High cholesterol: Last LDL 60, continue Crestor and Zetia. Preventive care: Flu shot today. RTC 4 months CPX    This visit occurred during the SARS-CoV-2 public health emergency.  Safety protocols  were in place, including screening questions prior to the visit, additional usage of staff PPE, and extensive cleaning of exam room while observing appropriate contact time as indicated for disinfecting solutions.

## 2020-12-29 NOTE — Patient Instructions (Addendum)
Recommend to proceed with the following vaccines at your pharmacy:  Covid #4 Shingrix (shingles)  You are due for a colonoscopy in November 2022- please discuss this with Dr. Fuller Plan at your visit next month.   Check the  blood pressure regularly as you are doing BP GOAL is between 110/65 and  135/85. If it is consistently higher or lower, let me know     GO TO THE LAB : Get the blood work     [place], [place] back for a physical exam by 04-2021

## 2020-12-29 NOTE — Assessment & Plan Note (Signed)
HTN: BP today slightly up, at home is consistently 135/70-80.  Very rarely systolic BP is 962. Continue HCTZ, losartan, metoprolol, check BMP, A1c. Weight gain: Has gained a couple of pounds, patient is somewhat concerned about it, we talked extensively about diet. Elevated PSA: Last PSA was 4.3, was rec to see urology but he declines.  PSA has been stable over time. High cholesterol: Last LDL 60, continue Crestor and Zetia. Preventive care: Flu shot today. RTC 4 months CPX

## 2021-01-10 ENCOUNTER — Telehealth (INDEPENDENT_AMBULATORY_CARE_PROVIDER_SITE_OTHER): Payer: Medicare Other | Admitting: Gastroenterology

## 2021-01-10 ENCOUNTER — Encounter: Payer: Self-pay | Admitting: Gastroenterology

## 2021-01-10 VITALS — Ht 69.0 in | Wt 188.0 lb

## 2021-01-10 DIAGNOSIS — K2270 Barrett's esophagus without dysplasia: Secondary | ICD-10-CM

## 2021-01-10 MED ORDER — DEXLANSOPRAZOLE 60 MG PO CPDR: 60.0000 mg | DELAYED_RELEASE_CAPSULE | Freq: Every day | ORAL | 3 refills | Status: DC

## 2021-01-10 NOTE — Patient Instructions (Signed)
We have sent the following medications to your pharmacy for you to pick up at your convenience: Dexilant 60 mg daily.   Thank you for choosing me and [facility].  Pricilla Riffle. Dagoberto Ligas., MD., Marval Regal

## 2021-01-10 NOTE — Progress Notes (Signed)
    History of Present Illness: This is a 65 year old male with Barrett's esophagus and a history of adenomatous colon polyps. Barrett's was not noted on pathology on his last 2 EGDs.  Barrett's without dysplasia was noted on pathology in 2011.  He relates his reflux symptoms have been under very good control.  He occasionally has mild breakthrough reflux symptoms.  No other gastrointestinal complaints. Denies weight loss, abdominal pain, constipation, diarrhea, change in stool caliber, melena, hematochezia, nausea, vomiting, dysphagia, chest pain.   Current Medications, Allergies, Past Medical History, Past Surgical History, Family History and Social History were reviewed in [employer] record.   Physical Exam (video visit): General: Well developed, well nourished, no acute distress Head: Normocephalic and atraumatic Eyes: Sclerae anicteric, EOMI Ears: Normal auditory acuity Psychological:  Alert and cooperative. Normal mood and affect   Assessment and Recommendations:  Barrett's esophagus, short segment, without dysplasia.  Barrett's was not noted on pathology at his last 2 EGDs.  Barrett's without dysplasia was noted on pathology in 2011.  GERD.  Continue Dexilant 60 mg p.o. every morning and famotidine 40 mg p.o. at bedtime.  Closely follow antireflux measures.  Surveillance endoscopy is due in September 2023.  If Barrett's is again not found on pathology consider discontinuing Barrett's surveillance EGDs. Personal history of adenomatous colon polyps.  He requests that we adjust the timing of his surveillance colonoscopy to align with his EGD in September 2023 which is reasonable.   These services were provided via telemedicine, audio and video.  The patient was at home and the provider was in the office, alone.  We discussed the limitations of evaluation and management by telemedicine and the availability of in person appointments.  Patient consented for this  telemedicine visit and is aware of possible charges for this service.  Office CMA or LPN participated in this telemedicine service.  Time spent on call: 10 minutes

## 2021-01-11 ENCOUNTER — Other Ambulatory Visit: Payer: Self-pay | Admitting: Cardiology

## 2021-01-12 DIAGNOSIS — L821 Other seborrheic keratosis: Secondary | ICD-10-CM | POA: Diagnosis not present

## 2021-01-12 DIAGNOSIS — D0439 Carcinoma in situ of skin of other parts of face: Secondary | ICD-10-CM | POA: Diagnosis not present

## 2021-01-12 DIAGNOSIS — D485 Neoplasm of uncertain behavior of skin: Secondary | ICD-10-CM | POA: Diagnosis not present

## 2021-01-12 DIAGNOSIS — L820 Inflamed seborrheic keratosis: Secondary | ICD-10-CM | POA: Diagnosis not present

## 2021-01-12 DIAGNOSIS — Z85828 Personal history of other malignant neoplasm of skin: Secondary | ICD-10-CM | POA: Diagnosis not present

## 2021-01-12 DIAGNOSIS — L918 Other hypertrophic disorders of the skin: Secondary | ICD-10-CM | POA: Diagnosis not present

## 2021-01-12 DIAGNOSIS — L570 Actinic keratosis: Secondary | ICD-10-CM | POA: Diagnosis not present

## 2021-02-04 ENCOUNTER — Other Ambulatory Visit: Payer: Self-pay

## 2021-02-04 ENCOUNTER — Other Ambulatory Visit: Payer: Self-pay | Admitting: Internal Medicine

## 2021-02-04 ENCOUNTER — Other Ambulatory Visit: Payer: Self-pay | Admitting: Cardiology

## 2021-02-06 ENCOUNTER — Other Ambulatory Visit: Payer: Self-pay | Admitting: Internal Medicine

## 2021-02-06 MED ORDER — ROSUVASTATIN CALCIUM 20 MG PO TABS: 20.0000 mg | ORAL_TABLET | Freq: Every day | ORAL | 1 refills | Status: DC

## 2021-02-10 ENCOUNTER — Other Ambulatory Visit: Payer: Self-pay | Admitting: Cardiology

## 2021-02-15 ENCOUNTER — Other Ambulatory Visit: Payer: Self-pay | Admitting: Cardiology

## 2021-02-17 ENCOUNTER — Other Ambulatory Visit: Payer: Self-pay | Admitting: Internal Medicine

## 2021-03-07 ENCOUNTER — Other Ambulatory Visit: Payer: Self-pay | Admitting: Cardiology

## 2021-03-07 MED ORDER — EZETIMIBE 10 MG PO TABS
10.0000 mg | ORAL_TABLET | Freq: Every day | ORAL | 0 refills | Status: DC
Start: 2021-03-07 — End: 2021-04-20

## 2021-03-12 ENCOUNTER — Other Ambulatory Visit: Payer: Self-pay | Admitting: Cardiology

## 2021-03-13 ENCOUNTER — Encounter: Payer: Self-pay | Admitting: Medical

## 2021-03-13 ENCOUNTER — Ambulatory Visit (INDEPENDENT_AMBULATORY_CARE_PROVIDER_SITE_OTHER): Payer: Medicare Other | Admitting: Medical

## 2021-03-13 VITALS — BP 140/88 | HR 72 | Temp 98.1°F | Resp 18 | Ht 69.0 in | Wt 197.6 lb

## 2021-03-13 DIAGNOSIS — I10 Essential (primary) hypertension: Secondary | ICD-10-CM | POA: Diagnosis not present

## 2021-03-13 DIAGNOSIS — H6123 Impacted cerumen, bilateral: Secondary | ICD-10-CM

## 2021-03-13 NOTE — Telephone Encounter (Signed)
Metoprolol Succinate 25 mg # 15 only with message for pharmacy to contact patients PCP for future refills  [id number] - [place], [place] - [address]

## 2021-03-13 NOTE — Patient Instructions (Addendum)
Cerumen impaction lavaged successfully. If recurrent obstruction in future use debrox again as you did and can lavage out. Hopefully won't re-accumalate quickly.  Htn- better controlled on recheck bp. Continue current bp med regimen.  Follow up up as regularly scheduled with pcp or as needed

## 2021-03-13 NOTE — Progress Notes (Signed)
Subjective:    Patient ID: Ryan Smith, male    DOB: 12-12-1955, 65 y.o.   MRN: [id number]  HPI Pt has bilateral ear plugged sensation for about one week.  Pt was using debrox over the counter.  Pt uses hearing aids. About once a year gets ear washed out.  No ear pain. No recent uri type infection or nasal congestion.     Review of Systems  Constitutional:  Negative for chills, fatigue and fever.  HENT:  Negative for congestion, ear pain, facial swelling and mouth sores.        Ear blocked.   Respiratory:  Negative for cough, chest tightness and wheezing.   Cardiovascular:  Negative for chest pain and palpitations.    Past Medical History:  Diagnosis Date   Abnormal LFTs    2008 neg. hep. serology, (-) ANA, ceruloplasmin, etc.;  liver biopsy showed  minimal active inflammation   Abnormal LFTs (liver function tests) 08/15/2010   increased LFTs: 2008 neg. hep. serology, (-) ANA, ceruloplasmin, etc.; liver biopsy showed minimal active inflammation     Anxiety    Anxiety state 08/30/2006   Qualifier: Diagnosis of  By: Carley Hammed      Atherosclerosis of native arteries of the extremities with intermittent claudication 11/30/2013   Barrett's esophagus    BCC (basal cell carcinoma of skin)    Cerumen impaction 03/03/2014   Claudication ([place]) 11/18/2013   COMMON MIGRAINE 08/30/2006   Qualifier: Diagnosis of  By: Carley Hammed     Diverticulosis 08/15/2010   Elevated PSA    Esophageal reflux    GERD (gastroesophageal reflux disease) 08/15/2010   Hearing loss    has hearing aids   Hiatal hernia    HIATAL HERNIA WITH REFLUX 12/21/2009   Qualifier: Diagnosis of  By: Nils Pyle CMA ([place]), Leisha     History of diverticulitis of colon    Hx of adenomatous colonic polyps 08/15/2010   Hyperlipidemia 10/03/2011   Hypertension, essential 07/22/2019   INSOMNIA 08/30/2006   Qualifier: Diagnosis of  By: Carley Hammed     LOSS, HEARING NOS 08/30/2006   Qualifier: Diagnosis of  By:  Carley Hammed     Other specified disorders of liver 11/15/2009   Qualifier: Diagnosis of  Problem Stop Reason:  By: Sharlett Iles MD Byrd Hesselbach    Personal history of colonic polyps    tublar adenomas 2008 & hyerplastic 2011   Preop cardiovascular exam 01/19/2014   PVD (peripheral vascular disease) ([place])    PVD (peripheral vascular disease) with claudication ([place]) 01/04/2014   SCC (squamous cell carcinoma)    L shoulder   Tobacco use 11/24/2013     Social History   Socioeconomic History   Marital status: Married    Spouse name: Not on file   Number of children: 2   Years of education: Not on file   Highest education level: Not on file  Occupational History   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, music, [employer]    Employer: [employer]  Tobacco Use   Smoking status: Former    Years: 40.00    Types: Cigarettes    Quit date: 02/26/2019    Years since quitting: 2.0   Smokeless tobacco: Never   Tobacco comments:       Vaping Use   Vaping Use: Never used  Substance and Sexual Activity   Alcohol use: Yes    Alcohol/week: 20.0 standard drinks    Types: 20 Glasses of wine per week  Comment: 1~ 2 glasses wine glasses daily   Drug use: No   Sexual activity: Not on file  Other Topics Concern   Not on file  Social History Narrative   Born in [place]   Married, 2 step sons (adults)   Social Determinants of Health   Financial Resource Strain: Not on file  Food Insecurity: Not on file  Transportation Needs: Not on file  Physical Activity: Not on file  Stress: Not on file  Social Connections: Not on file  Intimate Partner Violence: Not on file    Past Surgical History:  Procedure Laterality Date   ABDOMINAL AORTAGRAM N/A 11/25/2013   Procedure: ABDOMINAL Maxcine Ham;  Surgeon: Wellington Hampshire, MD;  Location: [facility];  Service: Cardiovascular;  Laterality: N/A;   COLONOSCOPY  02/07/2016   Laparoscopically-assisted Sigmoid Colectomy  11/10/2007   LIVER  BIOPSY     minimal active inflammation   UPPER GASTROINTESTINAL ENDOSCOPY  02/07/2016    Family History  Problem Relation Age of Onset   Prostate cancer Father 54   Esophageal cancer Father    Coronary artery disease Father        dx age 53, CABG   Heart disease Father    Heart disease Paternal Uncle        x 2   Diverticulitis Mother    Colon cancer Neg Hx    Diabetes Neg Hx    Rectal cancer Neg Hx    Stomach cancer Neg Hx    Colon polyps Neg Hx     No Known Allergies  Current Outpatient Medications on File Prior to Visit  Medication Sig Dispense Refill   ALPRAZolam (XANAX) 0.5 MG tablet Take 1 tablet (0.5 mg total) by mouth 2 (two) times daily. 60 tablet 4   aspirin EC 81 MG tablet Take 81 mg by mouth daily.     Coenzyme Q10 10 MG capsule Take 10 mg by mouth daily.     dexlansoprazole (DEXILANT) 60 MG capsule Take 1 capsule (60 mg total) by mouth daily. 90 capsule 3   ezetimibe (ZETIA) 10 MG tablet Take 1 tablet (10 mg total) by mouth daily. Patient needs appointment for further refills. 1 st attempt 15 tablet 0   hydrochlorothiazide (HYDRODIURIL) 25 MG tablet TAKE 1 TABLET EVERY DAY 90 tablet 1   losartan (COZAAR) 25 MG tablet TAKE 1 TABLET (25 MG TOTAL) BY MOUTH DAILY. 90 tablet 1   Magnesium 500 MG CAPS Take 1 capsule by mouth at bedtime.     metoprolol succinate (TOPROL-XL) 25 MG 24 hr tablet PLEASE SEE ATTACHED FOR DETAILED DIRECTIONS 15 tablet 0   Orange Peel (D-LIMONENE) 1 g CAPS Take 1 g by mouth as needed.      psyllium (METAMUCIL) 58.6 % powder Take 1 packet by mouth at bedtime.     rosuvastatin (CRESTOR) 20 MG tablet Take 1 tablet (20 mg total) by mouth daily. 90 tablet 1   nitroGLYCERIN (NITROSTAT) 0.4 MG SL tablet Place 1 tablet (0.4 mg total) under the tongue every 5 (five) minutes as needed for chest pain. (Patient not taking: No sig reported) 90 tablet 3   No current facility-administered medications on file prior to visit.    BP (!) 156/87   Pulse 72    Temp 98.1 F (36.7 C)   Resp 18   Ht 5\' 9"  (1.753 m)   Wt 197 lb 9.6 oz (89.6 kg)   SpO2 100%   BMI 29.18 kg/m  Objective:   Physical Exam  General- No acute distress. Pleasant patient. Ears- severe blocked wax bilaterally.  Post lavage all the wax cleared. TM intact and normal post lavae.      Assessment & Plan:   Patient Instructions  Cerumen impaction lavaged successfully. If recurrent obstruction in future use debrox again as you did and can lavage out. Hopefully won't re-accumalate quickly.  Htn- better controlled on recheck bp. Continue current bp med regimen.  Follow up up as regularly scheduled with pcp or as needed   Mackie Pai, PA-C

## 2021-03-18 ENCOUNTER — Other Ambulatory Visit: Payer: Self-pay | Admitting: Cardiology

## 2021-03-20 ENCOUNTER — Other Ambulatory Visit: Payer: Self-pay | Admitting: Cardiology

## 2021-03-24 ENCOUNTER — Other Ambulatory Visit: Payer: Self-pay | Admitting: Cardiology

## 2021-03-29 ENCOUNTER — Other Ambulatory Visit: Payer: Self-pay | Admitting: Cardiology

## 2021-03-30 ENCOUNTER — Telehealth: Payer: Medicare Other | Admitting: Physician Assistant

## 2021-03-30 ENCOUNTER — Encounter: Payer: Self-pay | Admitting: Internal Medicine

## 2021-03-30 ENCOUNTER — Other Ambulatory Visit: Payer: Self-pay | Admitting: Cardiology

## 2021-03-30 DIAGNOSIS — U071 COVID-19: Secondary | ICD-10-CM | POA: Diagnosis not present

## 2021-03-30 MED ORDER — FLUTICASONE PROPIONATE 50 MCG/ACT NA SUSP: 2.0000 | Freq: Every day | NASAL | 0 refills | Status: DC

## 2021-03-30 NOTE — Progress Notes (Signed)
E-Visit  for Positive Covid Test Result  We are sorry you are not feeling well. We are here to help!  You have tested positive for COVID-19, meaning that you were infected with the novel coronavirus and could give the virus to others.  It is vitally important that you stay home so you do not spread it to others.      Please continue isolation at home, for at least 10 days since the start of your symptoms and until you have had 24 hours with no fever (without taking a fever reducer) and with improving of symptoms.  If you have no symptoms but tested positive (or all symptoms resolve after 5 days and you have no fever) you can leave your house but continue to wear a mask around others for an additional 5 days. If you have a fever,continue to stay home until you have had 24 hours of no fever. Most cases improve 5-10 days from onset but we have seen a small number of patients who have gotten worse after the 10 days.  Please be sure to watch for worsening symptoms and remain taking the proper precautions.   Go to the nearest hospital ED for assessment if fever/cough/breathlessness are severe or illness seems like a threat to life.    The following symptoms may appear 2-14 days after exposure: Fever Cough Shortness of breath or difficulty breathing Chills Repeated shaking with chills Muscle pain Headache Sore throat New loss of taste or smell Fatigue Congestion or runny nose Nausea or vomiting Diarrhea  You have been enrolled in [facility] for COVID-19. Daily you will receive a questionnaire within the [facility] website. Our COVID-19 response team will be monitoring your responses daily.  You can use medication such as prescription for Fluticasone nasal spray 2 sprays in each nostril one time per day and over the counter Coricidin.   You may also take acetaminophen (Tylenol) as needed for fever.  HOME CARE: Only take medications as instructed by your medical team. Drink  plenty of fluids and get plenty of rest. A steam or ultrasonic humidifier can help if you have congestion.   GET HELP RIGHT AWAY IF YOU HAVE EMERGENCY WARNING SIGNS.  Call 911 or proceed to your closest emergency facility if: You develop worsening high fever. Trouble breathing Bluish lips or face Persistent pain or pressure in the chest New confusion Inability to wake or stay awake You cough up blood. Your symptoms become more severe Inability to hold down food or fluids  This list is not all possible symptoms. Contact your medical provider for any symptoms that are severe or concerning to you.    Your e-visit answers were reviewed by a board certified advanced clinical practitioner to complete your personal care plan.  Depending on the condition, your plan could have included both over the counter or prescription medications.  If there is a problem please reply once you have received a response from your provider.  Your safety is important to [place].  If you have drug allergies check your prescription carefully.    You can use MyChart to ask questions about today's visit, request a non-urgent call back, or ask for a work or school excuse for 24 hours related to this e-Visit. If it has been greater than 24 hours you will need to follow up with your provider, or enter a new e-Visit to address those concerns. You will get an e-mail in the next two days asking about your experience.  I hope that your e-visit has been valuable and will speed your recovery. Thank you for using e-visits.     Greater than 5 minutes, yet less than 10 minutes of time have been spent researching, coordinating, and implementing care for this patient today

## 2021-04-06 ENCOUNTER — Other Ambulatory Visit: Payer: Self-pay | Admitting: Cardiology

## 2021-04-07 ENCOUNTER — Encounter: Payer: Self-pay | Admitting: Gastroenterology

## 2021-04-07 NOTE — Telephone Encounter (Signed)
This is Dr. Munley's pt 

## 2021-04-20 ENCOUNTER — Telehealth: Payer: Self-pay | Admitting: Cardiology

## 2021-04-20 ENCOUNTER — Other Ambulatory Visit: Payer: Self-pay | Admitting: Cardiology

## 2021-04-20 MED ORDER — EZETIMIBE 10 MG PO TABS: 10.0000 mg | ORAL_TABLET | Freq: Every day | ORAL | 0 refills | Status: DC

## 2021-04-20 NOTE — Telephone Encounter (Signed)
Refill sent in per request.  

## 2021-04-20 NOTE — Telephone Encounter (Signed)
*  STAT* If patient is at the pharmacy, call can be transferred to refill team.   1. Which medications need to be refilled? (please list name of each medication and dose if known)  metoprolol succinate (TOPROL-XL) 25 MG 24 hr tablet ezetimibe (ZETIA) 10 MG tablet  2. Which pharmacy/location (including street and city if local pharmacy) is medication to be sent to? [id number] [place], [place] - [address]  3. Do they need a 30 day or 90 day supply?   90 day supply  Patient states he is completely out of medication. He scheduled an appointment for 04/18 with Dr. Bettina Gavia.

## 2021-04-21 MED ORDER — METOPROLOL SUCCINATE ER 25 MG PO TB24: 25.0000 mg | ORAL_TABLET | Freq: Every day | ORAL | 0 refills | Status: DC

## 2021-05-09 ENCOUNTER — Other Ambulatory Visit: Payer: Self-pay | Admitting: Cardiology

## 2021-05-23 ENCOUNTER — Encounter: Payer: Self-pay | Admitting: Internal Medicine

## 2021-06-16 ENCOUNTER — Other Ambulatory Visit: Payer: Self-pay | Admitting: Internal Medicine

## 2021-06-16 NOTE — Telephone Encounter (Signed)
Rx denied. Pt overdue for cpx- letter sent on 05/23/21. ?

## 2021-06-22 ENCOUNTER — Encounter: Payer: Self-pay | Admitting: Internal Medicine

## 2021-06-22 ENCOUNTER — Ambulatory Visit (INDEPENDENT_AMBULATORY_CARE_PROVIDER_SITE_OTHER): Payer: Medicare Other | Admitting: Internal Medicine

## 2021-06-22 VITALS — BP 140/78 | HR 68 | Temp 97.3°F | Resp 16 | Ht 68.0 in | Wt 194.0 lb

## 2021-06-22 DIAGNOSIS — R972 Elevated prostate specific antigen [PSA]: Secondary | ICD-10-CM

## 2021-06-22 DIAGNOSIS — E785 Hyperlipidemia, unspecified: Secondary | ICD-10-CM

## 2021-06-22 DIAGNOSIS — Z0000 Encounter for general adult medical examination without abnormal findings: Secondary | ICD-10-CM

## 2021-06-22 DIAGNOSIS — R739 Hyperglycemia, unspecified: Secondary | ICD-10-CM

## 2021-06-22 DIAGNOSIS — I10 Essential (primary) hypertension: Secondary | ICD-10-CM | POA: Diagnosis not present

## 2021-06-22 MED ORDER — NITROGLYCERIN 0.4 MG SL SUBL: 0.4000 mg | SUBLINGUAL_TABLET | SUBLINGUAL | 3 refills | Status: DC | PRN

## 2021-06-22 NOTE — Progress Notes (Signed)
? ?Subjective:  ? ? Patient ID: Ryan Smith, male    DOB: [birth year], 66 y.o.   MRN: [id number] ? ?DOS:  06/22/2021 ?Type of visit - description: CPX ? ?Here for CPX. ?In general feels well. ?Has no major concerns. ? ? ?Review of Systems ?  ?A 14 point review of systems is negative  ? ? ?Past Medical History:  ?Diagnosis Date  ? Abnormal LFTs   ? 2008 neg. hep. serology, (-) ANA, ceruloplasmin, etc.;  liver biopsy showed  minimal active inflammation  ? Abnormal LFTs (liver function tests) 08/15/2010  ? increased LFTs: 2008 neg. hep. serology, (-) ANA, ceruloplasmin, etc.; liver biopsy showed minimal active inflammation    ? Anxiety   ? Anxiety state 08/30/2006  ? Qualifier: Diagnosis of  By: Carley Hammed     ? Atherosclerosis of native arteries of the extremities with intermittent claudication 11/30/2013  ? Barrett's esophagus   ? BCC (basal cell carcinoma of skin)   ? Cerumen impaction 03/03/2014  ? Claudication ([place]) 11/18/2013  ? COMMON MIGRAINE 08/30/2006  ? Qualifier: Diagnosis of  By: Carley Hammed    ? Diverticulosis 08/15/2010  ? Elevated PSA   ? Esophageal reflux   ? GERD (gastroesophageal reflux disease) 08/15/2010  ? Hearing loss   ? has hearing aids  ? Hiatal hernia   ? HIATAL HERNIA WITH REFLUX 12/21/2009  ? Qualifier: Diagnosis of  By: Nils Pyle CMA Deborra Medina), Mearl Latin    ? History of diverticulitis of colon   ? Hx of adenomatous colonic polyps 08/15/2010  ? Hyperlipidemia 10/03/2011  ? Hypertension, essential 07/22/2019  ? INSOMNIA 08/30/2006  ? Qualifier: Diagnosis of  By: Carley Hammed    ? LOSS, HEARING NOS 08/30/2006  ? Qualifier: Diagnosis of  By: Carley Hammed    ? Other specified disorders of liver 11/15/2009  ? Qualifier: Diagnosis of  Problem Stop Reason:  By: Sharlett Iles MD Byrd Hesselbach   ? Personal history of colonic polyps   ? tublar adenomas 2008 & hyerplastic 2011  ? Preop cardiovascular exam 01/19/2014  ? PVD (peripheral vascular disease) ([place])   ? PVD (peripheral vascular disease) with  claudication ([place]) 01/04/2014  ? SCC (squamous cell carcinoma)   ? L shoulder  ? Tobacco use 11/24/2013  ? ? ?Past Surgical History:  ?Procedure Laterality Date  ? ABDOMINAL AORTAGRAM N/A 11/25/2013  ? Procedure: ABDOMINAL AORTAGRAM;  Surgeon: Wellington Hampshire, MD;  Location: [facility] CATH LAB;  Service: Cardiovascular;  Laterality: N/A;  ? COLONOSCOPY  02/07/2016  ? Laparoscopically-assisted Sigmoid Colectomy  11/10/2007  ? LIVER BIOPSY    ? minimal active inflammation  ? UPPER GASTROINTESTINAL ENDOSCOPY  02/07/2016  ? ?Social History  ? ?Socioeconomic History  ? Marital status: Married  ?  Spouse name: Not on file  ? Number of children: 2  ? Years of education: Not on file  ? Highest education level: Not on file  ?Occupational History  ? 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  Employer: [employer]  ?Tobacco Use  ? Smoking status: Former  ?  Years: 40.00  ?  Types: Cigarettes  ?  Quit date: 02/26/2019  ?  Years since quitting: 2.3  ? Smokeless tobacco: Never  ? Tobacco comments:  ?     ?Vaping Use  ? Vaping Use: Never used  ?Substance and Sexual Activity  ? Alcohol use: Yes  ?  Alcohol/week: 20.0 standard drinks  ?  Types: 20 Glasses of wine per week  ?  Comment: 3-4 glasses of wine qhs  ? Drug use: No  ? Sexual activity: Not on file  ?Other Topics Concern  ? Not on file  ?Social History Narrative  ? Born in [place]  ? Married, 2 step sons (adults)  ? ?Social Determinants of Health  ? ?Financial Resource Strain: Not on file  ?Food Insecurity: Not on file  ?Transportation Needs: Not on file  ?Physical Activity: Not on file  ?Stress: Not on file  ?Social Connections: Not on file  ?Intimate Partner Violence: Not on file  ? ? ?Current Outpatient Medications  ?Medication Instructions  ? ALPRAZolam (XANAX) 0.5 mg, Oral, 2 times daily  ? aspirin EC 81 mg, Oral, Daily  ? Coenzyme Q10 10 mg, Daily  ? D-Limonene 1 g, Oral, As needed  ? dexlansoprazole (DEXILANT) 60 mg, Oral, Daily  ? ezetimibe (ZETIA) 10  mg, Oral, Daily, Patient needs appointment for further refills. 1 st attempt  ? fluticasone (FLONASE) 50 MCG/ACT nasal spray 2 sprays, Each Nare, Daily  ? hydrochlorothiazide (HYDRODIURIL) 25 MG tablet TAKE 1 TABLET EVERY DAY  ? losartan (COZAAR) 25 MG tablet TAKE 1 TABLET (25 MG TOTAL) BY MOUTH DAILY.  ? Magnesium 500 MG CAPS 1 capsule, Oral, Daily at bedtime,    ? metoprolol succinate (TOPROL-XL) 25 mg, Oral, Daily  ? nitroGLYCERIN (NITROSTAT) 0.4 mg, Sublingual, Every 5 min PRN  ? psyllium (METAMUCIL) 58.6 % powder 1 packet, Oral, Daily at bedtime  ? rosuvastatin (CRESTOR) 20 mg, Oral, Daily  ? ? ?   ?Objective:  ? Physical Exam ?BP 140/78 (BP Location: Right Arm, Patient Position: Sitting, Cuff Size: Normal)   Pulse 68   Temp (!) 97.3 ?F (36.3 ?C) (Oral)   Resp 16   Ht '5\' 8"'$  (1.727 m)   Wt 194 lb (88 kg)   SpO2 98%   BMI 29.50 kg/m?  ?General: ?Well developed, NAD, BMI noted ?Neck: No  thyromegaly  ?HEENT:  ?Normocephalic . Face symmetric, atraumatic ?Lungs:  ?CTA B ?Normal respiratory effort, no intercostal retractions, no accessory muscle use. ?Heart: RRR,  no murmur.  ?Abdomen:  ?Not distended, soft, non-tender. No rebound or rigidity.  Reducible umbilical hernia noted. ?Lower extremities: no pretibial edema bilaterally ?DRE: + External tags, sphincter normal, prostate: Slightly enlarged, firm, symmetric, no nodule, nontender ?Skin: Exposed areas without rash. Not pale. Not jaundice ?Neurologic:  ?alert & oriented X3.  ?Speech normal, gait appropriate for age and unassisted ?Strength symmetric and appropriate for age.  ?Psych: ?Cognition and judgment appear intact.  ?Cooperative with normal attention span and concentration.  ?Behavior appropriate. ?No anxious or depressed appearing. ? ?   ?Assessment   ? ? Assessment ?HTN: dx 07/2019 (intolerant amlodipine, carvedilol, see OV 09/07/2019) ?Anxiety ?Hyperlipidemia  ?PVD R leg, dx 2015,   ?claudication 10-2013, ABIs showed obstruction, saw [name], had a  aortogram , was referred to surgery. They rx a CT chest show mild atherosclerosis disease of the aorta, coronary arthrosclerosis. Had a MRI of the right leg, see report. Was recommended a bypass but sx  improved, did not proceed. No benefit from cilostazol ?Nonobstructive CAD ?-CT coronary morphology  10-2019:Mild to moderate, likely non-obstructive mixed CAD predominantly in the LAD,  ?GI:  ?--Barrett's, GERD, last EGD,BX : 02-2016  ?--Abnormal LFTs: 2008 neg. hep. serology, (-) ANA, ceruloplasmin, etc.;  liver bx:  minimal active inflammation ?Hearing loss, has aids  ?Sees dermatology  as off 05-2021 ?Stress 12-2013: Low risk ?Prostate BX 12-2016 (-) ? ?PLAN: ?Here for CPX ?  Mild hyperglycemia noted: Check A1c ?HTN: BP satisfactory today, recommend to check ambulatory BPs, continue HCTZ losartan, metoprolol.  Labs. ?Hyperlipidemia: On Zetia, Crestor.  Labs. ?Nonobstructive CAD: No symptoms, recommend to change NTG every 6 months.  Rx sent. ?Barrett's: Per GI, on PPIs ?RTC 6 months ? ? ? ? ? ?This visit occurred during the SARS-CoV-2 public health emergency.  Safety protocols were in place, including screening questions prior to the visit, additional usage of staff PPE, and extensive cleaning of exam room while observing appropriate contact time as indicated for disinfecting solutions.  ? ?

## 2021-06-22 NOTE — Patient Instructions (Addendum)
Vaccines I recommend at your convenience: ?Shingrix ?Pneumonia shot ? ? ?Check the  blood pressure regularly ?BP GOAL is between 110/65 and  135/85. ?If it is consistently higher or lower, let me know ? ?  ? ?GO TO THE LAB : Get the blood work   ? ? ?[place], [place] ?Come back for checkup in 6 months ? ? ? ?"Living will", "Health Care Power of attorney": Advanced care planning ? ?(If you already have a living will or healthcare power of attorney, please bring the copy to be scanned in your chart.) ? ?Advance care planning is a process that supports adults in  understanding and sharing their preferences regarding future medical care.  ? ?The patient's preferences are recorded in documents called Advance Directives.    ?Advanced directives are completed (and can be modified at any time) while the patient is in full mental capacity.  ? ?The documentation should be available at all times to the patient, the family and the healthcare providers.  ?Bring in a copy to be scanned in your chart is an excellent idea and is recommended  ? ?This legal documents direct treatment decision making and/or appoint a surrogate to make the decision if the patient is not capable to do so.  ? ? ?Advance directives can be documented in many types of formats,  documents have names such as:  ?Lliving will  ?Durable power of attorney for healthcare (healthcare proxy or healthcare power of attorney)  ?Combined directives  ?Physician orders for life-sustaining treatment  ?  ?More information at: ? ?[email or web address]  ?

## 2021-06-23 ENCOUNTER — Encounter: Payer: Self-pay | Admitting: Internal Medicine

## 2021-06-23 LAB — CBC WITH DIFFERENTIAL/PLATELET
Basophils Absolute: 0.1 10*3/uL (ref 0.0–0.1)
Basophils Relative: 1.2 % (ref 0.0–3.0)
Eosinophils Absolute: 0.1 10*3/uL (ref 0.0–0.7)
Eosinophils Relative: 2.2 % (ref 0.0–5.0)
HCT: 47.9 % (ref 39.0–52.0)
Hemoglobin: 16.1 g/dL (ref 13.0–17.0)
Lymphocytes Relative: 31.5 % (ref 12.0–46.0)
Lymphs Abs: 2.1 10*3/uL (ref 0.7–4.0)
MCHC: 33.6 g/dL (ref 30.0–36.0)
MCV: 91.4 fl (ref 78.0–100.0)
Monocytes Absolute: 0.7 10*3/uL (ref 0.1–1.0)
Monocytes Relative: 10 % (ref 3.0–12.0)
Neutro Abs: 3.7 10*3/uL (ref 1.4–7.7)
Neutrophils Relative %: 55.1 % (ref 43.0–77.0)
Platelets: 381 10*3/uL (ref 150.0–400.0)
RBC: 5.24 Mil/uL (ref 4.22–5.81)
RDW: 14 % (ref 11.5–15.5)
WBC: 6.7 10*3/uL (ref 4.0–10.5)

## 2021-06-23 LAB — LIPID PANEL
Cholesterol: 133 mg/dL (ref 0–200)
HDL: 44.5 mg/dL (ref >39.00)
LDL Cholesterol: 63 mg/dL (ref 0–99)
NonHDL: 88.98
Total CHOL/HDL Ratio: 3
Triglycerides: 131 mg/dL (ref 0.0–149.0)
VLDL: 26.2 mg/dL (ref 0.0–40.0)

## 2021-06-23 LAB — COMPREHENSIVE METABOLIC PANEL
ALT: 46 U/L (ref 0–53)
AST: 36 U/L (ref 0–37)
Albumin: 4.7 g/dL (ref 3.5–5.2)
Alkaline Phosphatase: 88 U/L (ref 39–117)
BUN: 10 mg/dL (ref 6–23)
CO2: 31 mEq/L (ref 19–32)
Calcium: 9.6 mg/dL (ref 8.4–10.5)
Chloride: 105 mEq/L (ref 96–112)
Creatinine, Ser: 1.02 mg/dL (ref 0.40–1.50)
GFR: 76.62 mL/min (ref >60.00)
Glucose, Bld: 93 mg/dL (ref 70–99)
Potassium: 5.1 mEq/L (ref 3.5–5.1)
Sodium: 142 mEq/L (ref 135–145)
Total Bilirubin: 0.8 mg/dL (ref 0.2–1.2)
Total Protein: 6.7 g/dL (ref 6.0–8.3)

## 2021-06-23 LAB — HEMOGLOBIN A1C: Hgb A1c MFr Bld: 5.8 % (ref 4.6–6.5)

## 2021-06-23 NOTE — Assessment & Plan Note (Signed)
Here for CPX ?Mild hyperglycemia noted: Check A1c ?HTN: BP satisfactory today, recommend to check ambulatory BPs, continue HCTZ losartan, metoprolol.  Labs. ?Hyperlipidemia: On Zetia, Crestor.  Labs. ?Nonobstructive CAD: No symptoms, recommend to change NTG every 6 months.  Rx sent. ?Barrett's: Per GI, on PPIs ?RTC 6 months ? ?

## 2021-06-23 NOTE — Assessment & Plan Note (Signed)
--  Td  05-2016  ?- pnm 23: 2016 ?-PNM 20: Declined ?-  shingrix: Recommended ?-COVID-vaccine: UTD ?--CCS: last colonoscopy 10/2009, polyp,   cscope 02-2016: + Polyps, 5 years.  Aware he is due for a colonoscopy, already received a letter, plans to proceed ?--Prostate cancer screening: 12-2016 prostate bx (-)   last PSA was 4.3, still elevated, declined to see urology.  DRE today okay, recheck PSA.  We will see urology only if "it is much higher than before" ?--Exercise-diet: Discussed ?--Tobacco: Quit 02-2019, praised  ?--Lung cancer screening:   does not qualify under Medicare guidelines.   ?-- AAA  Screening (-) 02/2020 ?--Labs:  CMP, FLP, CBC, A1c, PSA ?- ACP information provided ?  ?

## 2021-06-27 LAB — PSA: PSA: 4.55 ng/mL — ABNORMAL HIGH (ref 0.10–4.00)

## 2021-06-28 ENCOUNTER — Telehealth: Payer: Self-pay

## 2021-06-28 MED ORDER — ALPRAZOLAM 0.5 MG PO TABS
0.5000 mg | ORAL_TABLET | Freq: Two times a day (BID) | ORAL | 4 refills | Status: DC
Start: 2021-06-28 — End: 2021-12-03

## 2021-06-28 NOTE — Telephone Encounter (Signed)
Requesting: alprazolam 0.'5mg'$  ?Contract: 04/13/2020 ?UDS: 04/13/20 ?Last Visit: 06/22/21 ?Next Visit: None, due in October ?Last Refill: 12/29/2020 #60 and 4RF ? ?Please Advise ? ?

## 2021-06-28 NOTE — Telephone Encounter (Signed)
PDMP okay, Rx sent 

## 2021-07-13 ENCOUNTER — Other Ambulatory Visit: Payer: Self-pay | Admitting: Cardiology

## 2021-07-13 ENCOUNTER — Encounter: Payer: Self-pay | Admitting: Cardiology

## 2021-07-13 ENCOUNTER — Other Ambulatory Visit: Payer: Self-pay

## 2021-07-13 MED ORDER — EZETIMIBE 10 MG PO TABS: 10.0000 mg | ORAL_TABLET | Freq: Every day | ORAL | 0 refills | Status: DC

## 2021-07-16 ENCOUNTER — Other Ambulatory Visit: Payer: Self-pay | Admitting: Cardiology

## 2021-07-18 ENCOUNTER — Ambulatory Visit: Payer: Medicare Other | Admitting: Cardiology

## 2021-07-20 DIAGNOSIS — D1801 Hemangioma of skin and subcutaneous tissue: Secondary | ICD-10-CM | POA: Diagnosis not present

## 2021-07-20 DIAGNOSIS — L820 Inflamed seborrheic keratosis: Secondary | ICD-10-CM | POA: Diagnosis not present

## 2021-07-20 DIAGNOSIS — L821 Other seborrheic keratosis: Secondary | ICD-10-CM | POA: Diagnosis not present

## 2021-07-20 DIAGNOSIS — L918 Other hypertrophic disorders of the skin: Secondary | ICD-10-CM | POA: Diagnosis not present

## 2021-07-20 DIAGNOSIS — Z85828 Personal history of other malignant neoplasm of skin: Secondary | ICD-10-CM | POA: Diagnosis not present

## 2021-07-20 DIAGNOSIS — D225 Melanocytic nevi of trunk: Secondary | ICD-10-CM | POA: Diagnosis not present

## 2021-07-20 DIAGNOSIS — L570 Actinic keratosis: Secondary | ICD-10-CM | POA: Diagnosis not present

## 2021-08-04 ENCOUNTER — Other Ambulatory Visit: Payer: Self-pay | Admitting: Internal Medicine

## 2021-08-04 ENCOUNTER — Other Ambulatory Visit: Payer: Self-pay | Admitting: Cardiology

## 2021-08-11 ENCOUNTER — Other Ambulatory Visit: Payer: Self-pay | Admitting: Internal Medicine

## 2021-11-09 ENCOUNTER — Other Ambulatory Visit: Payer: Self-pay | Admitting: Internal Medicine

## 2021-11-27 ENCOUNTER — Encounter: Payer: Self-pay | Admitting: Internal Medicine

## 2021-12-03 ENCOUNTER — Other Ambulatory Visit: Payer: Self-pay | Admitting: Cardiology

## 2021-12-03 ENCOUNTER — Other Ambulatory Visit: Payer: Self-pay | Admitting: Internal Medicine

## 2021-12-27 ENCOUNTER — Other Ambulatory Visit: Payer: Self-pay | Admitting: Cardiology

## 2021-12-29 ENCOUNTER — Encounter: Payer: Self-pay | Admitting: Gastroenterology

## 2021-12-31 ENCOUNTER — Other Ambulatory Visit: Payer: Self-pay | Admitting: Cardiology

## 2022-01-02 ENCOUNTER — Other Ambulatory Visit: Payer: Self-pay | Admitting: Internal Medicine

## 2022-01-09 ENCOUNTER — Other Ambulatory Visit: Payer: Self-pay | Admitting: Cardiology

## 2022-01-10 ENCOUNTER — Other Ambulatory Visit: Payer: Self-pay | Admitting: Gastroenterology

## 2022-01-13 ENCOUNTER — Other Ambulatory Visit: Payer: Self-pay | Admitting: Cardiology

## 2022-01-15 MED ORDER — EZETIMIBE 10 MG PO TABS: 10.0000 mg | ORAL_TABLET | Freq: Every day | ORAL | 1 refills | Status: DC

## 2022-01-17 ENCOUNTER — Ambulatory Visit (AMBULATORY_SURGERY_CENTER): Payer: Self-pay

## 2022-01-17 VITALS — Ht 68.0 in | Wt 200.0 lb

## 2022-01-17 DIAGNOSIS — Z860100 Personal history of colon polyps, unspecified: Secondary | ICD-10-CM

## 2022-01-17 DIAGNOSIS — K22719 Barrett's esophagus with dysplasia, unspecified: Secondary | ICD-10-CM

## 2022-01-17 MED ORDER — NA SULFATE-K SULFATE-MG SULF 17.5-3.13-1.6 GM/177ML PO SOLN: 1.0000 | ORAL | 0 refills | Status: DC

## 2022-01-17 NOTE — Progress Notes (Signed)
No egg or soy allergy known to patient  No issues known to pt with past sedation with any surgeries or procedures Patient denies ever being told they had issues or difficulty with intubation  No FH of Malignant Hyperthermia Pt is not on diet pills Pt is not on  home 02  Pt is not on blood thinners  Pt reports occasional issues with constipation  No A fib or A flutter Have any cardiac testing pending--denied Pt instructed to use Singlecare.com or GoodRx for a price reduction on prep  Patient's chart reviewed by Osvaldo Angst CNRA prior to previsit and patient appropriate for the [place].  Previsit completed and red dot placed by patient's name on their procedure day (on provider's schedule).

## 2022-01-24 ENCOUNTER — Other Ambulatory Visit: Payer: Self-pay | Admitting: Internal Medicine

## 2022-01-25 ENCOUNTER — Other Ambulatory Visit: Payer: Self-pay | Admitting: Internal Medicine

## 2022-01-26 ENCOUNTER — Other Ambulatory Visit: Payer: Self-pay | Admitting: Internal Medicine

## 2022-01-26 MED ORDER — METOPROLOL SUCCINATE ER 25 MG PO TB24: 25.0000 mg | ORAL_TABLET | Freq: Every day | ORAL | 0 refills | Status: DC

## 2022-01-29 DIAGNOSIS — D225 Melanocytic nevi of trunk: Secondary | ICD-10-CM | POA: Diagnosis not present

## 2022-01-29 DIAGNOSIS — L821 Other seborrheic keratosis: Secondary | ICD-10-CM | POA: Diagnosis not present

## 2022-01-29 DIAGNOSIS — L570 Actinic keratosis: Secondary | ICD-10-CM | POA: Diagnosis not present

## 2022-01-29 DIAGNOSIS — C44329 Squamous cell carcinoma of skin of other parts of face: Secondary | ICD-10-CM | POA: Diagnosis not present

## 2022-01-29 DIAGNOSIS — Z85828 Personal history of other malignant neoplasm of skin: Secondary | ICD-10-CM | POA: Diagnosis not present

## 2022-01-29 DIAGNOSIS — C44229 Squamous cell carcinoma of skin of left ear and external auricular canal: Secondary | ICD-10-CM | POA: Diagnosis not present

## 2022-01-29 DIAGNOSIS — D485 Neoplasm of uncertain behavior of skin: Secondary | ICD-10-CM | POA: Diagnosis not present

## 2022-01-30 ENCOUNTER — Other Ambulatory Visit: Payer: Self-pay | Admitting: Internal Medicine

## 2022-01-30 ENCOUNTER — Ambulatory Visit: Payer: Medicare Other | Admitting: Internal Medicine

## 2022-01-30 ENCOUNTER — Encounter: Payer: Self-pay | Admitting: Internal Medicine

## 2022-01-30 VITALS — BP 130/82 | HR 80 | Temp 97.9°F | Resp 18 | Ht 68.0 in | Wt 201.2 lb

## 2022-01-30 DIAGNOSIS — I739 Peripheral vascular disease, unspecified: Secondary | ICD-10-CM

## 2022-01-30 DIAGNOSIS — Z79899 Other long term (current) drug therapy: Secondary | ICD-10-CM

## 2022-01-30 DIAGNOSIS — F411 Generalized anxiety disorder: Secondary | ICD-10-CM

## 2022-01-30 DIAGNOSIS — G4700 Insomnia, unspecified: Secondary | ICD-10-CM

## 2022-01-30 DIAGNOSIS — M7712 Lateral epicondylitis, left elbow: Secondary | ICD-10-CM

## 2022-01-30 DIAGNOSIS — I10 Essential (primary) hypertension: Secondary | ICD-10-CM | POA: Diagnosis not present

## 2022-01-30 LAB — BASIC METABOLIC PANEL
BUN: 10 mg/dL (ref 6–23)
CO2: 30 mEq/L (ref 19–32)
Calcium: 9.7 mg/dL (ref 8.4–10.5)
Chloride: 101 mEq/L (ref 96–112)
Creatinine, Ser: 0.94 mg/dL (ref 0.40–1.50)
GFR: 84.15 mL/min (ref >60.00)
Glucose, Bld: 104 mg/dL — ABNORMAL HIGH (ref 70–99)
Potassium: 4.9 mEq/L (ref 3.5–5.1)
Sodium: 139 mEq/L (ref 135–145)

## 2022-01-30 MED ORDER — HYDROCHLOROTHIAZIDE 25 MG PO TABS: 25.0000 mg | ORAL_TABLET | Freq: Every day | ORAL | 1 refills | Status: DC

## 2022-01-30 MED ORDER — LOSARTAN POTASSIUM 25 MG PO TABS: 25.0000 mg | ORAL_TABLET | Freq: Every day | ORAL | 1 refills | Status: DC

## 2022-01-30 MED ORDER — METOPROLOL SUCCINATE ER 50 MG PO TB24: 50.0000 mg | ORAL_TABLET | Freq: Every day | ORAL | 1 refills | Status: DC

## 2022-01-30 MED ORDER — ROSUVASTATIN CALCIUM 20 MG PO TABS: 20.0000 mg | ORAL_TABLET | Freq: Every day | ORAL | 1 refills | Status: DC

## 2022-01-30 NOTE — Telephone Encounter (Signed)
Appt later today.

## 2022-01-30 NOTE — Assessment & Plan Note (Signed)
Tennis elbow: Rec OTC Voltaren gel, brace, call if not gradually better GGY:IRSWNIO good compliance with metoprolol, HCTZ and losartan.  Ambulatory BPs typically 130/85 and sometimes up today 150/93. Plan: - Increase metoprolol XL to 50 mg daily, BMP, check BPs regularly, call if not at goal, see AVS. -Increase physical activity, he is retired, has more time in his hands, recommend 3 hours of exercise weekly. Hyperlipidemia: On Zetia and rosuvastatin.  Well-controlled PVD, nonobstructive CAD: Self stopped aspirin, he was not sure if he needed to take it, recommend to restart aspirin 81 mg.  Does not carry nitroglycerin, not interested on a refill Anxiety: On Xanax, UDS and contract today. Preventive care: Had a COVID flu shot. Colonoscopy scheduled for next month. RTC 06-2022, CPX

## 2022-01-30 NOTE — Patient Instructions (Addendum)
For tennis elbow: Icing is good Get Voltaren gel over-the-counter, applied 3 times a day as needed Also use a tennis elbow brace when you are active. If you are not improving let us know  Go back to 1 aspirin 81 mg daily  Increase metoprolol XL to 50 mg daily.  Other medications the same.  Continue checking your blood pressures BP GOAL is between 110/65 and  135/85. If it is consistently higher or lower, let me know    GO TO THE LAB : Get the blood work     Ryan Smith, [name] back for   physical exam by 06-2022

## 2022-01-30 NOTE — Progress Notes (Signed)
Subjective:    Patient ID: Ryan Smith, male    DOB: 1955/06/01, 66 y.o.   MRN: [id number]  DOS:  01/30/2022 Type of visit - description: Follow-up  We addressed his chronic medical problems Also has developed left elbow pain after he mowed the yard few weeks ago. No injury, no swelling, the epicondyle hurts with certain movements.   BP Readings from Last 3 Encounters:  01/30/22 130/82  06/22/21 140/78  03/13/21 140/88     Review of Systems See above   Past Medical History:  Diagnosis Date   Abnormal LFTs    2008 neg. hep. serology, (-) ANA, ceruloplasmin, etc.;  liver biopsy showed  minimal active inflammation   Abnormal LFTs (liver function tests) 08/15/2010   increased LFTs: 2008 neg. hep. serology, (-) ANA, ceruloplasmin, etc.; liver biopsy showed minimal active inflammation     Anxiety    Anxiety state 08/30/2006   Qualifier: Diagnosis of  By: Carley Hammed      Atherosclerosis of native arteries of the extremities with intermittent claudication 11/30/2013   Barrett's esophagus    BCC (basal cell carcinoma of skin)    Cerumen impaction 03/03/2014   Claudication ([place]) 11/18/2013   COMMON MIGRAINE 08/30/2006   Qualifier: Diagnosis of  By: Carley Hammed     Diverticulosis 08/15/2010   Elevated PSA    Esophageal reflux    GERD (gastroesophageal reflux disease) 08/15/2010   Hearing loss    has hearing aids   Hiatal hernia    HIATAL HERNIA WITH REFLUX 12/21/2009   Qualifier: Diagnosis of  By: Nils Pyle CMA ([place]), [place]     History of diverticulitis of colon    Hx of adenomatous colonic polyps 08/15/2010   Hyperlipidemia 10/03/2011   Hypertension, essential 07/22/2019   INSOMNIA 08/30/2006   Qualifier: Diagnosis of  By: Carley Hammed     LOSS, HEARING NOS 08/30/2006   Qualifier: Diagnosis of  By: Carley Hammed     Other specified disorders of liver 11/15/2009   Qualifier: Diagnosis of  Problem Stop Reason:  By: Sharlett Iles MD Byrd Hesselbach    Personal history of  colonic polyps    tublar adenomas 2008 & hyerplastic 2011   Preop cardiovascular exam 01/19/2014   PVD (peripheral vascular disease) ([place])    PVD (peripheral vascular disease) with claudication ([place]) 01/04/2014   SCC (squamous cell carcinoma)    L shoulder   Tobacco use 11/24/2013    Past Surgical History:  Procedure Laterality Date   ABDOMINAL AORTAGRAM N/A 11/25/2013   Procedure: ABDOMINAL Maxcine Ham;  Surgeon: Wellington Hampshire, MD;  Location: [facility] CATH LAB;  Service: Cardiovascular;  Laterality: N/A;   COLONOSCOPY  02/07/2016   Laparoscopically-assisted Sigmoid Colectomy  11/10/2007   LIVER BIOPSY     minimal active inflammation   UPPER GASTROINTESTINAL ENDOSCOPY  02/07/2016    Current Outpatient Medications  Medication Instructions   ALPRAZolam (XANAX) 0.5 mg, Oral, 2 times daily   aspirin EC 81 mg, Daily   Coenzyme Q10 10 mg, Daily   D-Limonene 1 g, Oral, As needed   dexlansoprazole (DEXILANT) 60 mg, Oral, Daily   ezetimibe (ZETIA) 10 mg, Oral, Daily, Needs appointment for future refill / 3rd attempt   fluticasone (FLONASE) 50 MCG/ACT nasal spray 2 sprays, Each Nare, Daily   hydrochlorothiazide (HYDRODIURIL) 25 MG tablet TAKE 1 TABLET EVERY DAY   losartan (COZAAR) 25 mg, Oral, Daily   Magnesium 500 MG CAPS 1 capsule, Oral, Daily at bedtime,     metoprolol  succinate (TOPROL-XL) 25 mg, Oral, Daily, Needs appointment for future refills   Na Sulfate-K Sulfate-Mg Sulf 17.5-3.13-1.6 GM/177ML SOLN 1 kit, Oral, As directed, May use generic Suprep, no prior authorization. Take as directed.   nitroGLYCERIN (NITROSTAT) 0.4 mg, Sublingual, Every 5 min PRN   psyllium (METAMUCIL) 58.6 % powder 1 packet, Oral, Daily at bedtime   rosuvastatin (CRESTOR) 20 MG tablet TAKE 1 TABLET BY MOUTH EVERY DAY       Objective:   Physical Exam BP 130/82   Pulse 80   Temp 97.9 F (36.6 C) (Oral)   Resp 18   Ht _0  (1.727 m)   Wt 201 lb 4 oz (91.3 kg)   SpO2 93%   BMI 30.60 kg/m  General:    Well developed, NAD, BMI noted. HEENT:  Normocephalic . Face symmetric, atraumatic Lungs:  CTA B Normal respiratory effort, no intercostal retractions, no accessory muscle use. Heart: RRR,  no murmur.  Upper extremities: Both elbows are normal to inspection and palpation.  Slightly TTP and the external epicondyle L Skin: Not pale. Not jaundice Neurologic:  alert & oriented X3.  Speech normal, gait appropriate for age and unassisted Psych--  Cognition and judgment appear intact.  Cooperative with normal attention span and concentration.  Behavior appropriate. No anxious or depressed appearing.      Assessment     Assessment HTN: dx 07/2019 (intolerant amlodipine, carvedilol, see OV 09/07/2019) Anxiety Hyperlipidemia  PVD R leg, dx 2015,   claudication 10-2013, ABIs showed obstruction, saw [name], had a aortogram , was referred to surgery. They rx a CT chest show mild atherosclerosis disease of the aorta, coronary arthrosclerosis. Had a MRI of the right leg, see report. Was recommended a bypass but sx  improved, did not proceed. No benefit from cilostazol Nonobstructive CAD -CT coronary morphology  10-2019:Mild to moderate, likely non-obstructive mixed CAD predominantly in the LAD,  GI:  --Barrett's, GERD, last EGD,BX : 02-2016  --Abnormal LFTs: 2008 neg. hep. serology, (-) ANA, ceruloplasmin, etc.;  liver bx:  minimal active inflammation Hearing loss, has aids  Sees dermatology  as off 05-2021 Stress 12-2013: Low risk Prostate BX 12-2016 (-)  PLAN: Tennis elbow: Rec OTC Voltaren gel, brace, call if not gradually better [id number] good compliance with metoprolol, HCTZ and losartan.  Ambulatory BPs typically 130/85 and sometimes up today 150/93. Plan: - Increase metoprolol XL to 50 mg daily, BMP, check BPs regularly, call if not at goal, see AVS. -Increase physical activity, he is retired, has more time in his hands, recommend 3 hours of exercise weekly. Hyperlipidemia: On  Zetia and rosuvastatin.  Well-controlled PVD, nonobstructive CAD: Self stopped aspirin, he was not sure if he needed to take it, recommend to restart aspirin 81 mg.  Does not carry nitroglycerin, not interested on a refill Anxiety: On Xanax, UDS and contract today. Preventive care: Had a COVID flu shot. Colonoscopy scheduled for next month. RTC 06-2022, CPX

## 2022-02-01 LAB — DRUG MONITORING PANEL 375977 , URINE
Alcohol Metabolites: POSITIVE ng/mL — AB (ref <500)
Alphahydroxyalprazolam: 80 ng/mL — ABNORMAL HIGH (ref <25)
Alphahydroxymidazolam: NEGATIVE ng/mL (ref <50)
Alphahydroxytriazolam: NEGATIVE ng/mL (ref <50)
Aminoclonazepam: NEGATIVE ng/mL (ref <25)
Amphetamines: NEGATIVE ng/mL (ref <500)
Barbiturates: NEGATIVE ng/mL (ref <300)
Benzodiazepines: POSITIVE ng/mL — AB (ref <100)
Cocaine Metabolite: NEGATIVE ng/mL (ref <150)
Desmethyltramadol: NEGATIVE ng/mL (ref <100)
Ethyl Glucuronide (ETG): 1560 ng/mL — ABNORMAL HIGH (ref <500)
Ethyl Sulfate (ETS): 341 ng/mL — ABNORMAL HIGH (ref <100)
Hydroxyethylflurazepam: NEGATIVE ng/mL (ref <50)
Lorazepam: NEGATIVE ng/mL (ref <50)
Marijuana Metabolite: NEGATIVE ng/mL (ref <20)
Nordiazepam: NEGATIVE ng/mL (ref <50)
Opiates: NEGATIVE ng/mL (ref <100)
Oxazepam: NEGATIVE ng/mL (ref <50)
Oxycodone: NEGATIVE ng/mL (ref <100)
Temazepam: NEGATIVE ng/mL (ref <50)
Tramadol: NEGATIVE ng/mL (ref <100)

## 2022-02-01 LAB — DM TEMPLATE

## 2022-02-07 ENCOUNTER — Encounter: Payer: Medicare Other | Admitting: Gastroenterology

## 2022-02-07 ENCOUNTER — Other Ambulatory Visit: Payer: Self-pay | Admitting: Cardiology

## 2022-02-12 ENCOUNTER — Other Ambulatory Visit: Payer: Self-pay | Admitting: Cardiology

## 2022-02-12 ENCOUNTER — Encounter: Payer: Self-pay | Admitting: Internal Medicine

## 2022-02-12 MED ORDER — EZETIMIBE 10 MG PO TABS: 10.0000 mg | ORAL_TABLET | Freq: Every day | ORAL | 1 refills | Status: DC

## 2022-02-15 DIAGNOSIS — C44229 Squamous cell carcinoma of skin of left ear and external auricular canal: Secondary | ICD-10-CM | POA: Diagnosis not present

## 2022-02-24 ENCOUNTER — Other Ambulatory Visit: Payer: Self-pay | Admitting: Gastroenterology

## 2022-02-25 ENCOUNTER — Encounter: Payer: Self-pay | Admitting: Certified Registered Nurse Anesthetist

## 2022-02-27 ENCOUNTER — Encounter: Payer: Self-pay | Admitting: Gastroenterology

## 2022-03-01 ENCOUNTER — Ambulatory Visit (AMBULATORY_SURGERY_CENTER): Payer: Medicare Other | Admitting: Gastroenterology

## 2022-03-01 ENCOUNTER — Encounter: Payer: Self-pay | Admitting: Gastroenterology

## 2022-03-01 VITALS — BP 122/86 | HR 62 | Temp 98.2°F | Resp 14 | Ht 68.0 in | Wt 200.0 lb

## 2022-03-01 DIAGNOSIS — K2270 Barrett's esophagus without dysplasia: Secondary | ICD-10-CM | POA: Diagnosis not present

## 2022-03-01 DIAGNOSIS — Z860100 Personal history of colon polyps, unspecified: Secondary | ICD-10-CM

## 2022-03-01 DIAGNOSIS — D123 Benign neoplasm of transverse colon: Secondary | ICD-10-CM | POA: Diagnosis not present

## 2022-03-01 DIAGNOSIS — Z09 Encounter for follow-up examination after completed treatment for conditions other than malignant neoplasm: Secondary | ICD-10-CM | POA: Diagnosis not present

## 2022-03-01 MED ORDER — SODIUM CHLORIDE 0.9 % IV SOLN: 500.0000 mL | Freq: Once | INTRAVENOUS | Status: DC

## 2022-03-01 NOTE — Op Note (Signed)
[place] Patient Name: Ryan Smith Procedure Date: 03/01/2022 1:29 PM MRN: [id number] Endoscopist: Ladene Artist , MD, [id number] Age: 66 Referring MD:  Date of Birth: 06-10-1955 Gender: Male Account #: [id number] Procedure:                Upper GI endoscopy Indications:              Surveillance for malignancy due to personal history                            of Barrett's esophagus Medicines:                Monitored Anesthesia Care Procedure:                Pre-Anesthesia Assessment:                           - Prior to the procedure, a History and Physical                            was performed, and patient medications and                            allergies were reviewed. The patient's tolerance of                            previous anesthesia was also reviewed. The risks                            and benefits of the procedure and the sedation                            options and risks were discussed with the patient.                            All questions were answered, and informed consent                            was obtained. Prior Anticoagulants: The patient has                            taken no anticoagulant or antiplatelet agents. ASA                            Grade Assessment: III - A patient with severe                            systemic disease. After reviewing the risks and                            benefits, the patient was deemed in satisfactory                            condition to undergo the procedure.  After obtaining informed consent, the endoscope was                            passed under direct vision. Throughout the                            procedure, the patient's blood pressure, pulse, and                            oxygen saturations were monitored continuously. The                            Endoscope was introduced through the mouth, and                            advanced to the second part  of duodenum. The upper                            GI endoscopy was accomplished without difficulty.                            The patient tolerated the procedure well. Scope In: Scope Out: Findings:                 There were esophageal mucosal changes secondary to                            established short-segment Barrett's disease present                            in the distal esophagus. The maximum longitudinal                            extent of these mucosal changes was 2 cm in length.                            Mucosa was biopsied with a cold forceps for                            histology in a targeted manner at intervals of 1 cm                            in the lower third of the esophagus. One specimen                            bottle was sent to pathology.                           The exam of the esophagus was otherwise normal.                           A small hiatal hernia was present.  Multiple small sessile polyps with no bleeding and                            no stigmata of recent bleeding were found in the                            gastric fundus and in the gastric body.                           The exam of the stomach was otherwise normal.                           The duodenal bulb and second portion of the                            duodenum were normal. Complications:            No immediate complications. Estimated Blood Loss:     Estimated blood loss was minimal. Impression:               - Esophageal mucosal changes secondary to                            established short-segment Barrett's disease.                            Biopsied.                           - Small hiatal hernia.                           - Multiple gastric polyps.                           - Normal duodenal bulb and second portion of the                            duodenum. Recommendation:           - Patient has a contact number available for                             emergencies. The signs and symptoms of potential                            delayed complications were discussed with the                            patient. Return to normal activities tomorrow.                            Written discharge instructions were provided to the                            patient.                           -  Resume previous diet.                           - Follow antireflux measures.                           - Continue present medications.                           - Await pathology results.                           - Repeat upper endoscopy in 3-5 years for                            surveillance based on pathology results. Ladene Artist, MD 03/01/2022 2:01:59 PM This report has been signed electronically.

## 2022-03-01 NOTE — Op Note (Signed)
[place] Patient Name: Ryan Smith Procedure Date: 03/01/2022 1:30 PM MRN: [id number] Endoscopist: Ladene Artist , MD, [id number] Age: 66 Referring MD:  Date of Birth: [birth year] Gender: Male Account #: [id number] Procedure:                Colonoscopy Indications:              Surveillance: Personal history of adenomatous                            polyps on last colonoscopy > 5 years ago Medicines:                Monitored Anesthesia Care Procedure:                Pre-Anesthesia Assessment:                           - Prior to the procedure, a History and Physical                            was performed, and patient medications and                            allergies were reviewed. The patient's tolerance of                            previous anesthesia was also reviewed. The risks                            and benefits of the procedure and the sedation                            options and risks were discussed with the patient.                            All questions were answered, and informed consent                            was obtained. Prior Anticoagulants: The patient has                            taken no anticoagulant or antiplatelet agents. ASA                            Grade Assessment: III - A patient with severe                            systemic disease. After reviewing the risks and                            benefits, the patient was deemed in satisfactory                            condition to undergo the procedure.  After obtaining informed consent, the colonoscope                            was passed under direct vision. Throughout the                            procedure, the patient's blood pressure, pulse, and                            oxygen saturations were monitored continuously. The                            CF [id number] #[id number] was introduced through the anus                            and advanced to  the the cecum, identified by                            appendiceal orifice and ileocecal valve. The                            ileocecal valve, appendiceal orifice, and rectum                            were photographed. The quality of the bowel                            preparation was adequate after substantial lavage,                            suction. The colonoscopy was performed without                            difficulty. The patient tolerated the procedure                            well. Scope In: 1:33:40 PM Scope Out: 1:44:59 PM Scope Withdrawal Time: 0 hours 10 minutes 13 seconds  Total Procedure Duration: 0 hours 11 minutes 19 seconds  Findings:                 The perianal and digital rectal examinations were                            normal.                           Two sessile polyps were found in the transverse                            colon. The polyps were 8 mm in size. These polyps                            were removed with a cold snare. Resection and  retrieval were complete.                           There was evidence of a prior end-to-end                            colo-colonic anastomosis in the sigmoid colon. This                            was patent and was characterized by healthy                            appearing mucosa. The anastomosis was traversed.                           A few medium-mouthed diverticula were found in the                            left colon.                           Internal hemorrhoids were found during                            retroflexion. The hemorrhoids were small and Grade                            I (internal hemorrhoids that do not prolapse).                           The exam was otherwise without abnormality on                            direct and retroflexion views. Complications:            No immediate complications. Estimated blood loss:                             None. Estimated Blood Loss:     Estimated blood loss: none. Impression:               - Two 8 mm polyps in the transverse colon, removed                            with a cold snare. Resected and retrieved.                           - Patent end-to-end colo-colonic anastomosis,                            characterized by healthy appearing mucosa.                           - Mild diverticulosis in the left colon.                           -  Internal hemorrhoids.                           - The examination was otherwise normal on direct                            and retroflexion views. Recommendation:           - Repeat colonoscopy after studies are complete for                            surveillance based on pathology results with a more                            extensive bowel prep.                           - Patient has a contact number available for                            emergencies. The signs and symptoms of potential                            delayed complications were discussed with the                            patient. Return to normal activities tomorrow.                            Written discharge instructions were provided to the                            patient.                           - Resume previous diet.                           - Continue present medications.                           - Await pathology results. Ladene Artist, MD 03/01/2022 1:56:00 PM This report has been signed electronically.

## 2022-03-01 NOTE — Progress Notes (Signed)
Pt's states no medical or surgical changes since previsit or office visit. 

## 2022-03-01 NOTE — Patient Instructions (Signed)
Please read handouts provided. Continue present medications. Await pathology results. Follow antireflux measures.   YOU HAD AN ENDOSCOPIC PROCEDURE TODAY AT [facility]:   Refer to the procedure report that was given to you for any specific questions about what was found during the examination.  If the procedure report does not answer your questions, please call your gastroenterologist to clarify.  If you requested that your care partner not be given the details of your procedure findings, then the procedure report has been included in a sealed envelope for you to review at your convenience later.  YOU SHOULD EXPECT: Some feelings of bloating in the abdomen. Passage of more gas than usual.  Walking can help get rid of the air that was put into your GI tract during the procedure and reduce the bloating. If you had a lower endoscopy (such as a colonoscopy or flexible sigmoidoscopy) you may notice spotting of blood in your stool or on the toilet paper. If you underwent a bowel prep for your procedure, you may not have a normal bowel movement for a few days.  Please Note:  You might notice some irritation and congestion in your nose or some drainage.  This is from the oxygen used during your procedure.  There is no need for concern and it should clear up in a day or so.  SYMPTOMS TO REPORT IMMEDIATELY:  Following lower endoscopy (colonoscopy or flexible sigmoidoscopy):  Excessive amounts of blood in the stool  Significant tenderness or worsening of abdominal pains  Swelling of the abdomen that is new, acute  Fever of 100F or higher  Following upper endoscopy (EGD)  Vomiting of blood or coffee ground material  New chest pain or pain under the shoulder blades  Painful or persistently difficult swallowing  New shortness of breath  Fever of 100F or higher  Black, tarry-looking stools  For urgent or emergent issues, a gastroenterologist can be reached at any hour by calling  [phone]. Do not use MyChart messaging for urgent concerns.    DIET:  We do recommend a small meal at first, but then you may proceed to your regular diet.  Drink plenty of fluids but you should avoid alcoholic beverages for 24 hours.  ACTIVITY:  You should plan to take it easy for the rest of today and you should NOT DRIVE or use heavy machinery until tomorrow (because of the sedation medicines used during the test).    FOLLOW UP: Our staff will call the number listed on your records the next business day following your procedure.  We will call around 7:15- 8:00 am to check on you and address any questions or concerns that you may have regarding the information given to you following your procedure. If we do not reach you, we will leave a message.     If any biopsies were taken you will be contacted by phone or by letter within the next 1-3 weeks.  Please call us at [phone] if you have not heard about the biopsies in 3 weeks.    SIGNATURES/CONFIDENTIALITY: You and/or your care partner have signed paperwork which will be entered into your electronic medical record.  These signatures attest to the fact that that the information above on your After Visit Summary has been reviewed and is understood.  Full responsibility of the confidentiality of this discharge information lies with you and/or your care-partner.

## 2022-03-01 NOTE — Progress Notes (Signed)
Called to room to assist during endoscopic procedure.  Patient ID and intended procedure confirmed with present staff. Received instructions for my participation in the procedure from the performing physician.  

## 2022-03-01 NOTE — Progress Notes (Signed)
History & Physical  Primary Care Physician:  Colon Branch, MD Primary Gastroenterologist: Lucio Edward, MD  CHIEF COMPLAINT: Barrett's esophagus, Personal history of colon polyps   HPI: Ryan Smith is a 66 y.o. male with a personal history of adenomatous colon polyps and short segment Barrett's esophagus without dysplasia for colonoscopy and EGD.    Past Medical History:  Diagnosis Date   Abnormal LFTs    2008 neg. hep. serology, (-) ANA, ceruloplasmin, etc.;  liver biopsy showed  minimal active inflammation   Abnormal LFTs (liver function tests) 08/15/2010   increased LFTs: 2008 neg. hep. serology, (-) ANA, ceruloplasmin, etc.; liver biopsy showed minimal active inflammation     Anxiety    Anxiety state 08/30/2006   Qualifier: Diagnosis of  By: Carley Hammed      Atherosclerosis of native arteries of the extremities with intermittent claudication 11/30/2013   Barrett's esophagus    BCC (basal cell carcinoma of skin)    Cerumen impaction 03/03/2014   Claudication ([place]) 11/18/2013   COMMON MIGRAINE 08/30/2006   Qualifier: Diagnosis of  By: Carley Hammed     Diverticulosis 08/15/2010   Elevated PSA    Esophageal reflux    GERD (gastroesophageal reflux disease) 08/15/2010   Hearing loss    has hearing aids   Hiatal hernia    HIATAL HERNIA WITH REFLUX 12/21/2009   Qualifier: Diagnosis of  By: Nils Pyle CMA ([place]), Leisha     History of diverticulitis of colon    Hx of adenomatous colonic polyps 08/15/2010   Hyperlipidemia 10/03/2011   Hypertension, essential 07/22/2019   INSOMNIA 08/30/2006   Qualifier: Diagnosis of  By: Carley Hammed     LOSS, HEARING NOS 08/30/2006   Qualifier: Diagnosis of  By: Carley Hammed     Other specified disorders of liver 11/15/2009   Qualifier: Diagnosis of  Problem Stop Reason:  By: Sharlett Iles MD Byrd Hesselbach    Personal history of colonic polyps    tublar adenomas 2008 & hyerplastic 2011   Preop cardiovascular exam 01/19/2014   PVD (peripheral  vascular disease) ([place])    PVD (peripheral vascular disease) with claudication ([place]) 01/04/2014   SCC (squamous cell carcinoma)    L shoulder   Tobacco use 11/24/2013    Past Surgical History:  Procedure Laterality Date   ABDOMINAL AORTAGRAM N/A 11/25/2013   Procedure: ABDOMINAL Maxcine Ham;  Surgeon: Wellington Hampshire, MD;  Location: [facility];  Service: Cardiovascular;  Laterality: N/A;   COLONOSCOPY  02/07/2016   Laparoscopically-assisted Sigmoid Colectomy  11/10/2007   LIVER BIOPSY     minimal active inflammation   UPPER GASTROINTESTINAL ENDOSCOPY  02/07/2016    Prior to Admission medications   Medication Sig Start Date End Date Taking? Authorizing Provider  ALPRAZolam (XANAX) 0.5 MG tablet TAKE 1 TABLET BY MOUTH 2 TIMES DAILY. 12/03/21  Yes Dutch Quint B, FNP  Coenzyme Q10 10 MG capsule Take 10 mg by mouth daily.   Yes [provider]  dexlansoprazole (DEXILANT) 60 MG capsule Take 1 capsule (60 mg total) by mouth daily. 01/10/21  Yes Ladene Artist, MD  ezetimibe (ZETIA) 10 MG tablet Take 1 tablet (10 mg total) by mouth daily. 02/12/22  Yes Paz, Alda Berthold, MD  hydrochlorothiazide (HYDRODIURIL) 25 MG tablet Take 1 tablet (25 mg total) by mouth daily. 01/30/22  Yes Paz, Alda Berthold, MD  losartan (COZAAR) 25 MG tablet Take 1 tablet (25 mg total) by mouth daily. 01/30/22  Yes Colon Branch, MD  Magnesium 500 MG CAPS Take 1 capsule by mouth at bedtime.   Yes [provider]  metoprolol succinate (TOPROL-XL) 50 MG 24 hr tablet Take 1 tablet (50 mg total) by mouth daily. Take with or immediately following a meal. 01/30/22  Yes Paz, Alda Berthold, MD  [facility] (D-LIMONENE) 1 g CAPS Take 1 g by mouth as needed.    Yes [provider]  psyllium (METAMUCIL) 58.6 % powder Take 1 packet by mouth at bedtime.   Yes [provider]  rosuvastatin (CRESTOR) 20 MG tablet Take 1 tablet (20 mg total) by mouth daily. 01/30/22  Yes Colon Branch, MD  aspirin EC 81 MG tablet Take 81 mg  by mouth daily. Patient not taking: Reported on 01/17/2022    [provider]    Current Outpatient Medications  Medication Sig Dispense Refill   ALPRAZolam (XANAX) 0.5 MG tablet TAKE 1 TABLET BY MOUTH 2 TIMES DAILY. 60 tablet 4   Coenzyme Q10 10 MG capsule Take 10 mg by mouth daily.     dexlansoprazole (DEXILANT) 60 MG capsule Take 1 capsule (60 mg total) by mouth daily. 90 capsule 3   ezetimibe (ZETIA) 10 MG tablet Take 1 tablet (10 mg total) by mouth daily. 90 tablet 1   hydrochlorothiazide (HYDRODIURIL) 25 MG tablet Take 1 tablet (25 mg total) by mouth daily. 90 tablet 1   losartan (COZAAR) 25 MG tablet Take 1 tablet (25 mg total) by mouth daily. 90 tablet 1   Magnesium 500 MG CAPS Take 1 capsule by mouth at bedtime.     metoprolol succinate (TOPROL-XL) 50 MG 24 hr tablet Take 1 tablet (50 mg total) by mouth daily. Take with or immediately following a meal. 90 tablet 1   [place] (D-LIMONENE) 1 g CAPS Take 1 g by mouth as needed.      psyllium (METAMUCIL) 58.6 % powder Take 1 packet by mouth at bedtime.     rosuvastatin (CRESTOR) 20 MG tablet Take 1 tablet (20 mg total) by mouth daily. 90 tablet 1   aspirin EC 81 MG tablet Take 81 mg by mouth daily. (Patient not taking: Reported on 01/17/2022)     Current Facility-Administered Medications  Medication Dose Route Frequency Provider Last Rate Last Admin   0.9 %  sodium chloride infusion  500 mL Intravenous Once Ladene Artist, MD        Allergies as of 03/01/2022   (No Known Allergies)    Family History  Problem Relation Age of Onset   Prostate cancer Father 51   Esophageal cancer Father    Coronary artery disease Father        dx age 80, CABG   Heart disease Father    Heart disease Paternal Uncle        x 2   Diverticulitis Mother    Colon cancer Neg Hx    Diabetes Neg Hx    Rectal cancer Neg Hx    Stomach cancer Neg Hx    Colon polyps Neg Hx     Social History   Socioeconomic History   Marital status:  Married    Spouse name: Not on file   Number of children: 2   Years of education: Not on file   Highest education level: Not on file  Occupational History   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, music, [employer]    Employer: [employer]  Tobacco Use   Smoking status: Former    Years: 40.00  Types: Cigarettes    Quit date: 02/26/2019    Years since quitting: 3.0   Smokeless tobacco: Never   Tobacco comments:       Vaping Use   Vaping Use: Never used  Substance and Sexual Activity   Alcohol use: Yes    Alcohol/week: 20.0 standard drinks of alcohol    Types: 20 Glasses of wine per week    Comment: 3-4 glasses of wine qhs   Drug use: No   Sexual activity: Not on file  Other Topics Concern   Not on file  Social History Narrative   Born in [place]   Married, 2 step sons (adults)   Social Determinants of Health   Financial Resource Strain: Not on file  Food Insecurity: Not on file  Transportation Needs: Not on file  Physical Activity: Not on file  Stress: Not on file  Social Connections: Not on file  Intimate Partner Violence: Not on file    Review of Systems:  All systems reviewed were negative except where noted in HPI.   Physical Exam: General:  Alert, well-developed, in NAD Head:  Normocephalic and atraumatic. Eyes:  Sclera clear, no icterus.   Conjunctiva pink. Ears:  Normal auditory acuity. Mouth:  No deformity or lesions.  Neck:  Supple; no masses . Lungs:  Clear throughout to auscultation.   No wheezes, crackles, or rhonchi. No acute distress. Heart:  Regular rate and rhythm; no murmurs. Abdomen:  Soft, nondistended, nontender. No masses, hepatomegaly. No obvious masses.  Normal bowel .    Rectal:  Deferred   Msk:  Symmetrical without gross deformities.. Pulses:  Normal pulses noted. Extremities:  Without edema. Neurologic:  Alert and  oriented x4;  grossly normal neurologically. Skin:  Intact without significant lesions or rashes. Psych:   Alert and cooperative. Normal mood and affect.   Impression / Plan:   Personal history of adenomatous colon polyps and short segment Barrett's esophagus without dysplasia for colonoscopy and EGD.  Pricilla Riffle. Fuller Plan  03/01/2022, 1:30 PM See Shea Evans, [facility], to contact our on call provider

## 2022-03-01 NOTE — Progress Notes (Signed)
Report given to PACU, vss 

## 2022-03-01 NOTE — Progress Notes (Signed)
1255 Robinul 0.1 mg IV given due large amount of secretions upon assessment.  MD made aware, vss

## 2022-03-02 ENCOUNTER — Telehealth: Payer: Self-pay

## 2022-03-02 ENCOUNTER — Other Ambulatory Visit: Payer: Self-pay | Admitting: Gastroenterology

## 2022-03-02 NOTE — Telephone Encounter (Signed)
Left message on follow up call. 

## 2022-03-03 ENCOUNTER — Other Ambulatory Visit: Payer: Self-pay | Admitting: Gastroenterology

## 2022-03-14 ENCOUNTER — Encounter: Payer: Self-pay | Admitting: Gastroenterology

## 2022-04-02 ENCOUNTER — Other Ambulatory Visit: Payer: Self-pay | Admitting: Internal Medicine

## 2022-05-02 ENCOUNTER — Ambulatory Visit (INDEPENDENT_AMBULATORY_CARE_PROVIDER_SITE_OTHER): Payer: Medicare Other

## 2022-05-02 VITALS — Wt 200.0 lb

## 2022-05-02 DIAGNOSIS — Z0000 Encounter for general adult medical examination without abnormal findings: Secondary | ICD-10-CM

## 2022-05-02 NOTE — Patient Instructions (Signed)
Mr. Ryan Smith , Thank you for taking time to come for your Medicare Wellness Visit. I appreciate your ongoing commitment to your health goals. Please review the following plan we discussed and let me know if I can assist you in the future.   These are the goals we discussed:  Goals      lose weight        This is a list of the screening recommended for you and due dates:  Health Maintenance  Topic Date Due   Zoster (Shingles) Vaccine (1 of 2) Never done   Pneumonia Vaccine (2 - PCV) 01/10/2020   COVID-19 Vaccine (6 - 2023-24 season) 03/19/2022   Medicare Annual Wellness Visit  05/03/2023   DTaP/Tdap/Td vaccine (3 - Td or Tdap) 06/20/2026   Colon Cancer Screening  03/01/2029   Flu Shot  Completed   Hepatitis C Screening: USPSTF Recommendation to screen - Ages 18-79 yo.  Completed   HPV Vaccine  Aged Out    Advanced directives: Advance directive discussed with you today. Even though you declined this today please call our office should you change your mind and we can give you the proper paperwork for you to fill out.  Conditions/risks identified: lose some weight   Next appointment: Follow up in one year for your annual wellness visit.   Preventive Care 68 Years and Older, Male  Preventive care refers to lifestyle choices and visits with your health care provider that can promote health and wellness. What does preventive care include? A yearly physical exam. This is also called an annual well check. Dental exams once or twice a year. Routine eye exams. Ask your health care provider how often you should have your eyes checked. Personal lifestyle choices, including: Daily care of your teeth and gums. Regular physical activity. Eating a healthy diet. Avoiding tobacco and drug use. Limiting alcohol use. Practicing safe sex. Taking low doses of aspirin every day. Taking vitamin and mineral supplements as recommended by your health care provider. What happens during an annual well  check? The services and screenings done by your health care provider during your annual well check will depend on your age, overall health, lifestyle risk factors, and family history of disease. Counseling  Your health care provider may ask you questions about your: Alcohol use. Tobacco use. Drug use. Emotional well-being. Home and relationship well-being. Sexual activity. Eating habits. History of falls. Memory and ability to understand (cognition). Work and work Statistician. Screening  You may have the following tests or measurements: Height, weight, and BMI. Blood pressure. Lipid and cholesterol levels. These may be checked every 5 years, or more frequently if you are over 64 years old. Skin check. Lung cancer screening. You may have this screening every year starting at age 68 if you have a 30-pack-year history of smoking and currently smoke or have quit within the past 15 years. Fecal occult blood test (FOBT) of the stool. You may have this test every year starting at age 68. Flexible sigmoidoscopy or colonoscopy. You may have a sigmoidoscopy every 5 years or a colonoscopy every 10 years starting at age 68. Prostate cancer screening. Recommendations will vary depending on your family history and other risks. Hepatitis C blood test. Hepatitis B blood test. Sexually transmitted disease (STD) testing. Diabetes screening. This is done by checking your blood sugar (glucose) after you have not eaten for a while (fasting). You may have this done every 1-3 years. Abdominal aortic aneurysm (AAA) screening. You may need this if  you are a current or former smoker. Osteoporosis. You may be screened starting at age 50 if you are at high risk. Talk with your health care provider about your test results, treatment options, and if necessary, the need for more tests. Vaccines  Your health care provider may recommend certain vaccines, such as: Influenza vaccine. This is recommended every  year. Tetanus, diphtheria, and acellular pertussis (Tdap, Td) vaccine. You may need a Td booster every 10 years. Zoster vaccine. You may need this after age 61. Pneumococcal 13-valent conjugate (PCV13) vaccine. One dose is recommended after age 68. Pneumococcal polysaccharide (PPSV23) vaccine. One dose is recommended after age 68. Talk to your health care provider about which screenings and vaccines you need and how often you need them. This information is not intended to replace advice given to you by your health care provider. Make sure you discuss any questions you have with your health care provider. Document Released: 04/15/2015 Document Revised: 12/07/2015 Document Reviewed: 01/18/2015 Elsevier Interactive Patient Education  2017 [place] Prevention in the Home Falls can cause injuries. They can happen to people of all ages. There are many things you can do to make your home safe and to help prevent falls. What can I do on the outside of my home? Regularly fix the edges of walkways and driveways and fix any cracks. Remove anything that might make you trip as you walk through a door, such as a raised step or threshold. Trim any bushes or trees on the path to your home. Use bright outdoor lighting. Clear any walking paths of anything that might make someone trip, such as rocks or tools. Regularly check to see if handrails are loose or broken. Make sure that both sides of any steps have handrails. Any raised decks and porches should have guardrails on the edges. Have any leaves, snow, or ice cleared regularly. Use sand or salt on walking paths during winter. Clean up any spills in your garage right away. This includes oil or grease spills. What can I do in the bathroom? Use night lights. Install grab bars by the toilet and in the tub and shower. Do not use towel bars as grab bars. Use non-skid mats or decals in the tub or shower. If you need to sit down in the shower, use a  plastic, non-slip stool. Keep the floor dry. Clean up any water that spills on the floor as soon as it happens. Remove soap buildup in the tub or shower regularly. Attach bath mats securely with double-sided non-slip rug tape. Do not have throw rugs and other things on the floor that can make you trip. What can I do in the bedroom? Use night lights. Make sure that you have a light by your bed that is easy to reach. Do not use any sheets or blankets that are too big for your bed. They should not hang down onto the floor. Have a firm chair that has side arms. You can use this for support while you get dressed. Do not have throw rugs and other things on the floor that can make you trip. What can I do in the kitchen? Clean up any spills right away. Avoid walking on wet floors. Keep items that you use a lot in easy-to-reach places. If you need to reach something above you, use a strong step stool that has a grab bar. Keep electrical cords out of the way. Do not use floor polish or wax that makes floors slippery. If  you must use wax, use non-skid floor wax. Do not have throw rugs and other things on the floor that can make you trip. What can I do with my stairs? Do not leave any items on the stairs. Make sure that there are handrails on both sides of the stairs and use them. Fix handrails that are broken or loose. Make sure that handrails are as long as the stairways. Check any carpeting to make sure that it is firmly attached to the stairs. Fix any carpet that is loose or worn. Avoid having throw rugs at the top or bottom of the stairs. If you do have throw rugs, attach them to the floor with carpet tape. Make sure that you have a light switch at the top of the stairs and the bottom of the stairs. If you do not have them, ask someone to add them for you. What else can I do to help prevent falls? Wear shoes that: Do not have high heels. Have rubber bottoms. Are comfortable and fit you  well. Are closed at the toe. Do not wear sandals. If you use a stepladder: Make sure that it is fully opened. Do not climb a closed stepladder. Make sure that both sides of the stepladder are locked into place. Ask someone to hold it for you, if possible. Clearly mark and make sure that you can see: Any grab bars or handrails. First and last steps. Where the edge of each step is. Use tools that help you move around (mobility aids) if they are needed. These include: Canes. Walkers. Scooters. Crutches. Turn on the lights when you go into a dark area. Replace any light bulbs as soon as they burn out. Set up your furniture so you have a clear path. Avoid moving your furniture around. If any of your floors are uneven, fix them. If there are any pets around you, be aware of where they are. Review your medicines with your doctor. Some medicines can make you feel dizzy. This can increase your chance of falling. Ask your doctor what other things that you can do to help prevent falls. This information is not intended to replace advice given to you by your health care provider. Make sure you discuss any questions you have with your health care provider. Document Released: 01/13/2009 Document Revised: 08/25/2015 Document Reviewed: 04/23/2014 Elsevier Interactive Patient Education  2017 [employer].

## 2022-05-02 NOTE — Progress Notes (Addendum)
I connected with  Ryan Smith on 05/10/22 by a audio enabled telemedicine application and verified that I am speaking with the correct person using two identifiers.  Patient Location: Home  Provider Location: Home Office  I discussed the limitations of evaluation and management by telemedicine. The patient expressed understanding and agreed to proceed.  Patient Medicare AWV questionnaire was completed by the patient on 05/01/22; I have confirmed that all information answered by patient is correct and no changes since this date.            Subjective:   Ryan Smith is a 67 y.o. male who presents for an Initial Medicare Annual Wellness Visit.  Review of Systems     Cardiac Risk Factors include: advanced age (>81mn, >>33women);male gender     Objective:    Today's Vitals   05/02/22 1335  Weight: 200 lb (90.7 kg)   Body mass index is 30.41 kg/m.     05/02/2022    1:39 PM 11/25/2013    7:10 AM  Advanced Directives  Does Patient Have a Medical Advance Directive? No No  Would patient like information on creating a medical advance directive? No - Patient declined No - patient declined information    Current Medications (verified) Outpatient Encounter Medications as of 05/02/2022  Medication Sig   aspirin EC 81 MG tablet Take 81 mg by mouth daily.   Coenzyme Q10 10 MG capsule Take 10 mg by mouth daily.   dexlansoprazole (DEXILANT) 60 MG capsule TAKE 1 CAPSULE BY MOUTH EVERY DAY   ezetimibe (ZETIA) 10 MG tablet Take 1 tablet (10 mg total) by mouth daily.   FLUAD QUADRIVALENT 0.5 ML injection    hydrochlorothiazide (HYDRODIURIL) 25 MG tablet Take 1 tablet (25 mg total) by mouth daily.   losartan (COZAAR) 25 MG tablet Take 1 tablet (25 mg total) by mouth daily.   Magnesium 500 MG CAPS Take 1 capsule by mouth at bedtime.   metoprolol succinate (TOPROL-XL) 50 MG 24 hr tablet Take 1 tablet (50 mg total) by mouth daily. Take with or immediately following a meal.   Orange Peel  (D-LIMONENE) 1 g CAPS Take 1 g by mouth as needed.    psyllium (METAMUCIL) 58.6 % powder Take 1 packet by mouth at bedtime.   rosuvastatin (CRESTOR) 20 MG tablet Take 1 tablet (20 mg total) by mouth daily.   SPIKEVAX injection    [DISCONTINUED] ALPRAZolam (XANAX) 0.5 MG tablet TAKE 1 TABLET BY MOUTH 2 TIMES DAILY.   Facility-Administered Encounter Medications as of 05/02/2022  Medication   0.9 %  sodium chloride infusion    Allergies (verified) Patient has no known allergies.   History: Past Medical History:  Diagnosis Date   Abnormal LFTs    2008 neg. hep. serology, (-) ANA, ceruloplasmin, etc.;  liver biopsy showed  minimal active inflammation   Abnormal LFTs (liver function tests) 08/15/2010   increased LFTs: 2008 neg. hep. serology, (-) ANA, ceruloplasmin, etc.; liver biopsy showed minimal active inflammation     Anxiety    Anxiety state 08/30/2006   Qualifier: Diagnosis of  By: UCarley Hammed     Atherosclerosis of native arteries of the extremities with intermittent claudication 11/30/2013   Barrett's esophagus    BCC (basal cell carcinoma of skin)    Cerumen impaction 03/03/2014   [place]aud[place] 11/18/2013   COMMON MIGRAINE 08/30/2006   Qualifier: Diagnosis of  By: UCarley Hammed    Diverticulosis 08/15/2010   Elevated PSA  Esophageal reflux    GERD (gastroesophageal reflux disease) 08/15/2010   Hearing loss    has hearing aids   Hiatal hernia    HIATAL HERNIA WITH REFLUX 12/21/2009   Qualifier: Diagnosis of  By: Nils Pyle CMA ([place]), Mearl Latin     History of diverticulitis of colon    Hx of adenomatous colonic polyps 08/15/2010   Hyperlipidemia 10/03/2011   Hypertension, essential 07/22/2019   INSOMNIA 08/30/2006   Qualifier: Diagnosis of  By: Carley Hammed     LOSS, HEARING NOS 08/30/2006   Qualifier: Diagnosis of  By: Carley Hammed     Other specified disorders of liver 11/15/2009   Qualifier: Diagnosis of  Problem Stop Reason:  By: Sharlett Iles MD Byrd Hesselbach     Personal history of colonic polyps    tublar adenomas 2008 & hyerplastic 2011   Preop cardiovascular exam 01/19/2014   PVD (peripheral vascular disease) ([place])    PVD (peripheral vascular disease) with claudication ([place]) 01/04/2014   SCC (squamous cell carcinoma)    L shoulder   Tobacco use 11/24/2013   Past Surgical History:  Procedure Laterality Date   ABDOMINAL AORTAGRAM N/A 11/25/2013   Procedure: ABDOMINAL Maxcine Ham;  Surgeon: Wellington Hampshire, MD;  Location: [facility]ATH [facility];  Service: Cardiovascular;  Laterality: N/A;   COLONOSCOPY  02/07/2016   Laparoscopically-assisted Sigmoid Colectomy  11/10/2007   LIVER BIOPSY     minimal active inflammation   UPPER GASTROINTESTINAL ENDOSCOPY  02/07/2016   Family History  Problem Relation Age of Onset   Prostate cancer Father 65   Esophageal cancer Father    Coronary artery disease Father        dx age 72, CABG   Heart disease Father    Heart disease Paternal Uncle        x 2   Diverticulitis Mother    Colon cancer Neg Hx    Diabetes Neg Hx    Rectal cancer Neg Hx    Stomach cancer Neg Hx    Colon polyps Neg Hx    Social History   Socioeconomic History   Marital status: Married    Spouse name: Not on file   Number of children: 2   Years of education: Not on file   Highest education level: Not on file  Occupational History   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, music, [employer]    Employer: [employer]  Tobacco Use   Smoking status: Former    Years: 40.00    Types: Cigarettes    Quit date: 02/26/2019    Years since quitting: 3.2   Smokeless tobacco: Never   Tobacco comments:       Vaping Use   Vaping Use: Never used  Substance and Sexual Activity   Alcohol use: Yes    Alcohol/week: 20.0 standard drinks of alcohol    Types: 20 Glasses of wine per week    Comment: 3-4 glasses of wine qhs   Drug use: No   Sexual activity: Not on file  Other Topics Concern   Not on file  Social History Narrative   Born in [place]   Married, 2 step sons (adults)   Social Determinants of Health   Financial Resource Strain: Low Risk  (05/02/2022)   Overall Financial Resource Strain (CARDIA)    Difficulty of Paying Living Expenses: Not hard at all  Food Insecurity: No Food Insecurity (05/02/2022)   Hunger Vital Sign    Worried About Running Out of Food in the Last Year:  Never true    Ran Out of Food in the Last Year: Never true  Transportation Needs: No Transportation Needs (05/02/2022)   PRAPARE - Hydrologist (Medical): No    Lack of Transportation (Non-Medical): No  Physical Activity: Sufficiently Active (05/01/2022)   Exercise Vital Sign    Days of Exercise per Week: 7 days    Minutes of Exercise per Session: 30 min  Stress: Stress Concern Present (05/01/2022)   [place]    Feeling of Stress : To some extent  Social Connections: Moderately Isolated (05/01/2022)   Social Connection and Isolation Panel [NHANES]    Frequency of Communication with Friends and Family: Twice a week    Frequency of Social Gatherings with Friends and Family: More than three times a week    Attends Religious Services: Never    Marine scientist or Organizations: No    Attends Music therapist: Never    Marital Status: Married    Tobacco Counseling Counseling given: Not Answered Tobacco comments:     Clinical Intake:  Pre-visit preparation completed: Yes  Pain : No/denies pain     BMI - recorded: 30.41 Nutritional Status: BMI > 30  Obese Nutritional Risks: None Diabetes: No  How often do you need to have someone help you when you read instructions, pamphlets, or other written materials from your doctor or pharmacy?: 1 - Never  Diabetic?no  Interpreter Needed?: No  Information entered by :: Charlott Rakes, LPN (no changes per pt since [place])   Activities of Daily Living    05/01/2022    9:10 PM   In your present state of health, do you have any difficulty performing the following activities:  Hearing? 0  Vision? 0  Difficulty concentrating or making decisions? 0  Walking or climbing stairs? 0  Dressing or bathing? 0  Doing errands, shopping? 0  Preparing Food and eating ? N  Using the Toilet? N  In the past six months, have you accidently leaked urine? N  Do you have problems with loss of bowel control? N  Managing your Medications? N  Managing your Finances? N  Housekeeping or managing your Housekeeping? N    Patient Care Team: Colon Branch, MD as PCP - General (Internal Medicine) Wellington Hampshire, MD as Consulting Physician (Cardiology) Alyson Ingles Candee Furbish, MD as Consulting Physician (Urology) Danella Sensing, MD as Consulting Physician (Dermatology)  Indicate any recent Medical Services you may have received from other than [facility] providers in the past year (date may be approximate).     Assessment:   This is a routine wellness examination for [place].  Hearing/Vision screen Hearing Screening - Comments:: Pt wears hearing aids  Vision Screening - Comments:: Pt follows up with provider in [place] for annual eye exams   Dietary issues and exercise activities discussed: Current Exercise Habits: Home exercise routine, Type of exercise: walking, Time (Minutes): 30, Frequency (Times/Week): 7, Weekly Exercise (Minutes/Week): 210   Goals Addressed             This Visit's Progress    lose weight         Depression Screen    05/02/2022    1:38 PM 01/30/2022    1:20 PM 06/22/2021    1:58 PM 12/29/2020   10:40 AM 04/13/2020    1:27 PM 04/10/2019    9:56 AM 06/04/2018   11:26 AM  PHQ 2/9 Scores  PHQ -  2 Score 0 0 0 0 0 0 0  PHQ- 9 Score     2  1    Fall Risk    05/01/2022    9:10 PM 01/30/2022    1:19 PM 06/22/2021    1:58 PM 12/29/2020   10:40 AM 04/13/2020   12:58 PM  [place] in the past year? 0 0 0 0 0  Number falls in past yr: 0 0 0 0 0  Injury  with Fall? 0 0 0 0 0  Risk for fall due to : No Fall Risks;Impaired vision      Follow up Falls prevention discussed Falls evaluation completed  Falls evaluation completed     FALL RISK PREVENTION PERTAINING TO THE HOME:  Any stairs in or around the home? Yes  If so, are there any without handrails? No  Home free of loose throw rugs in walkways, pet beds, electrical cords, etc? Yes  Adequate lighting in your home to reduce risk of falls? Yes   ASSISTIVE DEVICES UTILIZED TO PREVENT FALLS:  Life alert? No  Use of a cane, walker or w/c? No  Grab bars in the bathroom? No  Shower chair or bench in shower? Yes  Elevated toilet seat or a handicapped toilet? Yes   TIMED UP AND GO:  Was the test performed? No .   Cognitive Function:        05/02/2022    1:41 PM  6CIT Screen  What Year? 0 points  What month? 0 points  What time? 0 points  Count back from 20 0 points  Months in reverse 0 points  Repeat phrase 0 points  Total Score 0 points    Immunizations Immunization History  Administered Date(s) Administered   Fluad Quad(high Dose 65+) 01/27/2020, 12/29/2020, 01/22/2022   Influenza Split 01/14/2018   Influenza Whole 01/19/2010   Influenza,inj,Quad PF,6+ Mos 12/26/2018   Influenza-Unspecified 12/31/2012, 01/26/2014, 12/31/2016, 12/31/2017   PFIZER(Purple Top)SARS-COV-2 Vaccination 05/10/2019, 06/04/2019, 03/14/2020   Pfizer Covid-19 Vaccine Bivalent Booster 27yr & up 01/24/2021, 01/22/2022   Pneumococcal Polysaccharide-23 08/16/2014   Td 04/02/2005   Tdap 06/19/2016    TDAP status: Up to date  Flu Vaccine status: Up to date  Pneumococcal vaccine status: Due, Education has been provided regarding the importance of this vaccine. Advised may receive this vaccine at local pharmacy or Health Dept. Aware to provide a copy of the vaccination record if obtained from local pharmacy or Health Dept. Verbalized acceptance and understanding.  Covid-19 vaccine status: Completed  vaccines  Qualifies for Shingles Vaccine? Yes   Zostavax completed No   Shingrix Completed?: No.    Education has been provided regarding the importance of this vaccine. Patient has been advised to call insurance company to determine out of pocket expense if they have not yet received this vaccine. Advised may also receive vaccine at local pharmacy or Health Dept. Verbalized acceptance and understanding.  Screening Tests Health Maintenance  Topic Date Due   Zoster Vaccines- Shingrix (1 of 2) Never done   Pneumonia Vaccine 68 Years old (2 of 2 - PCV) 01/10/2020   COVID-19 Vaccine (6 - 2023-24 season) 03/19/2022   Medicare Annual Wellness (AWV)  05/03/2023   DTaP/Tdap/Td (3 - Td or Tdap) 06/20/2026   COLONOSCOPY (Pts 45-418yrInsurance coverage will need to be confirmed)  03/01/2029   INFLUENZA VACCINE  Completed   Hepatitis C Screening  Completed   HPV VACCINES  Aged Out    Health Maintenance  Health Maintenance Due  Topic Date Due   Zoster Vaccines- Shingrix (1 of 2) Never done   Pneumonia Vaccine 41+ Years old (2 of 2 - PCV) 01/10/2020   COVID-19 Vaccine (6 - 2023-24 season) 03/19/2022    Colorectal cancer screening: Type of screening: Colonoscopy. Completed 03/01/22. Repeat every 7 years   Additional Screening:  Hepatitis C Screening:  Completed 10/24/06  Vision Screening: Recommended annual ophthalmology exams for early detection of glaucoma and other disorders of the eye. Is the patient up to date with their annual eye exam?  Yes  Who is the provider or what is the name of the office in which the patient attends annual eye exams? Provider in [place]  If pt is not established with a provider, would they like to be referred to a provider to establish care? No .   Dental Screening: Recommended annual dental exams for proper oral hygiene  Community Resource Referral / Chronic Care Management: CRR required this visit?  No   CCM required this visit?  No      Plan:      I have personally reviewed and noted the following in the patient's chart:   Medical and social history Use of alcohol, tobacco or illicit drugs  Current medications and supplements including opioid prescriptions. Patient is not currently taking opioid prescriptions. Functional ability and status Nutritional status Physical activity Advanced directives List of other physicians Hospitalizations, surgeries, and ER visits in previous 12 months Vitals Screenings to include cognitive, depression, and falls Referrals and appointments  In addition, I have reviewed and discussed with patient certain preventive protocols, quality metrics, and best practice recommendations. A written personalized care plan for preventive services as well as general preventive health recommendations were provided to patient.     Willette Brace, LPN   [id number]   Nurse Notes: none

## 2022-05-02 NOTE — Progress Notes (Signed)
I have reviewed and agree with [facility] documentation.  Kathlene November, MD

## 2022-05-03 ENCOUNTER — Other Ambulatory Visit: Payer: Self-pay | Admitting: Family

## 2022-05-07 ENCOUNTER — Encounter: Payer: Self-pay | Admitting: Internal Medicine

## 2022-05-08 MED ORDER — ALPRAZOLAM 0.5 MG PO TABS: 0.5000 mg | ORAL_TABLET | Freq: Two times a day (BID) | ORAL | 2 refills | Status: DC

## 2022-05-08 NOTE — Telephone Encounter (Signed)
Requesting: alprazolam 0.'5mg'$   Contract: 01/30/22 UDS: 01/30/22 Last Visit: 01/30/22 Next Visit: None Last Refill: 12/03/21 #60 and 4RF   Please Advise

## 2022-05-08 NOTE — Telephone Encounter (Signed)
PDMP okay, Rx sent 

## 2022-05-11 NOTE — Progress Notes (Signed)
I have reviewed and agree with [facility] documentation.  Kathlene November, MD

## 2022-07-13 ENCOUNTER — Encounter: Payer: Self-pay | Admitting: Internal Medicine

## 2022-07-25 ENCOUNTER — Other Ambulatory Visit: Payer: Self-pay | Admitting: Internal Medicine

## 2022-08-01 DIAGNOSIS — L905 Scar conditions and fibrosis of skin: Secondary | ICD-10-CM | POA: Diagnosis not present

## 2022-08-01 DIAGNOSIS — D225 Melanocytic nevi of trunk: Secondary | ICD-10-CM | POA: Diagnosis not present

## 2022-08-01 DIAGNOSIS — D1801 Hemangioma of skin and subcutaneous tissue: Secondary | ICD-10-CM | POA: Diagnosis not present

## 2022-08-01 DIAGNOSIS — L821 Other seborrheic keratosis: Secondary | ICD-10-CM | POA: Diagnosis not present

## 2022-08-01 DIAGNOSIS — L918 Other hypertrophic disorders of the skin: Secondary | ICD-10-CM | POA: Diagnosis not present

## 2022-08-01 DIAGNOSIS — Z85828 Personal history of other malignant neoplasm of skin: Secondary | ICD-10-CM | POA: Diagnosis not present

## 2022-08-01 DIAGNOSIS — L570 Actinic keratosis: Secondary | ICD-10-CM | POA: Diagnosis not present

## 2022-08-02 ENCOUNTER — Other Ambulatory Visit: Payer: Self-pay | Admitting: Internal Medicine

## 2022-08-07 ENCOUNTER — Encounter: Payer: Self-pay | Admitting: Internal Medicine

## 2022-08-07 ENCOUNTER — Ambulatory Visit (INDEPENDENT_AMBULATORY_CARE_PROVIDER_SITE_OTHER): Payer: Medicare Other | Admitting: Internal Medicine

## 2022-08-07 VITALS — BP 132/72 | HR 74 | Temp 98.1°F | Resp 16 | Ht 68.0 in | Wt 198.5 lb

## 2022-08-07 DIAGNOSIS — E785 Hyperlipidemia, unspecified: Secondary | ICD-10-CM | POA: Diagnosis not present

## 2022-08-07 DIAGNOSIS — E875 Hyperkalemia: Secondary | ICD-10-CM | POA: Diagnosis not present

## 2022-08-07 DIAGNOSIS — Z0000 Encounter for general adult medical examination without abnormal findings: Secondary | ICD-10-CM

## 2022-08-07 DIAGNOSIS — I10 Essential (primary) hypertension: Secondary | ICD-10-CM

## 2022-08-07 DIAGNOSIS — R972 Elevated prostate specific antigen [PSA]: Secondary | ICD-10-CM | POA: Diagnosis not present

## 2022-08-07 DIAGNOSIS — R739 Hyperglycemia, unspecified: Secondary | ICD-10-CM

## 2022-08-07 NOTE — Assessment & Plan Note (Signed)
-   Td  05-2016  - pnm 23: 2016 -Vaccines I recommend: RSV, pneumonia shot (PNM 20), flu shot every fall.  Shingrix. Pros>cons, d/w pt --CCS: last colonoscopy 10/2009, polyp,   cscope 02-2016: + Polyps, C-scope November 2023, next per GI. --Prostate cancer screening: 12-2016 prostate bx (-);  PSAs remain elevated but stable, previously declined to see urology, declines a DRE today, request simply a PSA.  Will do.  No symptoms. --Exercise-diet: Pointers provided --Lung cancer screening:   does not qualify under Medicare guidelines.   -- AAA  Screening (-) 02/2020 --Labs:  BMP AST ALT FLP CBC A1c PSA - EtOH: Drinks daily, usually 3 to 4 glasses of wine nightly, recommend moderation, states he is trying to drink only 3-4 times a week. -Healthcare POA: See AVS

## 2022-08-07 NOTE — Progress Notes (Signed)
Subjective:    Patient ID: Ryan Smith, male    DOB: [birth year], 67 y.o.   MRN: [id number]  DOS:  08/07/2022 Type of visit - description: cpx  Here for CPX. Has no actual concerns, reports is doing well.  Review of Systems   A 14 point review of systems is negative    Past Medical History:  Diagnosis Date   Abnormal LFTs    2008 neg. hep. serology, (-) ANA, ceruloplasmin, etc.;  liver biopsy showed  minimal active inflammation   Abnormal LFTs (liver function tests) 08/15/2010   increased LFTs: 2008 neg. hep. serology, (-) ANA, ceruloplasmin, etc.; liver biopsy showed minimal active inflammation     Anxiety    Anxiety state 08/30/2006   Qualifier: Diagnosis of  By: Ardyth Man      Atherosclerosis of native arteries of the extremities with intermittent claudication 11/30/2013   Barrett's esophagus    BCC (basal cell carcinoma of skin)    Cerumen impaction 03/03/2014   Claudication (HCC) 11/18/2013   COMMON MIGRAINE 08/30/2006   Qualifier: Diagnosis of  By: Ardyth Man     Diverticulosis 08/15/2010   Elevated PSA    Esophageal reflux    GERD (gastroesophageal reflux disease) 08/15/2010   Hearing loss    has hearing aids   Hiatal hernia    HIATAL HERNIA WITH REFLUX 12/21/2009   Qualifier: Diagnosis of  By: Koleen Distance CMA (AAMA), Leisha     History of diverticulitis of colon    Hx of adenomatous colonic polyps 08/15/2010   Hyperlipidemia 10/03/2011   Hypertension, essential 07/22/2019   INSOMNIA 08/30/2006   Qualifier: Diagnosis of  By: Ardyth Man     LOSS, HEARING NOS 08/30/2006   Qualifier: Diagnosis of  By: Ardyth Man     Other specified disorders of liver 11/15/2009   Qualifier: Diagnosis of  Problem Stop Reason:  By: Jarold Motto MD Lang Snow    Personal history of colonic polyps    tublar adenomas 2008 & hyerplastic 2011   Preop cardiovascular exam 01/19/2014   PVD (peripheral vascular disease) (HCC)    PVD (peripheral vascular disease) with claudication  (HCC) 01/04/2014   SCC (squamous cell carcinoma)    L shoulder   Tobacco use 11/24/2013    Past Surgical History:  Procedure Laterality Date   ABDOMINAL AORTAGRAM N/A 11/25/2013   Procedure: ABDOMINAL Ronny Flurry;  Surgeon: Iran Ouch, MD;  Location: [facility];  Service: Cardiovascular;  Laterality: N/A;   COLONOSCOPY  02/07/2016   Laparoscopically-assisted Sigmoid Colectomy  11/10/2007   LIVER BIOPSY     minimal active inflammation   UPPER GASTROINTESTINAL ENDOSCOPY  02/07/2016   Social History   Socioeconomic History   Marital status: Married    Spouse name: Not on file   Number of children: 2   Years of education: Not on file   Highest education level: Not on file  Occupational History   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, [employer]    Employer: [employer]  Tobacco Use   Smoking status: Former    Packs/day: 0.25    Years: 40.00    Additional pack years: 0.00    Total pack years: 10.00    Types: Cigarettes    Quit date: 02/26/2019    Years since quitting: 3.4   Smokeless tobacco: Never   Tobacco comments:       Vaping Use   Vaping Use: Never used  Substance and Sexual Activity   Alcohol use: Yes  Alcohol/week: 20.0 standard drinks of alcohol    Types: 20 Glasses of wine per week    Comment: 3-4 glasses of wine qhs   Drug use: No   Sexual activity: Not on file  Other Topics Concern   Not on file  Social History Narrative   Born in [place]   Married, 2 step sons (adults)   Social Determinants of Health   Financial Resource Strain: Low Risk  (05/02/2022)   Overall Financial Resource Strain (CARDIA)    Difficulty of Paying Living Expenses: Not hard at all  Food Insecurity: No Food Insecurity (05/02/2022)   Hunger Vital Sign    Worried About Running Out of Food in the Last Year: Never true    Ran Out of Food in the Last Year: Never true  Transportation Needs: No Transportation Needs (05/02/2022)   PRAPARE - Scientist, research (physical sciences) (Medical): No    Lack of Transportation (Non-Medical): No  Physical Activity: Sufficiently Active (05/01/2022)   Exercise Vital Sign    Days of Exercise per Week: 7 days    Minutes of Exercise per Session: 30 min  Stress: Stress Concern Present (05/01/2022)   [employer] of Occupational Health - Occupational Stress Questionnaire    Feeling of Stress : To some extent  Social Connections: Moderately Isolated (05/01/2022)   Social Connection and Isolation Panel [NHANES]    Frequency of Communication with Friends and Family: Twice a week    Frequency of Social Gatherings with Friends and Family: More than three times a week    Attends Religious Services: Never    Database administrator or Organizations: No    Attends Banker Meetings: Never    Marital Status: Married  Catering manager Violence: Not At Risk (05/02/2022)   Humiliation, Afraid, Rape, and Kick questionnaire    Fear of Current or Ex-Partner: No    Emotionally Abused: No    Physically Abused: No    Sexually Abused: No    Current Outpatient Medications  Medication Instructions   ALPRAZolam (XANAX) 0.5 mg, Oral, 2 times daily   aspirin EC 81 mg, Oral, Daily   Coenzyme Q10 10 mg, Daily   D-Limonene 1 g, Oral, As needed   dexlansoprazole (DEXILANT) 60 mg, Oral, Daily   ezetimibe (ZETIA) 10 mg, Oral, Daily   hydrochlorothiazide (HYDRODIURIL) 25 mg, Oral, Daily   losartan (COZAAR) 25 mg, Oral, Daily   Magnesium 500 MG CAPS 1 capsule, Oral, Daily at bedtime,     metoprolol succinate (TOPROL-XL) 50 mg, Oral, Daily, TAKE WITH OR IMMEDIATELY FOLLOWING A MEAL.   psyllium (METAMUCIL) 58.6 % powder 1 packet, Oral, Daily at bedtime   rosuvastatin (CRESTOR) 20 mg, Oral, Daily       Objective:   Physical Exam BP 132/72   Pulse 74   Temp 98.1 F (36.7 C) (Oral)   Resp 16   Ht 5\' 8"  (1.727 m)   Wt 198 lb 8 oz (90 kg)   SpO2 96%   BMI 30.18 kg/m  General: Well developed, NAD, BMI noted Neck:  No  thyromegaly  HEENT:  Normocephalic . Face symmetric, atraumatic Lungs:  CTA B Normal respiratory effort, no intercostal retractions, no accessory muscle use. Heart: RRR,  no murmur.  Abdomen:  Not distended, soft, non-tender. No rebound or rigidity. DRE: Declined Lower extremities: no pretibial edema bilaterally  Skin: Exposed areas without rash. Not pale. Not jaundice Neurologic:  alert & oriented X3.  Speech normal, gait  appropriate for age and unassisted Strength symmetric and appropriate for age.  Psych: Cognition and judgment appear intact.  Cooperative with normal attention span and concentration.  Behavior appropriate. No anxious or depressed appearing.     Assessment   Assessment HTN: dx 07/2019 (intolerant amlodipine, carvedilol, see OV 09/07/2019) Anxiety Hyperlipidemia  CV: --PVD R leg, dx 2015,   claudication 10-2013, ABIs showed obstruction, saw Dr Kirke Corin, had a aortogram , was referred to surgery. They rx a CT chest show mild atherosclerosis disease of the aorta, coronary arthrosclerosis. Had a MRI of the right leg, see report. Was recommended a bypass but sx  improved, did not proceed. No benefit from cilostazol --CT coronary morphology  10-2019:Mild to moderate, likely non-obstructive mixed CAD predominantly in the LAD,  GI:  --Barrett's, GERD, last EGD,BX : 02-2016  --Abnormal LFTs: 2008 neg. hep. serology, (-) ANA, ceruloplasmin, etc.;  liver bx:  minimal active inflammation Hearing loss, has aids  Sees dermatology  as off 05-2021 Prostate BX 12-2016 (-)  PLAN: Here for CPX. HTN: On HCTZ, losartan, metoprolol.  Ambulatory BPs in the 130s, occasionally in the afternoon in the 140s.  Recommend to check more frequently, once a week.  Call if not at goal.  Checking labs Anxiety: Uses Xanax as needed, symptoms controlled Hyperlipidemia: On Zetia, rosuvastatin.  Checking labs Nonobstructive CAD: On aspirin, statins, Zetia.  Denies symptoms GERD, history of  Barrett's: Per GI, had EGD recently. RTC 1 year

## 2022-08-07 NOTE — Patient Instructions (Addendum)
Vaccines I recommend: RSV, pneumonia shot (PNM 20), flu shot every fall.  Shingrix.  Check the  blood pressure once a week BP GOAL is between 110/65 and  135/85. If it is consistently higher or lower, let me know    GO TO THE LAB : Get the blood work     GO TO THE FRONT DESK, PLEASE SCHEDULE YOUR APPOINTMENTS Come back for a complete physical exam in one year        "Health Care Power of attorney" ,  "Living will" (Advance care planning documents)  If you already have a living will or healthcare power of attorney, is recommended you bring the copy to be scanned in your chart.   The document will be available to all the doctors you see in the system.  Advance care planning is a process that supports adults in  understanding and sharing their preferences regarding future medical care.  The patient's preferences are recorded in documents called Advance Directives and the can be modified at any time while the patient is in full mental capacity.   If you don't have one, please consider create one.      More information at: [email or web address]

## 2022-08-07 NOTE — Assessment & Plan Note (Signed)
Here for CPX. HTN: On HCTZ, losartan, metoprolol.  Ambulatory BPs in the 130s, occasionally in the afternoon in the 140s.  Recommend to check more frequently, once a week.  Call if not at goal.  Checking labs Anxiety: Uses Xanax as needed, symptoms controlled Hyperlipidemia: On Zetia, rosuvastatin.  Checking labs Nonobstructive CAD: On aspirin, statins, Zetia.  Denies symptoms GERD, history of Barrett's: Per GI, had EGD recently. RTC 1 year

## 2022-08-08 LAB — ALT: ALT: 68 U/L — ABNORMAL HIGH (ref 0–53)

## 2022-08-08 LAB — HEMOGLOBIN A1C: Hgb A1c MFr Bld: 5.9 % (ref 4.6–6.5)

## 2022-08-08 LAB — BASIC METABOLIC PANEL
BUN: 12 mg/dL (ref 6–23)
CO2: 29 mEq/L (ref 19–32)
Calcium: 9.5 mg/dL (ref 8.4–10.5)
Chloride: 103 mEq/L (ref 96–112)
Creatinine, Ser: 0.98 mg/dL (ref 0.40–1.50)
GFR: 79.75 mL/min (ref >60.00)
Glucose, Bld: 95 mg/dL (ref 70–99)
Potassium: 5.4 mEq/L — ABNORMAL HIGH (ref 3.5–5.1)
Sodium: 141 mEq/L (ref 135–145)

## 2022-08-08 LAB — LIPID PANEL
Cholesterol: 123 mg/dL (ref 0–200)
HDL: 39.5 mg/dL (ref >39.00)
LDL Cholesterol: 53 mg/dL (ref 0–99)
NonHDL: 83.49
Total CHOL/HDL Ratio: 3
Triglycerides: 152 mg/dL — ABNORMAL HIGH (ref 0.0–149.0)
VLDL: 30.4 mg/dL (ref 0.0–40.0)

## 2022-08-08 LAB — AST: AST: 49 U/L — ABNORMAL HIGH (ref 0–37)

## 2022-08-08 LAB — CBC WITH DIFFERENTIAL/PLATELET
Basophils Absolute: 0.1 10*3/uL (ref 0.0–0.1)
Basophils Relative: 0.9 % (ref 0.0–3.0)
Eosinophils Absolute: 0.2 10*3/uL (ref 0.0–0.7)
Eosinophils Relative: 2.1 % (ref 0.0–5.0)
HCT: 50.5 % (ref 39.0–52.0)
Hemoglobin: 16.8 g/dL (ref 13.0–17.0)
Lymphocytes Relative: 26.8 % (ref 12.0–46.0)
Lymphs Abs: 2.1 10*3/uL (ref 0.7–4.0)
MCHC: 33.3 g/dL (ref 30.0–36.0)
MCV: 93.9 fl (ref 78.0–100.0)
Monocytes Absolute: 0.8 10*3/uL (ref 0.1–1.0)
Monocytes Relative: 10.4 % (ref 3.0–12.0)
Neutro Abs: 4.7 10*3/uL (ref 1.4–7.7)
Neutrophils Relative %: 59.8 % (ref 43.0–77.0)
Platelets: 439 10*3/uL — ABNORMAL HIGH (ref 150.0–400.0)
RBC: 5.38 Mil/uL (ref 4.22–5.81)
RDW: 13.5 % (ref 11.5–15.5)
WBC: 7.8 10*3/uL (ref 4.0–10.5)

## 2022-08-08 LAB — PSA: PSA: 5.46 ng/mL — ABNORMAL HIGH (ref 0.10–4.00)

## 2022-08-09 NOTE — Addendum Note (Signed)
Addended byConrad Holley D on: 08/09/2022 01:11 PM   Modules accepted: Orders

## 2022-08-10 ENCOUNTER — Encounter: Payer: Self-pay | Admitting: Internal Medicine

## 2022-08-13 ENCOUNTER — Telehealth: Payer: Self-pay | Admitting: Internal Medicine

## 2022-08-13 NOTE — Telephone Encounter (Signed)
PDMP okay, Rx sent 

## 2022-08-13 NOTE — Telephone Encounter (Signed)
Requesting: alprazolam 0.5mg  Contract: 08/07/22 UDS: 01/30/22 Last Visit: 08/07/22 Next Visit: None Last Refill: 05/08/22 #60 and 2RF  Please Advise

## 2022-08-15 ENCOUNTER — Other Ambulatory Visit: Payer: Self-pay | Admitting: Internal Medicine

## 2022-08-21 ENCOUNTER — Other Ambulatory Visit: Payer: Self-pay | Admitting: Internal Medicine

## 2022-08-28 ENCOUNTER — Other Ambulatory Visit: Payer: Self-pay | Admitting: Internal Medicine

## 2022-09-18 ENCOUNTER — Other Ambulatory Visit (INDEPENDENT_AMBULATORY_CARE_PROVIDER_SITE_OTHER): Payer: Medicare Other

## 2022-09-18 DIAGNOSIS — E875 Hyperkalemia: Secondary | ICD-10-CM | POA: Diagnosis not present

## 2022-09-18 LAB — BASIC METABOLIC PANEL
BUN: 13 mg/dL (ref 6–23)
CO2: 30 mEq/L (ref 19–32)
Calcium: 9.7 mg/dL (ref 8.4–10.5)
Chloride: 100 mEq/L (ref 96–112)
Creatinine, Ser: 1.08 mg/dL (ref 0.40–1.50)
GFR: 70.92 mL/min (ref >60.00)
Glucose, Bld: 110 mg/dL — ABNORMAL HIGH (ref 70–99)
Potassium: 5.1 mEq/L (ref 3.5–5.1)
Sodium: 139 mEq/L (ref 135–145)

## 2022-09-19 ENCOUNTER — Encounter: Payer: Self-pay | Admitting: Internal Medicine

## 2022-09-22 ENCOUNTER — Encounter: Payer: Self-pay | Admitting: Gastroenterology

## 2022-09-24 ENCOUNTER — Other Ambulatory Visit: Payer: Self-pay

## 2022-09-24 MED ORDER — FAMOTIDINE 40 MG PO TABS: 40.0000 mg | ORAL_TABLET | Freq: Every day | ORAL | 0 refills | Status: DC

## 2022-09-26 ENCOUNTER — Encounter: Payer: Self-pay | Admitting: Internal Medicine

## 2022-09-26 ENCOUNTER — Ambulatory Visit (INDEPENDENT_AMBULATORY_CARE_PROVIDER_SITE_OTHER): Payer: Medicare Other | Admitting: Internal Medicine

## 2022-09-26 VITALS — BP 126/74 | HR 84 | Temp 97.8°F | Resp 18 | Ht 68.0 in | Wt 206.0 lb

## 2022-09-26 DIAGNOSIS — M25511 Pain in right shoulder: Secondary | ICD-10-CM | POA: Diagnosis not present

## 2022-09-26 NOTE — Progress Notes (Unsigned)
Subjective:    Patient ID: Ryan Smith, male    DOB: Feb 17, 1956, 67 y.o.   MRN: [id number]  DOS:  09/26/2022 Type of visit - description: acute  Symptoms started 2 weeks ago in the context of packing and unpacking his car and doing some heavy lifting. The pain is located at the side of the right shoulder. Worse when raising his arm particularly when the arm goes away from the chest.  No neck pain   Review of Systems See above   Past Medical History:  Diagnosis Date   Abnormal LFTs    2008 neg. hep. serology, (-) ANA, ceruloplasmin, etc.;  liver biopsy showed  minimal active inflammation   Abnormal LFTs (liver function tests) 08/15/2010   increased LFTs: 2008 neg. hep. serology, (-) ANA, ceruloplasmin, etc.; liver biopsy showed minimal active inflammation     Anxiety    Anxiety state 08/30/2006   Qualifier: Diagnosis of  By: Ardyth Man      Atherosclerosis of native arteries of the extremities with intermittent claudication 11/30/2013   Barrett's esophagus    BCC (basal cell carcinoma of skin)    Cerumen impaction 03/03/2014   Claudication (HCC) 11/18/2013   COMMON MIGRAINE 08/30/2006   Qualifier: Diagnosis of  By: Ardyth Man     Diverticulosis 08/15/2010   Elevated PSA    Esophageal reflux    GERD (gastroesophageal reflux disease) 08/15/2010   Hearing loss    has hearing aids   Hiatal hernia    HIATAL HERNIA WITH REFLUX 12/21/2009   Qualifier: Diagnosis of  By: Koleen Distance CMA (AAMA), Leisha     History of diverticulitis of colon    Hx of adenomatous colonic polyps 08/15/2010   Hyperlipidemia 10/03/2011   Hypertension, essential 07/22/2019   INSOMNIA 08/30/2006   Qualifier: Diagnosis of  By: Ardyth Man     LOSS, HEARING NOS 08/30/2006   Qualifier: Diagnosis of  By: Ardyth Man     Other specified disorders of liver 11/15/2009   Qualifier: Diagnosis of  Problem Stop Reason:  By: Jarold Motto MD Lang Snow    Personal history of colonic polyps    tublar  adenomas 2008 & hyerplastic 2011   Preop cardiovascular exam 01/19/2014   PVD (peripheral vascular disease) (HCC)    PVD (peripheral vascular disease) with claudication (HCC) 01/04/2014   SCC (squamous cell carcinoma)    L shoulder   Tobacco use 11/24/2013    Past Surgical History:  Procedure Laterality Date   ABDOMINAL AORTAGRAM N/A 11/25/2013   Procedure: ABDOMINAL Ronny Flurry;  Surgeon: Iran Ouch, MD;  Location: [facility];  Service: Cardiovascular;  Laterality: N/A;   COLONOSCOPY  02/07/2016   Laparoscopically-assisted Sigmoid Colectomy  11/10/2007   LIVER BIOPSY     minimal active inflammation   UPPER GASTROINTESTINAL ENDOSCOPY  02/07/2016    Current Outpatient Medications  Medication Instructions   ALPRAZolam (XANAX) 0.5 mg, Oral, 2 times daily   aspirin EC 81 mg, Oral, Daily   Coenzyme Q10 10 mg, Daily   D-Limonene 1 g, Oral, As needed   dexlansoprazole (DEXILANT) 60 mg, Oral, Daily   ezetimibe (ZETIA) 10 mg, Oral, Daily   famotidine (PEPCID) 40 mg, Oral, Daily at bedtime   hydrochlorothiazide (HYDRODIURIL) 25 mg, Oral, Daily   losartan (COZAAR) 25 mg, Oral, Daily   Magnesium 500 MG CAPS 1 capsule, Oral, Daily at bedtime,     metoprolol succinate (TOPROL-XL) 50 mg, Oral, Daily, TAKE WITH OR IMMEDIATELY FOLLOWING A MEAL.  psyllium (METAMUCIL) 58.6 % powder 1 packet, Oral, Daily at bedtime   rosuvastatin (CRESTOR) 20 mg, Oral, Daily       Objective:   Physical Exam BP 126/74   Pulse 84   Temp 97.8 F (36.6 C) (Oral)   Resp 18   Ht 5\' 8"  (1.727 m)   Wt 206 lb (93.4 kg)   SpO2 97%   BMI 31.32 kg/m  General:   Well developed, NAD, BMI noted. HEENT:  Normocephalic . Face symmetric, atraumatic L shoulder: Normal. R shoulder: On simple inspection, mild anterior swelling?.  Range of motion is okay but limited by pain with abduction and external rotation.   Hawkins +. Skin: Not pale. Not jaundice Neurologic:  alert & oriented X3.  Speech normal, gait  appropriate for age and unassisted Psych--  Cognition and judgment appear intact.  Cooperative with normal attention span and concentration.  Behavior appropriate. No anxious or depressed appearing.      Assessment     Assessment HTN: dx 07/2019 (intolerant amlodipine, carvedilol, see OV 09/07/2019) Anxiety Hyperlipidemia  CV: --PVD R leg, dx 2015,   claudication 10-2013, ABIs showed obstruction, saw Dr Kirke Corin, had a aortogram , was referred to surgery. They rx a CT chest show mild atherosclerosis disease of the aorta, coronary arthrosclerosis. Had a MRI of the right leg, see report. Was recommended a bypass but sx  improved, did not proceed. No benefit from cilostazol --CT coronary morphology  10-2019:Mild to moderate, likely non-obstructive mixed CAD predominantly in the LAD,  GI:  --Barrett's, GERD, last EGD,BX : 02-2016  --Abnormal LFTs: 2008 neg. hep. serology, (-) ANA, ceruloplasmin, etc.;  liver bx:  minimal active inflammation Hearing loss, has aids  Sees dermatology  as off 05-2021 Prostate BX 12-2016 (-)  PLAN: Right shoulder injury: Impingement versus rotator cuff injury. Recommend Tylenol, icing, occasional ibuprofen. Refer to sports medicine.

## 2022-09-26 NOTE — Patient Instructions (Signed)
No overuse  Tylenol  500 mg OTC 2 tabs a day every 8 hours as needed for pain  Ok to take occasional ibuprofen w/ food  Icing

## 2022-09-27 DIAGNOSIS — M25511 Pain in right shoulder: Secondary | ICD-10-CM | POA: Diagnosis not present

## 2022-09-27 NOTE — Assessment & Plan Note (Signed)
Right shoulder injury: Impingement versus rotator cuff injury. Recommend Tylenol, icing, occasional ibuprofen. Refer to sports medicine.

## 2022-10-02 DIAGNOSIS — M7541 Impingement syndrome of right shoulder: Secondary | ICD-10-CM | POA: Diagnosis not present

## 2022-10-18 ENCOUNTER — Other Ambulatory Visit: Payer: Self-pay | Admitting: Urology

## 2022-10-18 DIAGNOSIS — R972 Elevated prostate specific antigen [PSA]: Secondary | ICD-10-CM

## 2022-10-26 DIAGNOSIS — M25511 Pain in right shoulder: Secondary | ICD-10-CM | POA: Diagnosis not present

## 2022-11-21 ENCOUNTER — Other Ambulatory Visit: Payer: Self-pay | Admitting: Internal Medicine

## 2022-11-27 DIAGNOSIS — D485 Neoplasm of uncertain behavior of skin: Secondary | ICD-10-CM | POA: Diagnosis not present

## 2022-11-27 DIAGNOSIS — L821 Other seborrheic keratosis: Secondary | ICD-10-CM | POA: Diagnosis not present

## 2022-11-27 DIAGNOSIS — B078 Other viral warts: Secondary | ICD-10-CM | POA: Diagnosis not present

## 2022-11-27 DIAGNOSIS — Z85828 Personal history of other malignant neoplasm of skin: Secondary | ICD-10-CM | POA: Diagnosis not present

## 2022-12-04 ENCOUNTER — Ambulatory Visit
Admission: RE | Admit: 2022-12-04 | Discharge: 2022-12-04 | Disposition: A | Discharge status: Home / Self Care | Payer: Medicare Other | Source: Ambulatory Visit | Attending: Urology | Admitting: Urology

## 2022-12-04 DIAGNOSIS — R972 Elevated prostate specific antigen [PSA]: Secondary | ICD-10-CM

## 2022-12-04 MED ORDER — GADOPICLENOL 0.5 MMOL/ML IV SOLN
10.0000 mL | Freq: Once | INTRAVENOUS | Status: AC | PRN
  Administered 2022-12-04: 10 mL via INTRAVENOUS

## 2022-12-06 DIAGNOSIS — M7541 Impingement syndrome of right shoulder: Secondary | ICD-10-CM | POA: Diagnosis not present

## 2022-12-11 DIAGNOSIS — B078 Other viral warts: Secondary | ICD-10-CM | POA: Diagnosis not present

## 2022-12-11 DIAGNOSIS — D485 Neoplasm of uncertain behavior of skin: Secondary | ICD-10-CM | POA: Diagnosis not present

## 2022-12-12 ENCOUNTER — Telehealth: Payer: Self-pay | Admitting: Internal Medicine

## 2022-12-13 DIAGNOSIS — M7541 Impingement syndrome of right shoulder: Secondary | ICD-10-CM | POA: Diagnosis not present

## 2022-12-13 NOTE — Telephone Encounter (Signed)
Requesting:alprazolam 0.5mg   Contract: 02/06/22 UDS: 01/30/22 Last Visit: 09/26/22 Next Visit: None Last Refill: 08/13/22 #60 and 3rf  Please Advise

## 2022-12-13 NOTE — Telephone Encounter (Signed)
PDMP okay, Rx sent 

## 2023-01-17 ENCOUNTER — Other Ambulatory Visit: Payer: Self-pay | Admitting: Gastroenterology

## 2023-02-04 DIAGNOSIS — L309 Dermatitis, unspecified: Secondary | ICD-10-CM | POA: Diagnosis not present

## 2023-02-04 DIAGNOSIS — B078 Other viral warts: Secondary | ICD-10-CM | POA: Diagnosis not present

## 2023-02-04 DIAGNOSIS — D044 Carcinoma in situ of skin of scalp and neck: Secondary | ICD-10-CM | POA: Diagnosis not present

## 2023-02-04 DIAGNOSIS — L820 Inflamed seborrheic keratosis: Secondary | ICD-10-CM | POA: Diagnosis not present

## 2023-02-04 DIAGNOSIS — Z85828 Personal history of other malignant neoplasm of skin: Secondary | ICD-10-CM | POA: Diagnosis not present

## 2023-02-04 DIAGNOSIS — D225 Melanocytic nevi of trunk: Secondary | ICD-10-CM | POA: Diagnosis not present

## 2023-02-04 DIAGNOSIS — D485 Neoplasm of uncertain behavior of skin: Secondary | ICD-10-CM | POA: Diagnosis not present

## 2023-02-04 DIAGNOSIS — L570 Actinic keratosis: Secondary | ICD-10-CM | POA: Diagnosis not present

## 2023-02-04 DIAGNOSIS — L821 Other seborrheic keratosis: Secondary | ICD-10-CM | POA: Diagnosis not present

## 2023-02-20 ENCOUNTER — Encounter: Payer: Self-pay | Admitting: Internal Medicine

## 2023-03-12 ENCOUNTER — Other Ambulatory Visit: Payer: Self-pay | Admitting: Gastroenterology

## 2023-04-08 ENCOUNTER — Telehealth: Payer: Self-pay | Admitting: Gastroenterology

## 2023-04-08 MED ORDER — FAMOTIDINE 40 MG PO TABS: ORAL_TABLET | ORAL | 0 refills | Status: DC

## 2023-04-08 NOTE — Telephone Encounter (Signed)
 Patient is requesting a refill of famotidine. Patient has been scheduled to see Dr. Tomasa Rand on 04/15/23. Is it ok if I refill until appt?

## 2023-04-08 NOTE — Telephone Encounter (Signed)
 Inbound call from patient, requesting famotidine to be refilled.

## 2023-04-08 NOTE — Telephone Encounter (Signed)
Prescription refill sent to patient's pharmacy

## 2023-04-12 MED ORDER — DEXLANSOPRAZOLE 60 MG PO CPDR: 60.0000 mg | DELAYED_RELEASE_CAPSULE | Freq: Every day | ORAL | 0 refills | Status: DC

## 2023-04-12 NOTE — Telephone Encounter (Signed)
 Patient requesting medication refill for dexilant. States he is out and will need the prescription over the weekend. Please advise.   Thank you

## 2023-04-12 NOTE — Telephone Encounter (Signed)
 Prescription sent to patient's pharmacy.

## 2023-04-12 NOTE — Addendum Note (Signed)
 Addended by: Jovita Kussmaul L on: 04/12/2023 10:04 AM   Modules accepted: Orders

## 2023-04-15 ENCOUNTER — Ambulatory Visit: Payer: Medicare Other | Admitting: Gastroenterology

## 2023-04-15 ENCOUNTER — Encounter: Payer: Self-pay | Admitting: Gastroenterology

## 2023-04-15 VITALS — BP 136/80 | HR 72 | Ht 68.0 in | Wt 211.4 lb

## 2023-04-15 DIAGNOSIS — K219 Gastro-esophageal reflux disease without esophagitis: Secondary | ICD-10-CM

## 2023-04-15 DIAGNOSIS — K5900 Constipation, unspecified: Secondary | ICD-10-CM | POA: Diagnosis not present

## 2023-04-15 DIAGNOSIS — K429 Umbilical hernia without obstruction or gangrene: Secondary | ICD-10-CM

## 2023-04-15 DIAGNOSIS — K2270 Barrett's esophagus without dysplasia: Secondary | ICD-10-CM

## 2023-04-15 NOTE — Patient Instructions (Signed)
 _______________________________________________________  If your blood pressure at your visit was 140/90 or greater, please contact your primary care physician to follow up on this. _______________________________________________________  If you are age 69 or older, your body mass index should be between 23-30. Your Body mass index is 32.14 kg/m. If this is out of the aforementioned range listed, please consider follow up with your Primary Care Provider. ________________________________________________________  The [facility] providers would like to encourage you to use MYCHART to communicate with providers for non-urgent requests or questions.  Due to long hold times on the telephone, sending your provider a message by [facility] may be a faster and more efficient way to get a response.  Please allow 48 business hours for a response.  Please remember that this is for non-urgent requests.  ______________________________________________________  CONTINUE: Dexilant  and famotidine   Thank you for entrusting me with your care and choosing [facility].  Dr Stacia

## 2023-04-15 NOTE — Progress Notes (Signed)
 Discussed the use of AI scribe software for clinical note transcription with the patient, who gave verbal consent to proceed  HPI : Ryan Smith is a 68 y.o. male with a history of GERD and Barrett's esophagus who presents for follow-up.  He was previously followed by Dr. Jakie, and then Dr. Aneita. His last EGD and colonoscopy were in November 2023.  His EGD showed changes consistent with short segment Barrett's, confirmed with biopsies.  He was recommended to repeat EGD in 5 years.  His colonoscopy was notable for two 8 mm polyps in the transverse colon, 1 of which was hyperplastic and the other was a tubular adenoma.  He was recommended to repeat colonoscopy in 7 years.  He has a history of a sigmoid colectomy in 2009 secondary to complicated diverticulitis.  He has been on Dexilant  for several years due to its effectiveness in managing his reflux symptoms. He reports that prior to Dexilant , he tried Nexium and other medications, but these were not as effective. The patient's primary symptom was heartburn and acid regurgitation, which has been well controlled with Dexilant  and famotidine . He also implemented lifestyle modifications such as sleeping with the head of the bed elevated and sleeping on his left side, which have contributed to symptom control. The patient has not experienced reflux symptoms for over a year.  The patient also reports a history of constipation, which he manages with Miralax as needed. He has an umbilical hernia, which is not painful and is reducible.   The patient's last upper endoscopy was in November 2023, which showed stable Barrett's esophagus without dysplasia..        EGD Nov 2023 - Esophageal mucosal changes secondary to established short-segment Barrett's disease. Biopsied.  - Small hiatal hernia.  - Multiple gastric polyps.   Colonoscopy Nov 2023 - Two 8 mm polyps in the transverse colon, removed with a cold snare. Resected and retrieved.  - Patent  end-to-end colo-colonic anastomosis, characterized by healthy appearing mucosa.  - Mild diverticulosis in the left colon.  - Internal hemorrhoids.  - The examination was otherwise normal on direct and retroflexion views   1. Surgical [P], colon, transverse, polyp (2) - TUBULAR ADENOMA - NEGATIVE FOR HIGH-GRADE DYSPLASIA OR MALIGNANCY - HYPERPLASTIC POLYP 2. Surgical [P], distal esophagus - SQUAMOCOLUMNAR MUCOSA WITH REACTIVE CHANGES, CHRONIC INFLAMMATION AND INTESTINAL METAPLASIA - NEGATIVE FOR DYSPLASIA OR MALIGNANCY - SEE NOTE Diagnosis Note 2. With the proper endoscopic findings this is diagnostic of Barrett esophagus.- Normal duodenal bulb and second portion of the duodenum   Recommended to repeat EGD in 5 years and Colonoscopy in 7 years  Past Medical History:  Diagnosis Date   Abnormal LFTs    2008 neg. hep. serology, (-) ANA, ceruloplasmin, etc.;  liver biopsy showed  minimal active inflammation   Abnormal LFTs (liver function tests) 08/15/2010   increased LFTs: 2008 neg. hep. serology, (-) ANA, ceruloplasmin, etc.; liver biopsy showed minimal active inflammation     Anxiety    Anxiety state 08/30/2006   Qualifier: Diagnosis of  By: Lorane Browning      Atherosclerosis of native arteries of the extremities with intermittent claudication 11/30/2013   Barrett's esophagus    BCC (basal cell carcinoma of skin)    Cerumen impaction 03/03/2014   Claudication (HCC) 11/18/2013   COMMON MIGRAINE 08/30/2006   Qualifier: Diagnosis of  By: Lorane Browning     Diverticulosis 08/15/2010   Elevated PSA    Esophageal reflux    GERD (gastroesophageal  reflux disease) 08/15/2010   Hearing loss    has hearing aids   Hiatal hernia    HIATAL HERNIA WITH REFLUX 12/21/2009   Qualifier: Diagnosis of  By: Kowalk CMA (AAMA), Chick     History of diverticulitis of colon    Hx of adenomatous colonic polyps 08/15/2010   Hyperlipidemia 10/03/2011   Hypertension, essential 07/22/2019   INSOMNIA  08/30/2006   Qualifier: Diagnosis of  By: Lorane Browning     LOSS, HEARING NOS 08/30/2006   Qualifier: Diagnosis of  By: Lorane Browning     Other specified disorders of liver 11/15/2009   Qualifier: Diagnosis of  Problem Stop Reason:  By: Jakie MD NOLIA Alm SAUNDERS    Personal history of colonic polyps    tublar adenomas 2008 & hyerplastic 2011   Preop cardiovascular exam 01/19/2014   PVD (peripheral vascular disease) (HCC)    PVD (peripheral vascular disease) with claudication (HCC) 01/04/2014   SCC (squamous cell carcinoma)    L shoulder   Tobacco use 11/24/2013     Past Surgical History:  Procedure Laterality Date   ABDOMINAL AORTAGRAM N/A 11/25/2013   Procedure: ABDOMINAL EZELLA;  Surgeon: Deatrice DELENA Cage, MD;  Location: [facility];  Service: Cardiovascular;  Laterality: N/A;   COLONOSCOPY  02/07/2016   Laparoscopically-assisted Sigmoid Colectomy  11/10/2007   LIVER BIOPSY     minimal active inflammation   UPPER GASTROINTESTINAL ENDOSCOPY  02/07/2016   Family History  Problem Relation Age of Onset   Prostate cancer Father 58   Esophageal cancer Father    Coronary artery disease Father        dx age 44, CABG   Heart disease Father    Heart disease Paternal Uncle        x 2   Diverticulitis Mother    Colon cancer Neg Hx    Diabetes Neg Hx    Rectal cancer Neg Hx    Stomach cancer Neg Hx    Colon polyps Neg Hx    Social History   Tobacco Use   Smoking status: Former    Current packs/day: 0.00    Average packs/day: 0.3 packs/day for 40.0 years (10.0 ttl pk-yrs)    Types: Cigarettes    Start date: 02/26/1979    Quit date: 02/26/2019    Years since quitting: 4.1   Smokeless tobacco: Never   Tobacco comments:       Vaping Use   Vaping status: Never Used  Substance Use Topics   Alcohol use: Yes    Alcohol/week: 20.0 standard drinks of alcohol    Types: 20 Glasses of wine per week    Comment: 3-4 glasses of wine qhs   Drug use: No   Current Outpatient  Medications  Medication Sig Dispense Refill   ALPRAZolam  (XANAX ) 0.5 MG tablet TAKE 1 TABLET BY MOUTH TWICE A DAY 60 tablet 5   aspirin  EC 81 MG tablet Take 81 mg by mouth daily.     Coenzyme Q10 10 MG capsule Take 10 mg by mouth daily.     dexlansoprazole  (DEXILANT ) 60 MG capsule Take 1 capsule (60 mg total) by mouth daily. 90 capsule 0   ezetimibe  (ZETIA ) 10 MG tablet Take 1 tablet (10 mg total) by mouth daily. 90 tablet 3   famotidine  (PEPCID ) 40 MG tablet TAKE 1 TABLET BY MOUTH EVERYDAY AT BEDTIME 90 tablet 0   hydrochlorothiazide  (HYDRODIURIL ) 25 MG tablet Take 1 tablet (25 mg total) by mouth daily. 90 tablet 1  losartan  (COZAAR ) 25 MG tablet Take 1 tablet (25 mg total) by mouth daily. 90 tablet 2   Magnesium 500 MG CAPS Take 1 capsule by mouth at bedtime.     metoprolol  succinate (TOPROL -XL) 50 MG 24 hr tablet Take 1 tablet (50 mg total) by mouth daily. TAKE WITH OR IMMEDIATELY FOLLOWING A MEAL. 90 tablet 1   Orange Peel (D-LIMONENE) 1 g CAPS Take 1 g by mouth as needed.      psyllium (METAMUCIL) 58.6 % powder Take 1 packet by mouth at bedtime.     rosuvastatin  (CRESTOR ) 20 MG tablet Take 1 tablet (20 mg total) by mouth daily. 90 tablet 1   No current facility-administered medications for this visit.   No Known Allergies   Review of Systems: All systems reviewed and negative except where noted in HPI.    No results found.  Physical Exam: BP 136/80 (BP Location: Left Arm, Patient Position: Sitting, Cuff Size: Large)   Pulse 72   Ht 5' 8 (1.727 m)   Wt 211 lb 6 oz (95.9 kg)   BMI 32.14 kg/m  Constitutional: Pleasant,well-developed, Caucasian male in no acute distress. HEENT: Normocephalic and atraumatic. Conjunctivae are normal. No scleral icterus. Neck supple.  Cardiovascular: Normal rate, regular rhythm.  Pulmonary/chest: Effort normal and breath sounds normal. No wheezing, rales or rhonchi. Abdominal: Soft, nondistended, nontender. Bowel sounds active throughout. There  are no masses palpable. No hepatomegaly. Extremities: no edema Neurological: Alert and oriented to person place and time. Skin: Skin is warm and dry. No rashes noted. Psychiatric: Normal mood and affect. Behavior is normal.  CBC    Component Value Date/Time   WBC 7.8 08/07/2022 1319   RBC 5.38 08/07/2022 1319   HGB 16.8 08/07/2022 1319   HCT 50.5 08/07/2022 1319   PLT 439.0 (H) 08/07/2022 1319   MCV 93.9 08/07/2022 1319   MCHC 33.3 08/07/2022 1319   RDW 13.5 08/07/2022 1319   LYMPHSABS 2.1 08/07/2022 1319   MONOABS 0.8 08/07/2022 1319   EOSABS 0.2 08/07/2022 1319   BASOSABS 0.1 08/07/2022 1319    CMP     Component Value Date/Time   NA 139 09/18/2022 1048   K 5.1 09/18/2022 1048   CL 100 09/18/2022 1048   CO2 30 09/18/2022 1048   GLUCOSE 110 (H) 09/18/2022 1048   BUN 13 09/18/2022 1048   CREATININE 1.08 09/18/2022 1048   CALCIUM  9.7 09/18/2022 1048   PROT 6.7 06/22/2021 1446   ALBUMIN 4.7 06/22/2021 1446   AST 49 (H) 08/07/2022 1319   ALT 68 (H) 08/07/2022 1319   ALKPHOS 88 06/22/2021 1446   BILITOT 0.8 06/22/2021 1446   GFRNONAA 103.73 11/15/2009 1145   GFRAA 114 08/01/2007 1145       Latest Ref Rng & Units 08/07/2022    1:19 PM 06/22/2021    2:46 PM 04/13/2020    1:30 PM  CBC EXTENDED  WBC 4.0 - 10.5 K/uL 7.8  6.7  7.4   RBC 4.22 - 5.81 Mil/uL 5.38  5.24  5.33   Hemoglobin 13.0 - 17.0 g/dL 83.1  83.8  83.4   HCT 39.0 - 52.0 % 50.5  47.9  49.4   Platelets 150.0 - 400.0 K/uL 439.0  381.0  377.0   NEUT# 1.4 - 7.7 K/uL 4.7  3.7  4.2   Lymph# 0.7 - 4.0 K/uL 2.1  2.1  2.3       ASSESSMENT AND PLAN:  68 year old male with longstanding GERD and short segment nondysplastic Barrett's.  GERD symptoms currently controlled on Dexilant  and famotidine .  Barrett's Esophagus Barrett's esophagus managed with Dexilant  for several years. Last upper endoscopy in November 2023 showed no dysplasia. Symptom-free for over a year with current regimen. Discussed continuing PPI  therapy to prevent progression to esophageal cancer. Addressed concerns about long-term PPI use, noting recent studies do not support a significant risk of dementia. Discussed alternative PPIs and potential cost savings with other PPIs. Emphasized using the lowest effective dose to control symptoms.  Patient would like to continue his current regimen given that it has been working so well for him. - Continue Dexilant  in the morning - Consider trial of stopping famotidine  at night and monitor for reflux symptoms - If stable without famotidine , consider trial of a less expensive PPI (e.g., Prilosec or Nexium) at next refill  Constipation Intermittent constipation managed with Miralax. No significant issues reported. - Continue Miralax as needed  Umbilical Hernia Umbilical hernia noted. Reports it is not painful and can be reduced. No immediate intervention required unless symptoms change. - Monitor for changes in symptoms - Consider surgical consultation if hernia becomes painful or irreducible  General Health Maintenance Colonoscopy not needed until 2030. No other GI issues reported. - Schedule next colonoscopy for 2030.     Hussain Maimone E. Stacia, MD [facility]    Amon Aloysius BRAVO, MD

## 2023-04-26 ENCOUNTER — Other Ambulatory Visit: Payer: Self-pay | Admitting: Internal Medicine

## 2023-06-06 ENCOUNTER — Telehealth: Payer: Self-pay | Admitting: Internal Medicine

## 2023-06-06 NOTE — Telephone Encounter (Signed)
 Requesting: alprazolam 0.5mg   Contract: 01/30/22 UDS: 01/30/22 Last Visit: 09/26/22 Next Visit: 06/10/23 Last Refill: 12/13/22 #60 and 5RF   Please Advise

## 2023-06-06 NOTE — Telephone Encounter (Signed)
 PDMP okay, Rx sent

## 2023-06-08 ENCOUNTER — Other Ambulatory Visit: Payer: Self-pay | Admitting: Gastroenterology

## 2023-06-10 ENCOUNTER — Encounter: Payer: Medicare Other | Admitting: Internal Medicine

## 2023-07-05 ENCOUNTER — Encounter: Payer: Self-pay | Admitting: Internal Medicine

## 2023-07-07 NOTE — Telephone Encounter (Signed)
 Send semaglutide 0.25 mg 1 injection weekly. Dx obesity, HTN, hyperlipidemia, peripheral vascular disease.

## 2023-07-08 ENCOUNTER — Other Ambulatory Visit: Payer: Self-pay | Admitting: Internal Medicine

## 2023-07-08 MED ORDER — WEGOVY 0.25 MG/0.5ML ~~LOC~~ SOAJ: 0.2500 mg | SUBCUTANEOUS | 0 refills | Status: DC

## 2023-07-08 NOTE — Telephone Encounter (Signed)
 Rx sent

## 2023-07-08 NOTE — Addendum Note (Signed)
 Addended byConrad [place] D on: 07/08/2023 07:46 AM   Modules accepted: Orders

## 2023-07-10 ENCOUNTER — Other Ambulatory Visit: Payer: Self-pay | Admitting: Gastroenterology

## 2023-07-11 ENCOUNTER — Other Ambulatory Visit (HOSPITAL_COMMUNITY): Payer: Self-pay

## 2023-07-11 ENCOUNTER — Telehealth: Payer: Self-pay

## 2023-07-11 NOTE — Telephone Encounter (Signed)
 Per test claim, medication is not covered due to "Plan/benefit exclusion"

## 2023-07-11 NOTE — Telephone Encounter (Signed)
 Pharmacy Patient Advocate Encounter   Received notification from RX Request Messages that prior authorization for [facility] 0.25mg /0.81ml is required/requested.   Insurance verification completed.   The patient is insured through [employer] .   Per test claim: Product/Service not covered - Plan/Benefit Exclusion

## 2023-08-04 ENCOUNTER — Other Ambulatory Visit: Payer: Self-pay | Admitting: Internal Medicine

## 2023-08-06 DIAGNOSIS — L821 Other seborrheic keratosis: Secondary | ICD-10-CM | POA: Diagnosis not present

## 2023-08-06 DIAGNOSIS — C44229 Squamous cell carcinoma of skin of left ear and external auricular canal: Secondary | ICD-10-CM | POA: Diagnosis not present

## 2023-08-06 DIAGNOSIS — D485 Neoplasm of uncertain behavior of skin: Secondary | ICD-10-CM | POA: Diagnosis not present

## 2023-08-06 DIAGNOSIS — L570 Actinic keratosis: Secondary | ICD-10-CM | POA: Diagnosis not present

## 2023-08-06 DIAGNOSIS — D2262 Melanocytic nevi of left upper limb, including shoulder: Secondary | ICD-10-CM | POA: Diagnosis not present

## 2023-08-06 DIAGNOSIS — D2261 Melanocytic nevi of right upper limb, including shoulder: Secondary | ICD-10-CM | POA: Diagnosis not present

## 2023-08-06 DIAGNOSIS — Z85828 Personal history of other malignant neoplasm of skin: Secondary | ICD-10-CM | POA: Diagnosis not present

## 2023-08-06 DIAGNOSIS — C44529 Squamous cell carcinoma of skin of other part of trunk: Secondary | ICD-10-CM | POA: Diagnosis not present

## 2023-08-06 DIAGNOSIS — L820 Inflamed seborrheic keratosis: Secondary | ICD-10-CM | POA: Diagnosis not present

## 2023-08-06 DIAGNOSIS — D225 Melanocytic nevi of trunk: Secondary | ICD-10-CM | POA: Diagnosis not present

## 2023-08-12 ENCOUNTER — Ambulatory Visit (INDEPENDENT_AMBULATORY_CARE_PROVIDER_SITE_OTHER): Admitting: Internal Medicine

## 2023-08-12 ENCOUNTER — Encounter: Payer: Self-pay | Admitting: Internal Medicine

## 2023-08-12 ENCOUNTER — Other Ambulatory Visit: Payer: Self-pay

## 2023-08-12 VITALS — BP 132/80 | HR 61 | Temp 97.8°F | Resp 16 | Ht 68.0 in | Wt 205.5 lb

## 2023-08-12 DIAGNOSIS — F411 Generalized anxiety disorder: Secondary | ICD-10-CM

## 2023-08-12 DIAGNOSIS — E785 Hyperlipidemia, unspecified: Secondary | ICD-10-CM

## 2023-08-12 DIAGNOSIS — C61 Malignant neoplasm of prostate: Secondary | ICD-10-CM

## 2023-08-12 DIAGNOSIS — I10 Essential (primary) hypertension: Secondary | ICD-10-CM

## 2023-08-12 DIAGNOSIS — Z0001 Encounter for general adult medical examination with abnormal findings: Secondary | ICD-10-CM

## 2023-08-12 DIAGNOSIS — G4700 Insomnia, unspecified: Secondary | ICD-10-CM | POA: Diagnosis not present

## 2023-08-12 DIAGNOSIS — Z79899 Other long term (current) drug therapy: Secondary | ICD-10-CM

## 2023-08-12 DIAGNOSIS — Z0000 Encounter for general adult medical examination without abnormal findings: Secondary | ICD-10-CM

## 2023-08-12 DIAGNOSIS — Z23 Encounter for immunization: Secondary | ICD-10-CM | POA: Diagnosis not present

## 2023-08-12 DIAGNOSIS — R739 Hyperglycemia, unspecified: Secondary | ICD-10-CM

## 2023-08-12 NOTE — Progress Notes (Unsigned)
 Subjective:    Patient ID: Ryan Smith, male    DOB: 1955/07/04, 68 y.o.   MRN: [id number]  DOS:  08/12/2023 Type of visit - description: CPX  Here for CPX Diagnosed recently with prostate cancer. Denies any symptoms. Has umbilical hernia: Asymptomatic.   Review of Systems See above   Past Medical History:  Diagnosis Date   Abnormal LFTs    2008 neg. hep. serology, (-) ANA, ceruloplasmin, etc.;  liver biopsy showed  minimal active inflammation   Abnormal LFTs (liver function tests) 08/15/2010   increased LFTs: 2008 neg. hep. serology, (-) ANA, ceruloplasmin, etc.; liver biopsy showed minimal active inflammation     Anxiety    Anxiety state 08/30/2006   Qualifier: Diagnosis of  By: Zane Hews      Atherosclerosis of native arteries of the extremities with intermittent claudication 11/30/2013   Barrett's esophagus    BCC (basal cell carcinoma of skin)    Cerumen impaction 03/03/2014   Claudication (HCC) 11/18/2013   COMMON MIGRAINE 08/30/2006   Qualifier: Diagnosis of  By: Zane Hews     Diverticulosis 08/15/2010   Elevated PSA    Esophageal reflux    GERD (gastroesophageal reflux disease) 08/15/2010   Hearing loss    has hearing aids   Hiatal hernia    HIATAL HERNIA WITH REFLUX 12/21/2009   Qualifier: Diagnosis of  By: Celestia Colander CMA (AAMA), Leisha     History of diverticulitis of colon    Hx of adenomatous colonic polyps 08/15/2010   Hyperlipidemia 10/03/2011   Hypertension, essential 07/22/2019   INSOMNIA 08/30/2006   Qualifier: Diagnosis of  By: Zane Hews     LOSS, HEARING NOS 08/30/2006   Qualifier: Diagnosis of  By: Zane Hews     Other specified disorders of liver 11/15/2009   Qualifier: Diagnosis of  Problem Stop Reason:  By: Adan Holms MD Rito Chess    Personal history of colonic polyps    tublar adenomas 2008 & hyerplastic 2011   Preop cardiovascular exam 01/19/2014   Prostate cancer (HCC)    PVD (peripheral vascular disease)  (HCC)    PVD (peripheral vascular disease) with claudication (HCC) 01/04/2014   SCC (squamous cell carcinoma)    L shoulder   Tobacco use 11/24/2013    Past Surgical History:  Procedure Laterality Date   ABDOMINAL AORTAGRAM N/A 11/25/2013   Procedure: ABDOMINAL Tommi Fraise;  Surgeon: Wenona Hamilton, MD;  Location: [facility];  Service: Cardiovascular;  Laterality: N/A;   COLONOSCOPY  02/07/2016   Laparoscopically-assisted Sigmoid Colectomy  11/10/2007   LIVER BIOPSY     minimal active inflammation   UPPER GASTROINTESTINAL ENDOSCOPY  02/07/2016    Current Outpatient Medications  Medication Instructions   ALPRAZolam  (XANAX ) 0.5 mg, Oral, 2 times daily   aspirin  EC 81 mg, Daily   Coenzyme Q10 10 mg, Daily   D-Limonene 1 g, As needed   dexlansoprazole  (DEXILANT ) 60 mg, Oral, Daily   ezetimibe  (ZETIA ) 10 mg, Oral, Daily   famotidine  (PEPCID ) 40 MG tablet TAKE 1 TABLET BY MOUTH EVERYDAY AT BEDTIME   hydrochlorothiazide  (HYDRODIURIL ) 25 mg, Oral, Daily   losartan  (COZAAR ) 25 mg, Oral, Daily   Magnesium 500 MG CAPS 1 capsule, Daily at bedtime   metoprolol  succinate (TOPROL -XL) 50 mg, Oral, Daily, TAKE WITH OR IMMEDIATELY FOLLOWING A MEAL.   psyllium (METAMUCIL) 58.6 % powder 1 packet, Daily at bedtime   rosuvastatin  (CRESTOR ) 20 mg, Oral, Daily       Objective:  Physical Exam BP 132/80   Pulse 61   Temp 97.8 F (36.6 C) (Oral)   Resp 16   Ht 5\' 8"  (1.727 m)   Wt 205 lb 8 oz (93.2 kg)   SpO2 97%   BMI 31.25 kg/m  General: Well developed, NAD, BMI noted Neck: No  thyromegaly  HEENT:  Normocephalic . Face symmetric, atraumatic Lungs:  CTA B Normal respiratory effort, no intercostal retractions, no accessory muscle use. Heart: RRR,  no murmur.  Abdomen:  Not distended, soft, non-tender. No rebound or rigidity.  + Umbilical hernia, about 2.5 cm in size, reducible. Lower extremities: no pretibial edema bilaterally  Skin: Exposed areas without rash. Not pale. Not  jaundice Neurologic:  alert & oriented X3.  Speech normal, gait appropriate for age and unassisted Strength symmetric and appropriate for age.  Psych: Cognition and judgment appear intact.  Cooperative with normal attention span and concentration.  Behavior appropriate. No anxious or depressed appearing.     Assessment    Assessment HTN: dx 07/2019 (intolerant amlodipine , carvedilol , see OV 09/07/2019) Anxiety Hyperlipidemia  CV: --PVD R leg, dx 2015,   claudication 10-2013, ABIs showed obstruction, saw Dr Alvenia Aus, had a aortogram , was referred to surgery. They rx a CT chest show mild atherosclerosis disease of the aorta, coronary arthrosclerosis. Had a MRI of the right leg, see report. Was recommended a bypass but sx  improved, did not proceed. No benefit from cilostazol  --CT coronary morphology  10-2019:Mild to moderate, likely non-obstructive mixed CAD predominantly in the LAD,  GI:  --Barrett's, GERD, last EGD,BX : 02-2016  --Abnormal LFTs: 2008 neg. hep. serology, (-) ANA, ceruloplasmin, etc.;  liver bx:  minimal active inflammation Hearing loss, has aids  Sees dermatology  as off 05-2021 Prostate BX 12-2016 (-)  PLAN: Here for CPX - Td  05-2016  - pnm 23: 2016.  PNM 20 today -Vaccines I recommend: flu shot every fall.  Shingrix.  COVID booster is an option. --CCS: last colonoscopy 10/2009, polyp,   cscope 02-2016: + Polyps, C-scope November 2023, next per GI. --Prostate cancer: See comments under prostate cancer.  --Exercise-diet: Long discussion about the need to exercise 3 hours a week, eating healthier. --Lung cancer screening:   does not qualify under Medicare guidelines.   -- AAA  Screening (-) 02/2020 --Labs:  CMP FLP CBC A1c PSA - EtOH: Drinks daily, usually 3 to 4 glasses of wine nightly, again this year rec moderation, does not feel he has a problem with it. -Healthcare POA: See AVS   Will also address the following: Hyperglycemia: Check A1c HTN:BP looks very  good, recommend to check at home, continue HCTZ, losartan , metoprolol .  Checking labs Anxiety: Controlled on Xanax . High cholesterol: On Zetia , Crestor , checking labs. Nonobstructive CAD: ASX.  Prostate cancer: No history of increased PSA, eventually had a biopsy 12-2022, + prostate cancer, asymptomatic, was recommended conservative treatment for now, request a PSA.  Will do 1 faxed to urology.  Umbilical hernia: Discussed red flag symptoms that require immediate attention. RTC 6 months   Right shoulder injury: Impingement versus rotator cuff injury. Recommend Tylenol, icing, occasional ibuprofen. Refer to sports medicine.

## 2023-08-12 NOTE — Patient Instructions (Signed)
 INSTRUCTIONS  FOR TODAY   Check the  blood pressure regularly Blood pressure goal:  between 110/65 and  135/85. If it is consistently higher or lower, let me know     GO TO THE LAB : Get the blood work     Next office visit for a checkup in 6 months Please make an appointment before you leave today      "Health Care Power of attorney" (Also know as a  "Living will" or  Advance care planning documents)  If you already have a living will or healthcare power of attorney, is recommended you bring the copy to be scanned in your chart.   The document will be available to all the doctors you see in the system.  If you are over 69 y/o and don't have the document, please read:  Advance care planning is a process that supports adults in  understanding and sharing their preferences regarding future medical care.  The patient's preferences are recorded in documents called Advance Directives and the can be modified at any time while the patient is in full mental capacity.     More information at: [email or web address]

## 2023-08-13 ENCOUNTER — Encounter: Payer: Self-pay | Admitting: Internal Medicine

## 2023-08-13 LAB — COMPREHENSIVE METABOLIC PANEL WITH GFR
ALT: 76 U/L — ABNORMAL HIGH (ref 0–53)
AST: 57 U/L — ABNORMAL HIGH (ref 0–37)
Albumin: 4.6 g/dL (ref 3.5–5.2)
Alkaline Phosphatase: 109 U/L (ref 39–117)
BUN: 12 mg/dL (ref 6–23)
CO2: 27 meq/L (ref 19–32)
Calcium: 9.5 mg/dL (ref 8.4–10.5)
Chloride: 102 meq/L (ref 96–112)
Creatinine, Ser: 0.9 mg/dL (ref 0.40–1.50)
GFR: 87.71 mL/min (ref >60.00)
Glucose, Bld: 99 mg/dL (ref 70–99)
Potassium: 4.2 meq/L (ref 3.5–5.1)
Sodium: 140 meq/L (ref 135–145)
Total Bilirubin: 0.6 mg/dL (ref 0.2–1.2)
Total Protein: 6.9 g/dL (ref 6.0–8.3)

## 2023-08-13 LAB — CBC WITH DIFFERENTIAL/PLATELET
Basophils Absolute: 0.1 10*3/uL (ref 0.0–0.1)
Basophils Relative: 1.1 % (ref 0.0–3.0)
Eosinophils Absolute: 0.2 10*3/uL (ref 0.0–0.7)
Eosinophils Relative: 2.7 % (ref 0.0–5.0)
HCT: 49.7 % (ref 39.0–52.0)
Hemoglobin: 16.5 g/dL (ref 13.0–17.0)
Lymphocytes Relative: 27.6 % (ref 12.0–46.0)
Lymphs Abs: 1.9 10*3/uL (ref 0.7–4.0)
MCHC: 33.2 g/dL (ref 30.0–36.0)
MCV: 94.8 fl (ref 78.0–100.0)
Monocytes Absolute: 0.7 10*3/uL (ref 0.1–1.0)
Monocytes Relative: 9.5 % (ref 3.0–12.0)
Neutro Abs: 4.1 10*3/uL (ref 1.4–7.7)
Neutrophils Relative %: 59.1 % (ref 43.0–77.0)
Platelets: 431 10*3/uL — ABNORMAL HIGH (ref 150.0–400.0)
RBC: 5.24 Mil/uL (ref 4.22–5.81)
RDW: 13.9 % (ref 11.5–15.5)
WBC: 6.9 10*3/uL (ref 4.0–10.5)

## 2023-08-13 LAB — LIPID PANEL
Cholesterol: 141 mg/dL (ref 0–200)
HDL: 45.4 mg/dL (ref >39.00)
LDL Cholesterol: 64 mg/dL (ref 0–99)
NonHDL: 96.09
Total CHOL/HDL Ratio: 3
Triglycerides: 158 mg/dL — ABNORMAL HIGH (ref 0.0–149.0)
VLDL: 31.6 mg/dL (ref 0.0–40.0)

## 2023-08-13 LAB — HEMOGLOBIN A1C: Hgb A1c MFr Bld: 5.8 % (ref 4.6–6.5)

## 2023-08-13 LAB — PSA: PSA: 3.94 ng/mL (ref 0.10–4.00)

## 2023-08-13 NOTE — Assessment & Plan Note (Signed)
 Here for CPX - Td  05-2016  - pnm 23: 2016.  PNM 20 today -Vaccines I recommend: flu shot every fall.  Shingrix.  COVID booster is an option. --CCS: last colonoscopy 10/2009, polyp,   cscope 02-2016: + Polyps, C-scope November 2023, next per GI. -- h/o Prostate cancer  --Exercise-diet: Long discussion about the need to exercise 3 hours a week, eating healthier. --Lung cancer screening:   does not qualify under Medicare guidelines.   -- AAA  Screening (-) 02/2020 --Labs:  CMP FLP CBC A1c PSA - EtOH: Drinks daily, usually 3 to 4 glasses of wine nightly, again this year rec moderation, he doesn't  feel he has a problem with it. -Healthcare POA: See AVS

## 2023-08-13 NOTE — Assessment & Plan Note (Signed)
 Here for CPX  Will also address the following: Hyperglycemia: Check A1c HTN:BP looks very good, recommend to check at home, continue HCTZ, losartan , metoprolol .  Checking labs Anxiety: Controlled on Xanax . High cholesterol: On Zetia , Crestor , checking labs. Nonobstructive CAD: ASX. Prostate cancer: h/o increased PSA, eventually bx  12-2022, + prostate cancer, asymptomatic, was recommended conservative treatment for now, request a PSA.  Will do and fax  to urology. Umbilical hernia: Discussed red flag symptoms that require immediate attention. RTC 6 months

## 2023-08-14 LAB — DRUG MONITORING PANEL 375977 , URINE
Alcohol Metabolites: POSITIVE ng/mL — AB (ref <500)
Alphahydroxyalprazolam: 103 ng/mL — ABNORMAL HIGH (ref <25)
Alphahydroxymidazolam: NEGATIVE ng/mL (ref <50)
Alphahydroxytriazolam: NEGATIVE ng/mL (ref <50)
Aminoclonazepam: NEGATIVE ng/mL (ref <25)
Amphetamines: NEGATIVE ng/mL (ref <500)
Barbiturates: NEGATIVE ng/mL (ref <300)
Benzodiazepines: POSITIVE ng/mL — AB (ref <100)
Cocaine Metabolite: NEGATIVE ng/mL (ref <150)
Desmethyltramadol: NEGATIVE ng/mL (ref <100)
Ethyl Glucuronide (ETG): 2099 ng/mL — ABNORMAL HIGH (ref <500)
Ethyl Sulfate (ETS): 1007 ng/mL — ABNORMAL HIGH (ref <100)
Hydroxyethylflurazepam: NEGATIVE ng/mL (ref <50)
Lorazepam: NEGATIVE ng/mL (ref <50)
Marijuana Metabolite: NEGATIVE ng/mL (ref <20)
Nordiazepam: NEGATIVE ng/mL (ref <50)
Opiates: NEGATIVE ng/mL (ref <100)
Oxazepam: NEGATIVE ng/mL (ref <50)
Oxycodone: NEGATIVE ng/mL (ref <100)
Temazepam: NEGATIVE ng/mL (ref <50)
Tramadol: NEGATIVE ng/mL (ref <100)

## 2023-08-14 LAB — DM TEMPLATE

## 2023-08-15 ENCOUNTER — Ambulatory Visit: Payer: Self-pay | Admitting: Internal Medicine

## 2023-08-15 ENCOUNTER — Encounter: Payer: Self-pay | Admitting: Internal Medicine

## 2023-09-07 ENCOUNTER — Other Ambulatory Visit: Payer: Self-pay | Admitting: Gastroenterology

## 2023-10-02 ENCOUNTER — Other Ambulatory Visit: Payer: Self-pay | Admitting: Gastroenterology

## 2023-10-30 ENCOUNTER — Other Ambulatory Visit: Payer: Self-pay | Admitting: Internal Medicine

## 2023-10-30 NOTE — Telephone Encounter (Signed)
 Requesting: alprazolam   Contract: 08/12/23 UDS: 08/12/23 Last Visit: 08/12/23 Next Visit: None Last Refill: 06/06/23 #60 and 4RF   Please Advise

## 2023-12-11 ENCOUNTER — Ambulatory Visit (INDEPENDENT_AMBULATORY_CARE_PROVIDER_SITE_OTHER): Admitting: *Deleted

## 2023-12-11 VITALS — Ht 68.0 in | Wt 200.0 lb

## 2023-12-11 DIAGNOSIS — Z0000 Encounter for general adult medical examination without abnormal findings: Secondary | ICD-10-CM

## 2023-12-11 NOTE — Progress Notes (Signed)
 Please attest this visit in the absence of patient primary care provider.    Subjective:   Ryan Smith is a 68 y.o. who presents for a Medicare Wellness preventive visit.  As a reminder, Annual Wellness Visits don't include a physical exam, and some assessments may be limited, especially if this visit is performed virtually. We may recommend an in-person follow-up visit with your provider if needed.  Visit Complete: Virtual I connected with  [name] on 12/11/23 by a audio enabled telemedicine application and verified that I am speaking with the correct person using two identifiers.  Patient Location: Home  Provider Location: Office/Clinic  I discussed the limitations of evaluation and management by telemedicine. The patient expressed understanding and agreed to proceed.  Vital Signs: Because this visit was a virtual/telehealth visit, some criteria may be missing or patient reported. Any vitals not documented were not able to be obtained and vitals that have been documented are patient reported.  VideoDeclined- This patient declined Librarian, academic. Therefore the visit was completed with audio only.  Persons Participating in Visit: Patient.  AWV Questionnaire: Yes: Patient Medicare AWV questionnaire was completed by the patient on 12/04/23; I have confirmed that all information answered by patient is correct and no changes since this date.  Cardiac Risk Factors include: advanced age (>75men, >38 women);male gender;hypertension;dyslipidemia;Other (see comment), Risk factor comments: prostate cancer, PVD     Objective:    Today's Vitals   12/11/23 0943  Weight: 200 lb (90.7 kg)  Height: 5' 8 (1.727 m)   Body mass index is 30.41 kg/m.     12/11/2023   10:02 AM 05/02/2022    1:39 PM 11/25/2013    7:10 AM  Advanced Directives  Does Patient Have a Medical Advance Directive? No No No   Would patient like information on creating a medical  advance directive? Yes (MAU/Ambulatory/Procedural Areas - Information given) No - Patient declined No - patient declined information      Data saved with a previous flowsheet row definition    Current Medications (verified) Outpatient Encounter Medications as of 12/11/2023  Medication Sig   ALPRAZolam  (XANAX ) 0.5 MG tablet TAKE 1 TABLET BY MOUTH TWICE A DAY   aspirin  EC 81 MG tablet Take 81 mg by mouth daily.   Coenzyme Q10 10 MG capsule Take 10 mg by mouth daily.   dexlansoprazole  (DEXILANT ) 60 MG capsule TAKE 1 CAPSULE BY MOUTH EVERY DAY   ezetimibe  (ZETIA ) 10 MG tablet Take 1 tablet (10 mg total) by mouth daily.   famotidine  (PEPCID ) 40 MG tablet TAKE 1 TABLET BY MOUTH EVERYDAY AT BEDTIME   hydrochlorothiazide  (HYDRODIURIL ) 25 MG tablet Take 1 tablet (25 mg total) by mouth daily.   losartan  (COZAAR ) 25 MG tablet Take 1 tablet (25 mg total) by mouth daily.   Magnesium 500 MG CAPS Take 1 capsule by mouth at bedtime.   metoprolol  succinate (TOPROL -XL) 50 MG 24 hr tablet Take 1 tablet (50 mg total) by mouth daily. TAKE WITH OR IMMEDIATELY FOLLOWING A MEAL.   psyllium (METAMUCIL) 58.6 % powder Take 1 packet by mouth at bedtime.   rosuvastatin  (CRESTOR ) 20 MG tablet Take 1 tablet (20 mg total) by mouth daily.   [DISCONTINUED] Afton Booty (D-LIMONENE) 1 g CAPS Take 1 g by mouth as needed.  (Patient not taking: Reported on 12/11/2023)   No facility-administered encounter medications on file as of 12/11/2023.    Allergies (verified) Patient has no known allergies.  History: Past Medical History:  Diagnosis Date   Abnormal LFTs (liver function tests) 08/15/2010   increased LFTs: 2008 neg. hep. serology, (-) ANA, ceruloplasmin, etc.; liver biopsy showed minimal active inflammation     Anxiety state 08/30/2006   Qualifier: Diagnosis of  By: Lorane Browning      Atherosclerosis of native arteries of the extremities with intermittent claudication 11/30/2013   Barrett's esophagus    BCC  (basal cell carcinoma of skin)    Cerumen impaction 03/03/2014   Claudication (HCC) 11/18/2013   COMMON MIGRAINE 08/30/2006   Qualifier: Diagnosis of  By: Lorane Browning     Diverticulosis 08/15/2010   Elevated PSA    Esophageal reflux    GERD (gastroesophageal reflux disease) 08/15/2010   Hearing loss    has hearing aids   Hiatal hernia    HIATAL HERNIA WITH REFLUX 12/21/2009   Qualifier: Diagnosis of  By: Kowalk CMA (AAMA), Leisha     History of diverticulitis of colon    Hx of adenomatous colonic polyps 08/15/2010   Hyperlipidemia 10/03/2011   Hypertension, essential 07/22/2019   INSOMNIA 08/30/2006   Qualifier: Diagnosis of  By: Lorane Browning     LOSS, HEARING NOS 08/30/2006   Qualifier: Diagnosis of  By: Lorane Browning     Other specified disorders of liver 11/15/2009   Qualifier: Diagnosis of  Problem Stop Reason:  By: Jakie MD NOLIA Alm SAUNDERS    Personal history of colonic polyps    tublar adenomas 2008 & hyerplastic 2011   Preop cardiovascular exam 01/19/2014   Prostate cancer (HCC)    PVD (peripheral vascular disease) (HCC)    PVD (peripheral vascular disease) with claudication (HCC) 01/04/2014   SCC (squamous cell carcinoma)    L shoulder   SCC (squamous cell carcinoma)    Tobacco use 11/24/2013   Past Surgical History:  Procedure Laterality Date   ABDOMINAL AORTAGRAM N/A 11/25/2013   Procedure: ABDOMINAL EZELLA;  Surgeon: Deatrice DELENA Cage, MD;  Location: [facility] CATH [facility];  Service: Cardiovascular;  Laterality: N/A;   COLONOSCOPY  02/07/2016   Laparoscopically-assisted Sigmoid Colectomy  11/10/2007   LIVER BIOPSY     minimal active inflammation   UPPER GASTROINTESTINAL ENDOSCOPY  02/07/2016   Family History  Problem Relation Age of Onset   Prostate cancer Father 59   Esophageal cancer Father    Coronary artery disease Father        dx age 72, CABG   Heart disease Father    Heart disease Paternal Uncle        x 2   Diverticulitis Mother    Colon  cancer Neg Hx    Diabetes Neg Hx    Rectal cancer Neg Hx    Stomach cancer Neg Hx    Colon polyps Neg Hx    Social History   Socioeconomic History   Marital status: Married    Spouse name: Not on file   Number of children: 2   Years of education: Not on file   Highest education level: Bachelor's degree (e.g., BA, AB, BS)  Occupational History   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, [employer]    Employer: [employer]  Tobacco Use   Smoking status: Former    Current packs/day: 0.00    Average packs/day: 0.3 packs/day for 40.0 years (10.0 ttl pk-yrs)    Types: Cigarettes    Start date: 02/26/1979    Quit date: 02/26/2019    Years since quitting: 4.7   Smokeless tobacco: Never  Tobacco comments:       Vaping Use   Vaping status: Never Used  Substance and Sexual Activity   Alcohol use: Yes    Alcohol/week: 20.0 standard drinks of alcohol    Types: 20 Glasses of wine per week    Comment: 3-4 glasses of wine qhs   Drug use: No   Sexual activity: Not on file  Other Topics Concern   Not on file  Social History Narrative   Born in [place]   Married, 2 step sons (adults)   Social Drivers of Corporate investment banker Strain: Low Risk  (12/04/2023)   Overall Financial Resource Strain (CARDIA)    Difficulty of Paying Living Expenses: Not hard at all  Food Insecurity: No Food Insecurity (12/04/2023)   Hunger Vital Sign    Worried About Running Out of Food in the Last Year: Never true    Ran Out of Food in the Last Year: Never true  Transportation Needs: No Transportation Needs (12/04/2023)   PRAPARE - Administrator, Civil Service (Medical): No    Lack of Transportation (Non-Medical): No  Physical Activity: Sufficiently Active (12/04/2023)   Exercise Vital Sign    Days of Exercise per Week: 6 days    Minutes of Exercise per Session: 30 min  Stress: No Stress Concern Present (12/11/2023)   [employer] of Occupational Health - Occupational Stress  Questionnaire    Feeling of Stress: Not at all  Social Connections: Moderately Isolated (12/04/2023)   Social Connection and Isolation Panel    Frequency of Communication with Friends and Family: Twice a week    Frequency of Social Gatherings with Friends and Family: Once a week    Attends Religious Services: Never    Database administrator or Organizations: No    Attends Engineer, structural: Not on file    Marital Status: Married    Tobacco Counseling Counseling given: Not Answered Tobacco comments:      Clinical Intake:  Pre-visit preparation completed: Yes  Pain : No/denies pain     BMI - recorded: 30.41 Nutritional Status: BMI > 30  Obese Nutritional Risks: None Diabetes: No  Lab Results  Component Value Date   HGBA1C 5.8 08/12/2023   HGBA1C 5.9 08/07/2022   HGBA1C 5.8 06/22/2021     How often do you need to have someone help you when you read instructions, pamphlets, or other written materials from your doctor or pharmacy?: 1 - Never What is the last grade level you completed in school?: Bachelor's degree  Interpreter Needed?: No  Information entered by :: Lolita Libra, CMA (AAMA)   Activities of Daily Living     12/04/2023    8:19 AM  In your present state of health, do you have any difficulty performing the following activities:  Hearing? 1  Vision? 0  Difficulty concentrating or making decisions? 0  Walking or climbing stairs? 0  Dressing or bathing? 0  Doing errands, shopping? 0  Preparing Food and eating ? N  Using the Toilet? N  In the past six months, have you accidently leaked urine? N  Do you have problems with loss of bowel control? N  Managing your Medications? N  Managing your Finances? N  Housekeeping or managing your Housekeeping? N    Patient Care Team: Amon Aloysius BRAVO, MD as PCP - General (Internal Medicine) Darron Deatrice LABOR, MD as Consulting Physician (Cardiology) McKenzie, Belvie CROME, MD as Consulting Physician  (Urology) Joshua,  Toribio, MD as Consulting Physician (Dermatology) P.A., [facility] (Optometry)  I have updated your Care Teams any recent Medical Services you may have received from other providers in the past year.     Assessment:   This is a routine wellness examination for Vidit.  Hearing/Vision screen Hearing Screening - Comments:: Wears hearing aids Vision Screening - Comments:: Up to date with routine eye exams with Dr Francella    Goals Addressed             This Visit's Progress    lose weight   On track      Depression Screen     12/11/2023    9:50 AM 08/12/2023    2:14 PM 09/26/2022   11:47 AM 08/07/2022   12:54 PM 05/02/2022    1:38 PM 01/30/2022    1:20 PM 06/22/2021    1:58 PM  PHQ 2/9 Scores  PHQ - 2 Score 0 0 0 0 0 0 0  PHQ- 9 Score 0          Fall Risk     12/04/2023    8:19 AM 08/12/2023    2:13 PM 09/26/2022   11:46 AM 08/07/2022   12:54 PM 05/01/2022    9:10 PM  Fall Risk   Falls in the past year? 0 0 0 0 0  Number falls in past yr: 0 0 0 0 0  Injury with Fall? 0 0 0 0 0  Risk for fall due to : No Fall Risks    No Fall Risks;Impaired vision  Follow up Education provided Falls evaluation completed;Education provided Falls evaluation completed Falls evaluation completed Falls prevention discussed    MEDICARE RISK AT HOME:  Medicare Risk at Home Any stairs in or around the home?: (Patient-Rptd) Yes If so, are there any without handrails?: (Patient-Rptd) No Home free of loose throw rugs in walkways, pet beds, electrical cords, etc?: (Patient-Rptd) Yes Adequate lighting in your home to reduce risk of falls?: (Patient-Rptd) Yes Life alert?: (Patient-Rptd) No Use of a cane, walker or w/c?: (Patient-Rptd) No Grab bars in the bathroom?: (Patient-Rptd) No Shower chair or bench in shower?: (Patient-Rptd) Yes Elevated toilet seat or a handicapped toilet?: (Patient-Rptd) Yes  TIMED UP AND GO:  Was the test performed?  No,  audio  Cognitive Function: 6CIT completed        12/11/2023    9:52 AM 05/02/2022    1:41 PM  6CIT Screen  What Year? 0 points 0 points  What month? 0 points 0 points  What time? 0 points 0 points  Count back from 20 0 points 0 points  Months in reverse 0 points 0 points  Repeat phrase 0 points 0 points  Total Score 0 points 0 points    Immunizations Immunization History  Administered Date(s) Administered   Fluad Quad(high Dose 65+) 01/27/2020, 12/29/2020, 01/22/2022   INFLUENZA, HIGH DOSE SEASONAL PF 04/09/2023   Influenza Split 01/14/2018   Influenza Whole 01/19/2010   Influenza,inj,Quad PF,6+ Mos 12/26/2018   Influenza-Unspecified 12/31/2012, 01/26/2014, 12/31/2016, 12/31/2017   Moderna Covid-19 Fall Seasonal Vaccine 47yrs & older 04/09/2023   PFIZER(Purple Top)SARS-COV-2 Vaccination 05/10/2019, 06/04/2019, 03/14/2020   PNEUMOCOCCAL CONJUGATE-20 08/12/2023   Pfizer Covid-19 Vaccine Bivalent Booster 57yrs & up 01/24/2021, 01/22/2022   Pneumococcal Polysaccharide-23 08/16/2014   Td 04/02/2005   Tdap 06/19/2016    Screening Tests Health Maintenance  Topic Date Due   Influenza Vaccine  11/01/2023   COVID-19 Vaccine (7 - Pfizer risk 2024-25 season)  12/02/2023   Zoster Vaccines- Shingrix (1 of 2) 12/10/2024 (Originally 01/09/1975)   Medicare Annual Wellness (AWV)  12/10/2024   DTaP/Tdap/Td (3 - Td or Tdap) 06/20/2026   Colonoscopy  03/01/2029   Pneumococcal Vaccine: 50+ Years  Completed   Hepatitis C Screening  Completed   HPV VACCINES  Aged Out   Meningococcal B Vaccine  Aged Out    Health Maintenance Items Addressed: Will get flu vaccine at upcoming OV. Undecided about Shingles vaccine.  Additional Screening:  Vision Screening: Recommended annual ophthalmology exams for early detection of glaucoma and other disorders of the eye. Is the patient up to date with their annual eye exam?  Yes  Who is the provider or what is the name of the office in which the patient  attends annual eye exams? [facility]  Dental Screening: Recommended annual dental exams for proper oral hygiene  Community Resource Referral / Chronic Care Management: CRR required this visit?  No   CCM required this visit?  No   Plan:    I have personally reviewed and noted the following in the patient's chart:   Medical and social history Use of alcohol, tobacco or illicit drugs  Current medications and supplements including opioid prescriptions. Patient is not currently taking opioid prescriptions. Functional ability and status Nutritional status Physical activity Advanced directives List of other physicians Hospitalizations, surgeries, and ER visits in previous 12 months Vitals Screenings to include cognitive, depression, and falls Referrals and appointments  In addition, I have reviewed and discussed with patient certain preventive protocols, quality metrics, and best practice recommendations. A written personalized care plan for preventive services as well as general preventive health recommendations were provided to patient.   Lolita Libra, CMA   12/11/2023   After Visit Summary: (MyChart) Due to this being a telephonic visit, the after visit summary with patients personalized plan was offered to patient via MyChart   Notes: Nothing significant to report at this time.

## 2023-12-11 NOTE — Patient Instructions (Addendum)
 Mr. Ryan Smith , Thank you for taking time out of your busy schedule to complete your Annual Wellness Visit with me. I enjoyed our conversation and look forward to speaking with you again next year. I, as well as your care team,  appreciate your ongoing commitment to your health goals. Please review the following plan we discussed and let me know if I can assist you in the future. Your Game plan/ To Do List     Follow up Visits: Next Medicare AWV with our clinical staff: 12/15/24 10:20am, telephone.  Next Office Visit with your provider: 02/12/24 1:20pm, Dr Amon. You may get your flu vaccine at this visit if not already received.  Clinician Recommendations:  Aim for 30 minutes of exercise or brisk walking, 6-8 glasses of water, and 5 servings of fruits and vegetables each day.       This is a list of the screening recommended for you and due dates:  Health Maintenance  Topic Date Due   Zoster (Shingles) Vaccine (1 of 2) Never done   Medicare Annual Wellness Visit  05/03/2023   Flu Shot  11/01/2023   COVID-19 Vaccine (7 - Pfizer risk 2024-25 season) 12/02/2023   DTaP/Tdap/Td vaccine (3 - Td or Tdap) 06/20/2026   Colon Cancer Screening  03/01/2029   Pneumococcal Vaccine for age over 58  Completed   Hepatitis C Screening  Completed   HPV Vaccine  Aged Out   Meningitis B Vaccine  Aged Out   Please let me know if you do not receive your Advanced Directive Packet in the mail within 1 week.  Advanced directives: (Provided) Advance directive discussed with you today. I have provided a copy for you to complete at home and have notarized. Once this is complete, please bring a copy in to our office so we can scan it into your chart.  Advance Care Planning is important because it:  [x]  Makes sure you receive the medical care that is consistent with your values, goals, and preferences  [x]  It provides guidance to your family and loved ones and reduces their decisional burden about whether or not they  are making the right decisions based on your wishes.  Follow the link provided in your after visit summary or read over the paperwork we have mailed to you to help you started getting your Advance Directives in place. If you need assistance in completing these, please reach out to us  so that we can help you!  See attachments for Preventive Care and Fall Prevention Tips.

## 2023-12-28 ENCOUNTER — Other Ambulatory Visit: Payer: Self-pay | Admitting: Gastroenterology

## 2024-01-25 ENCOUNTER — Other Ambulatory Visit: Payer: Self-pay | Admitting: Internal Medicine

## 2024-02-12 ENCOUNTER — Ambulatory Visit: Admitting: Internal Medicine

## 2024-03-24 ENCOUNTER — Encounter: Payer: Self-pay | Admitting: Internal Medicine

## 2024-03-24 MED ORDER — ALPRAZOLAM 0.5 MG PO TABS: 0.5000 mg | ORAL_TABLET | Freq: Two times a day (BID) | ORAL | 4 refills | Status: AC

## 2024-03-24 NOTE — Telephone Encounter (Signed)
 Requesting: alprazolam  0.5mg  Contract:08/12/23 UDS:08/12/23 Last Visit: 08/12/23 Next Visit: None Last Refill: 10/30/23 #60 and 4RF  Please Advise

## 2024-03-24 NOTE — Telephone Encounter (Signed)
PDMP reviewed, prescription sent 

## 2024-04-01 ENCOUNTER — Other Ambulatory Visit: Payer: Self-pay | Admitting: Gastroenterology

## 2024-04-14 ENCOUNTER — Other Ambulatory Visit: Payer: Self-pay | Admitting: Gastroenterology

## 2024-04-22 ENCOUNTER — Other Ambulatory Visit: Payer: Self-pay | Admitting: Internal Medicine

## 2024-04-23 ENCOUNTER — Other Ambulatory Visit: Payer: Self-pay | Admitting: Internal Medicine

## 2024-12-15 ENCOUNTER — Ambulatory Visit
